# Patient Record
Sex: Male | Born: 1980 | Race: Black or African American | Hispanic: No | Marital: Single | Smoking: Current every day smoker
Health system: Southern US, Community
[De-identification: ages and names within clinical notes are randomized; demographics above are authoritative.]

## PROBLEM LIST (undated history)

## (undated) DIAGNOSIS — I4891 Unspecified atrial fibrillation: Secondary | ICD-10-CM

## (undated) DIAGNOSIS — Z720 Tobacco use: Secondary | ICD-10-CM

## (undated) DIAGNOSIS — F1910 Other psychoactive substance abuse, uncomplicated: Secondary | ICD-10-CM

---

## 2003-01-25 ENCOUNTER — Emergency Department (HOSPITAL_COMMUNITY): Admission: EM | Admit: 2003-01-25 | Discharge: 2003-01-25 | Payer: Self-pay | Admitting: Emergency Medicine

## 2005-10-17 ENCOUNTER — Emergency Department (HOSPITAL_COMMUNITY): Admission: EM | Admit: 2005-10-17 | Discharge: 2005-10-18 | Payer: Self-pay | Admitting: Emergency Medicine

## 2005-10-18 IMAGING — CR DG CHEST 2V
2 series · 2 of 2 positions shown · non-contrast
Comparison: None.

CLINICAL DATA: Epigastric pain for 5 days.  
 CHEST - 2 VIEW:

[view not recorded (1 of 2)]
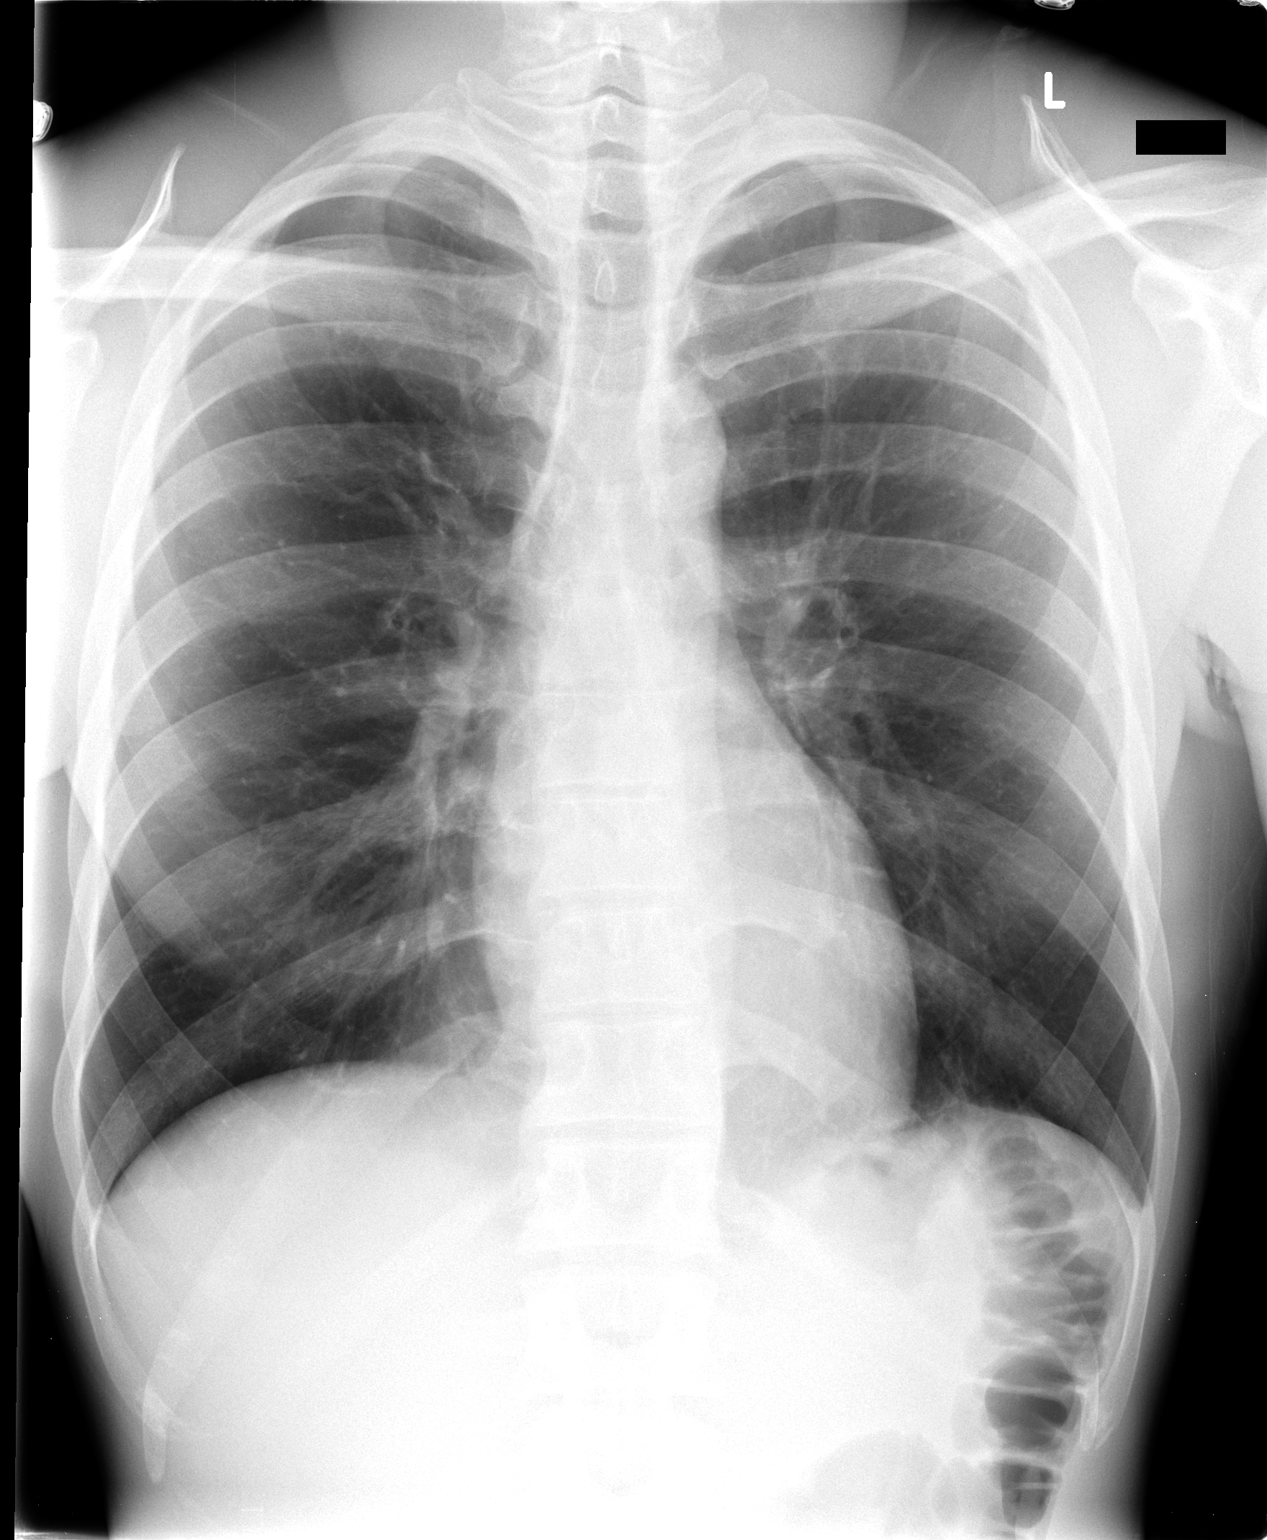

[view not recorded (2 of 2)]
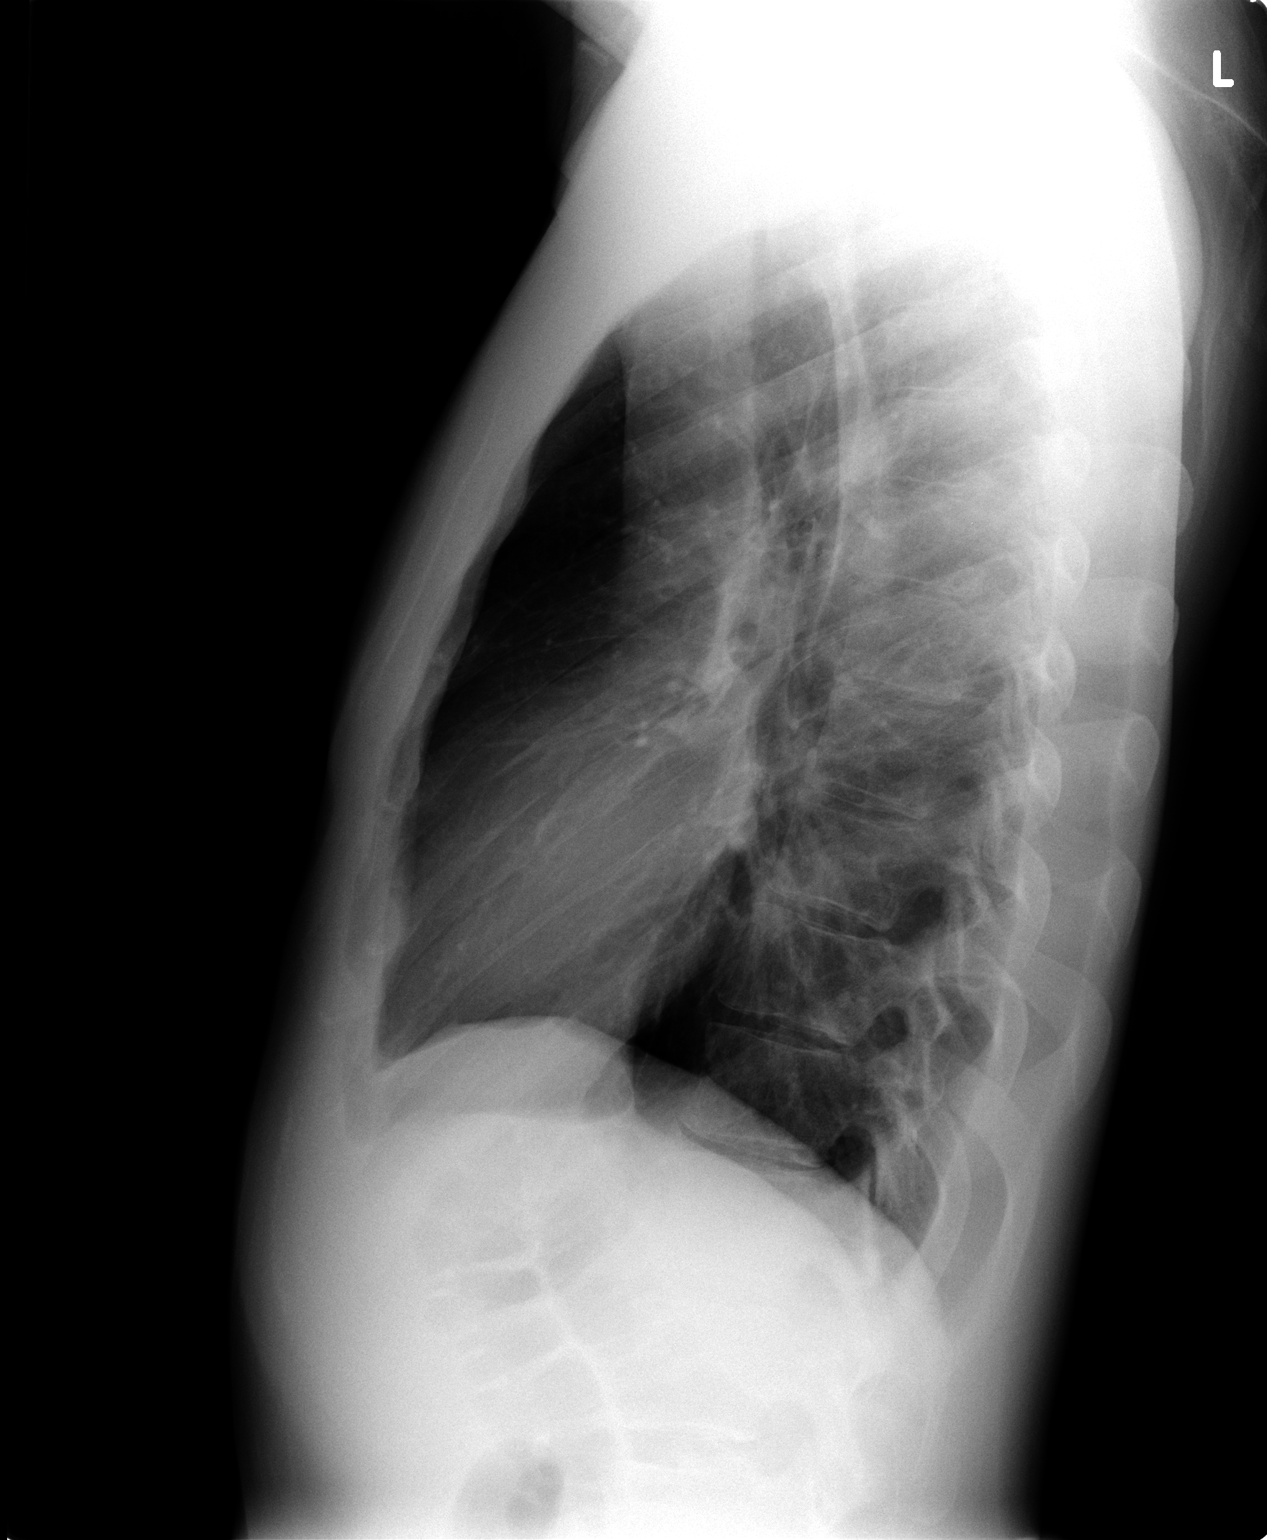

[2 of 2 positions shown; findings below may reference images not displayed]

FINDINGS: Midline trachea.  Heart size normal.  Mediastinal contours unremarkable.  Lungs are clear.  There is no pleural fluid.  No free intraperitoneal air.
IMPRESSION: No acute cardiopulmonary disease.

## 2005-10-18 IMAGING — US US ABDOMEN COMPLETE
1 series · 14 of 25 positions shown · non-contrast
Comparison: None.

CLINICAL DATA: Abdominal pain. 
 ABDOMEN ULTRASOUND:
TECHNIQUE: Complete abdominal ultrasound examination was performed including evaluation of the liver, gallbladder, bile ducts, pancreas, kidneys, spleen, IVC, and abdominal aorta.

[Series 1: abdomen · 0.27mm/px · 14 of 77 slices shown]
[im 1/77]
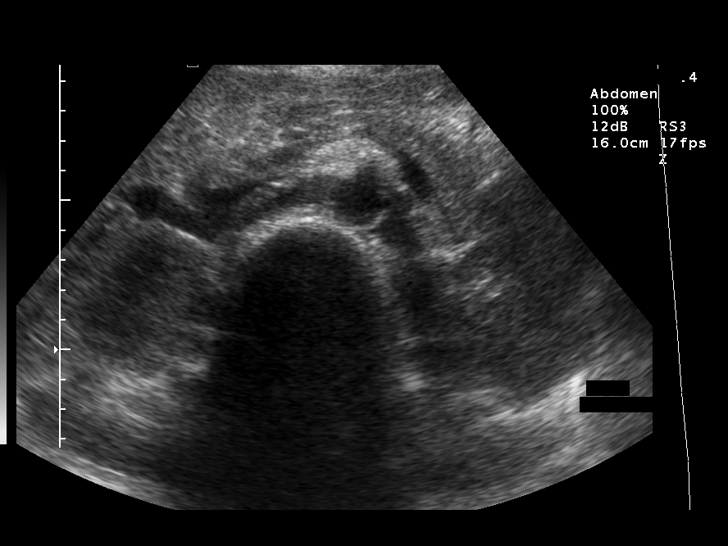
[im 7/77]
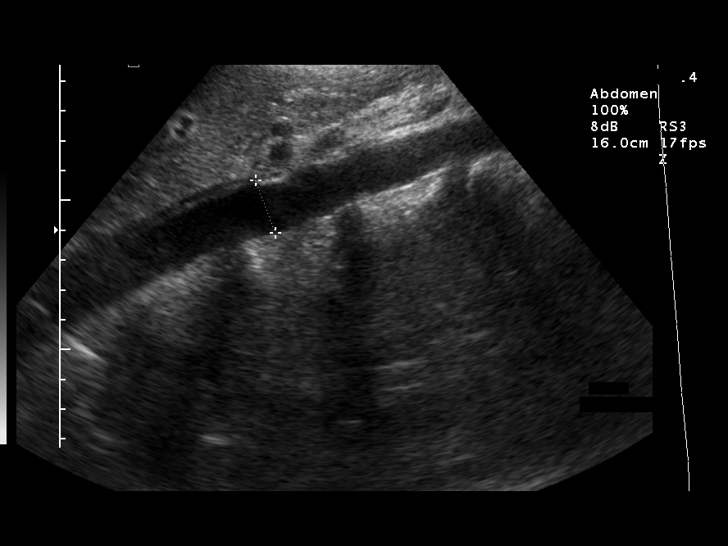
[im 13/77]
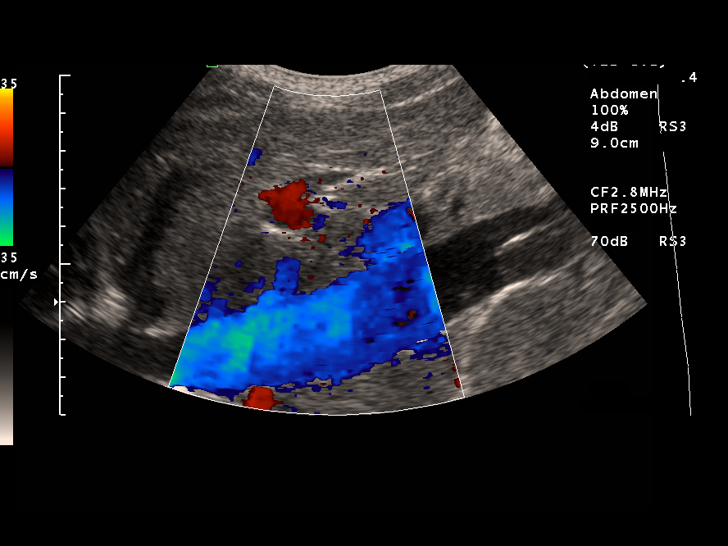
[im 20/77]
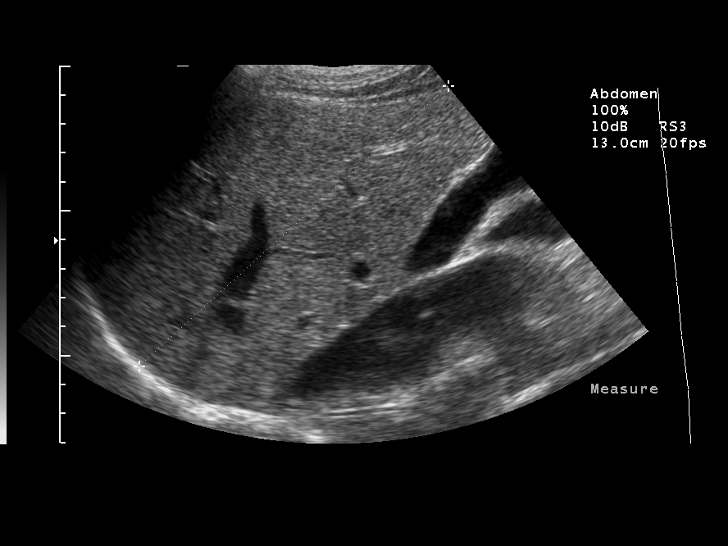
[im 26/77]
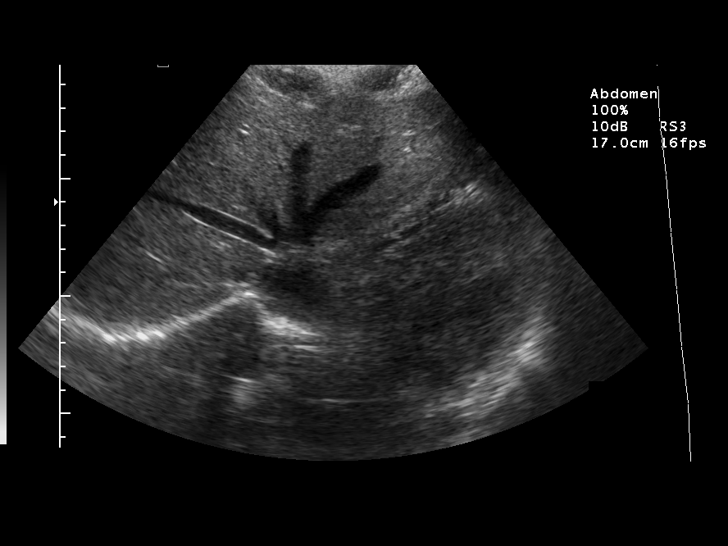
[im 29/77]
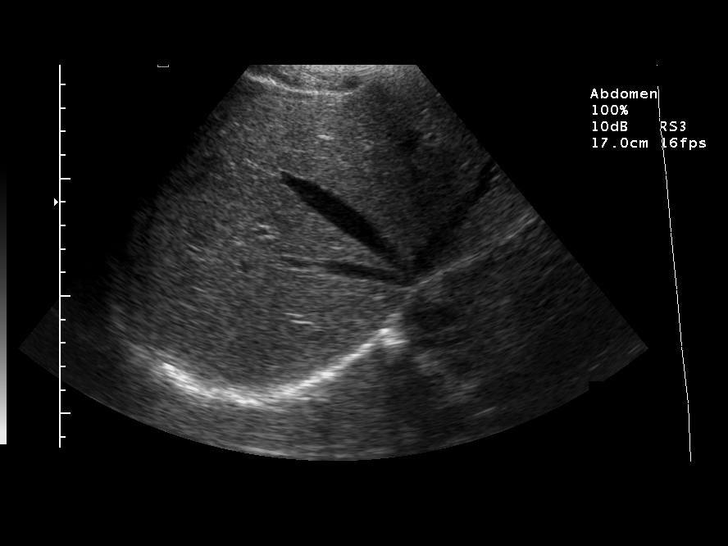
[im 35/77]
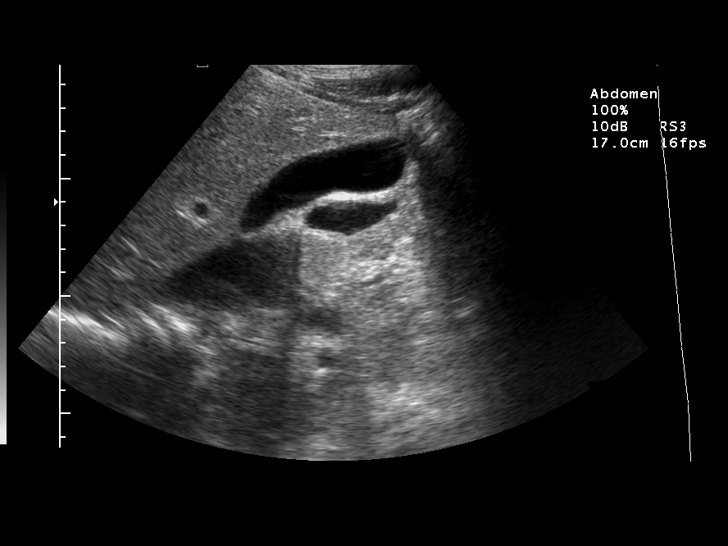
[im 42/77]
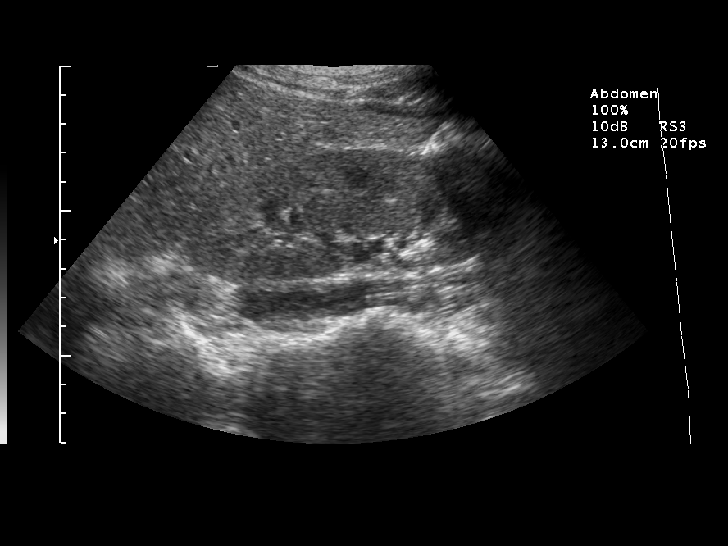
[im 48/77]
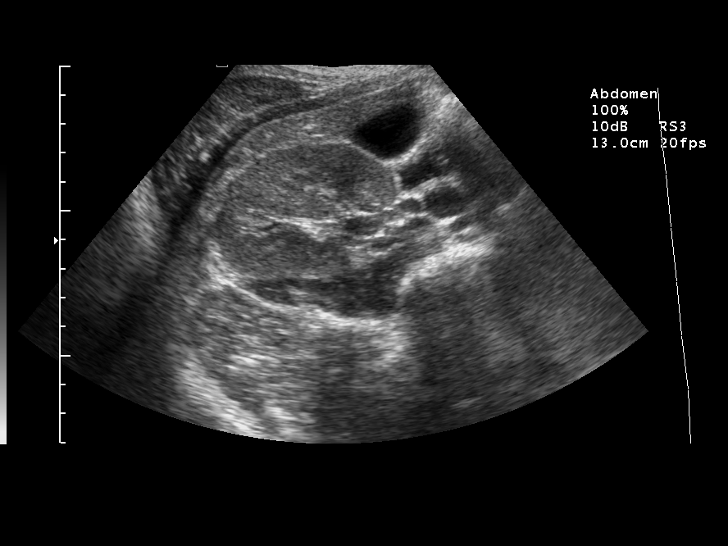
[im 51/77]
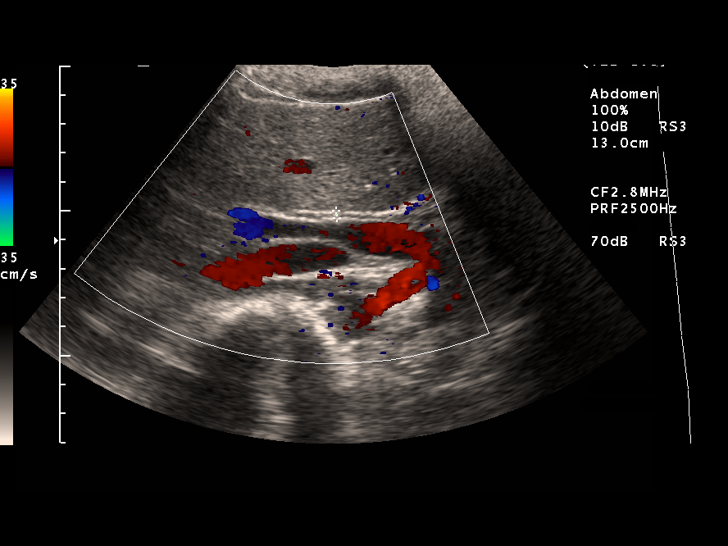
[im 58/77]
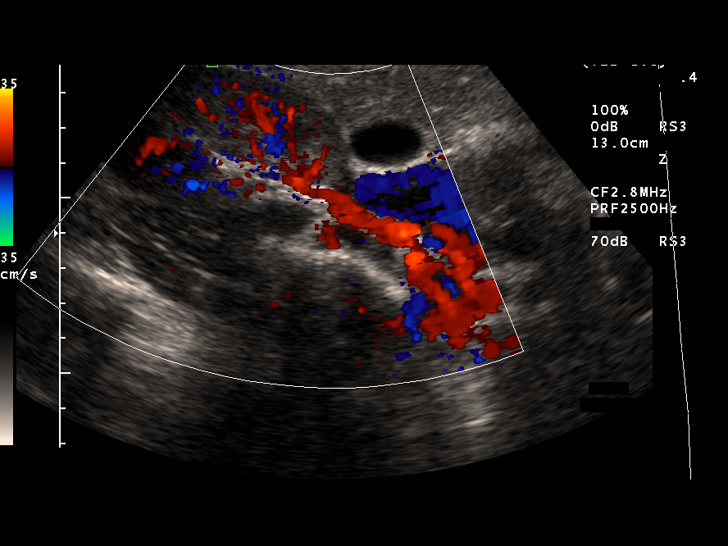
[im 64/77]
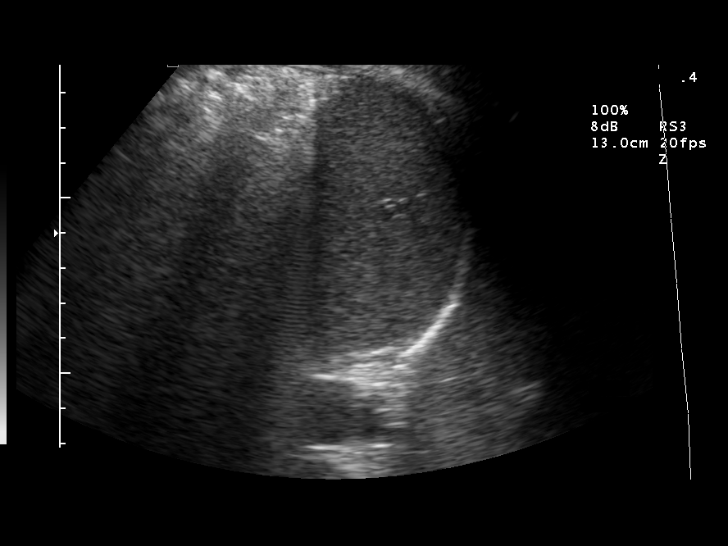
[im 70/77]
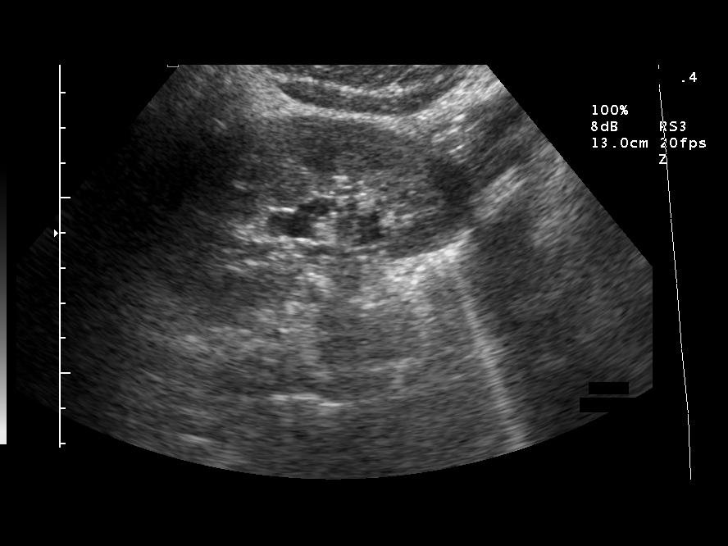
[im 77/77]
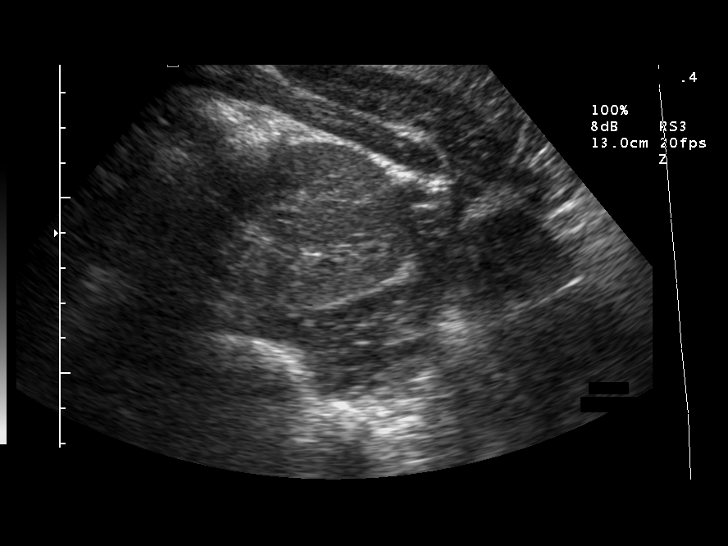

[14 of 25 positions shown; findings below may reference images not displayed]

FINDINGS: There is no evidence of gallstones or biliary ductal dilatation.  The liver is within normal limits in echogenicity, and no focal liver lesions are seen.  The visualized portions of the IVC and pancreas are unremarkable.
 There is no evidence of splenomegaly.  The kidneys are unremarkable, and there is no evidence of hydronephrosis.  The abdominal aorta is non-dilated.
IMPRESSION: Negative abdominal ultrasound.

## 2007-01-11 ENCOUNTER — Emergency Department (HOSPITAL_COMMUNITY): Admission: EM | Admit: 2007-01-11 | Discharge: 2007-01-11 | Payer: Self-pay | Admitting: Emergency Medicine

## 2007-01-11 IMAGING — CR DG CHEST 2V
2 series · 2 of 2 positions shown · non-contrast
Comparison: [DATE].

CLINICAL DATA: Left chest pain. 
 CHEST - 2 VIEW:

[view not recorded (1 of 2)]
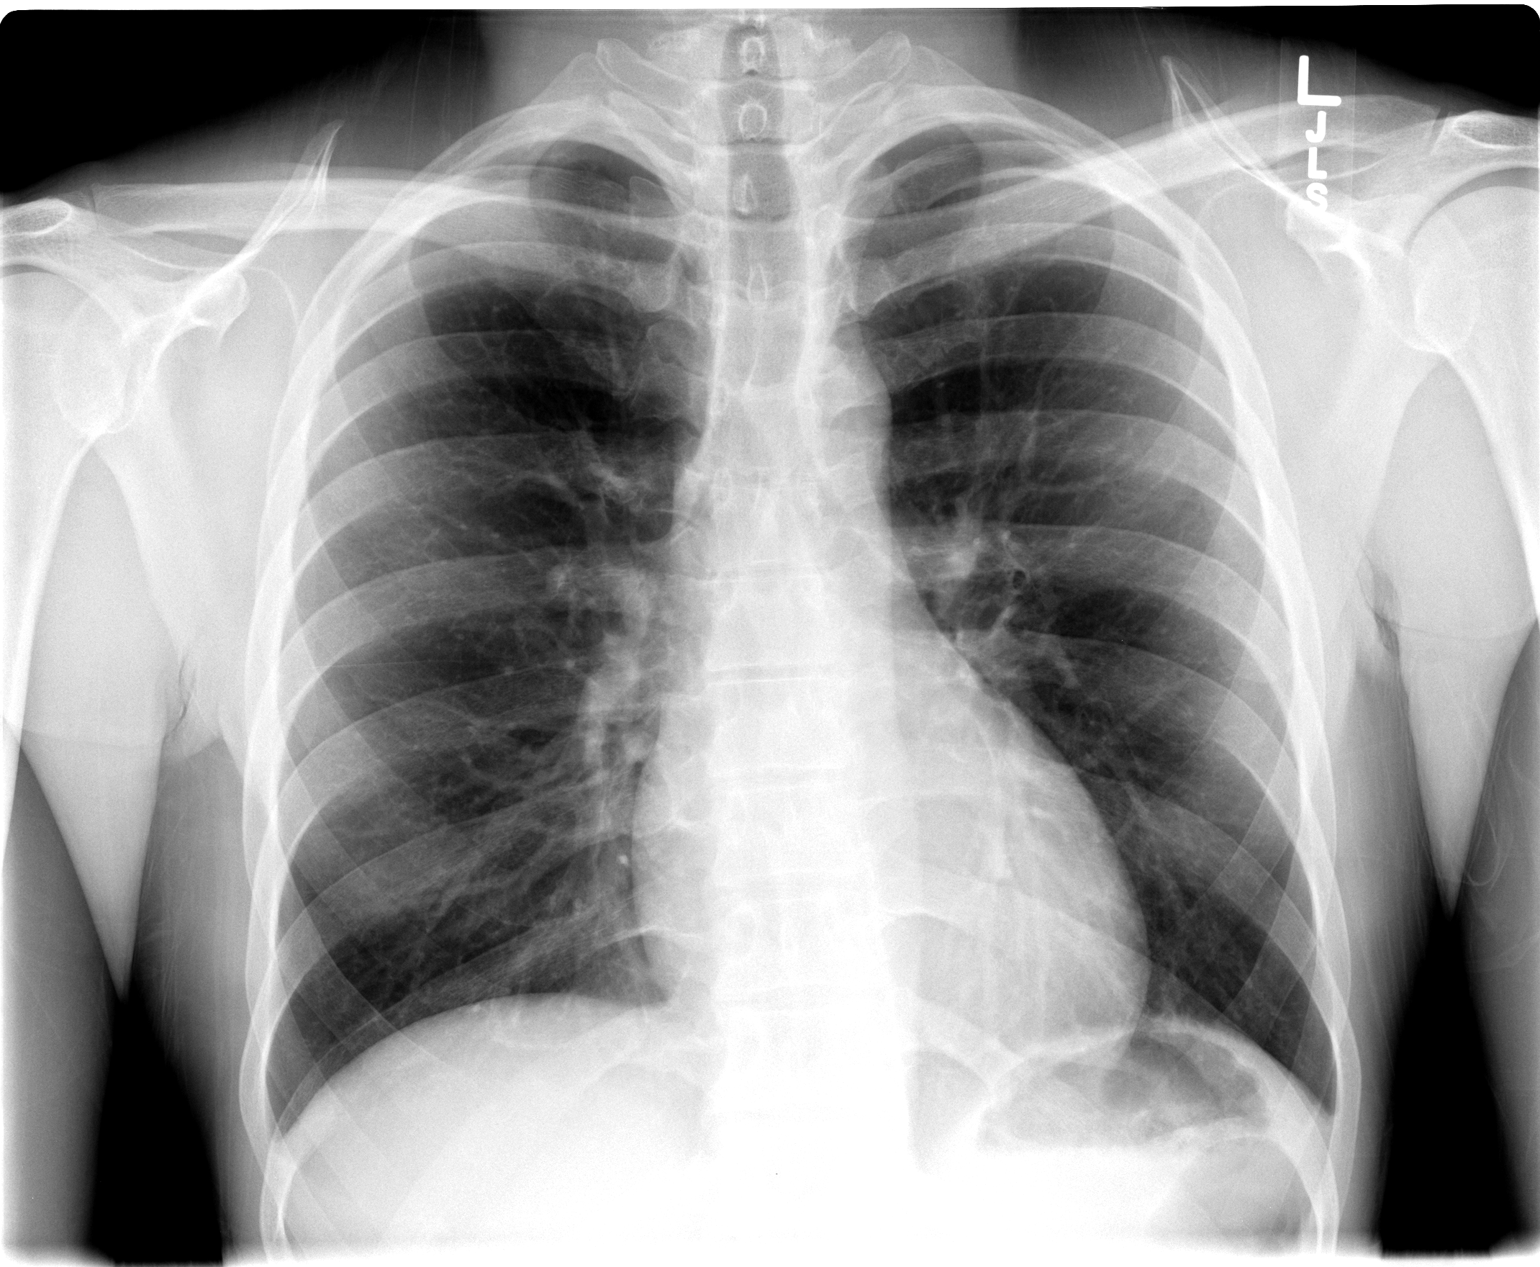

[view not recorded (2 of 2)]
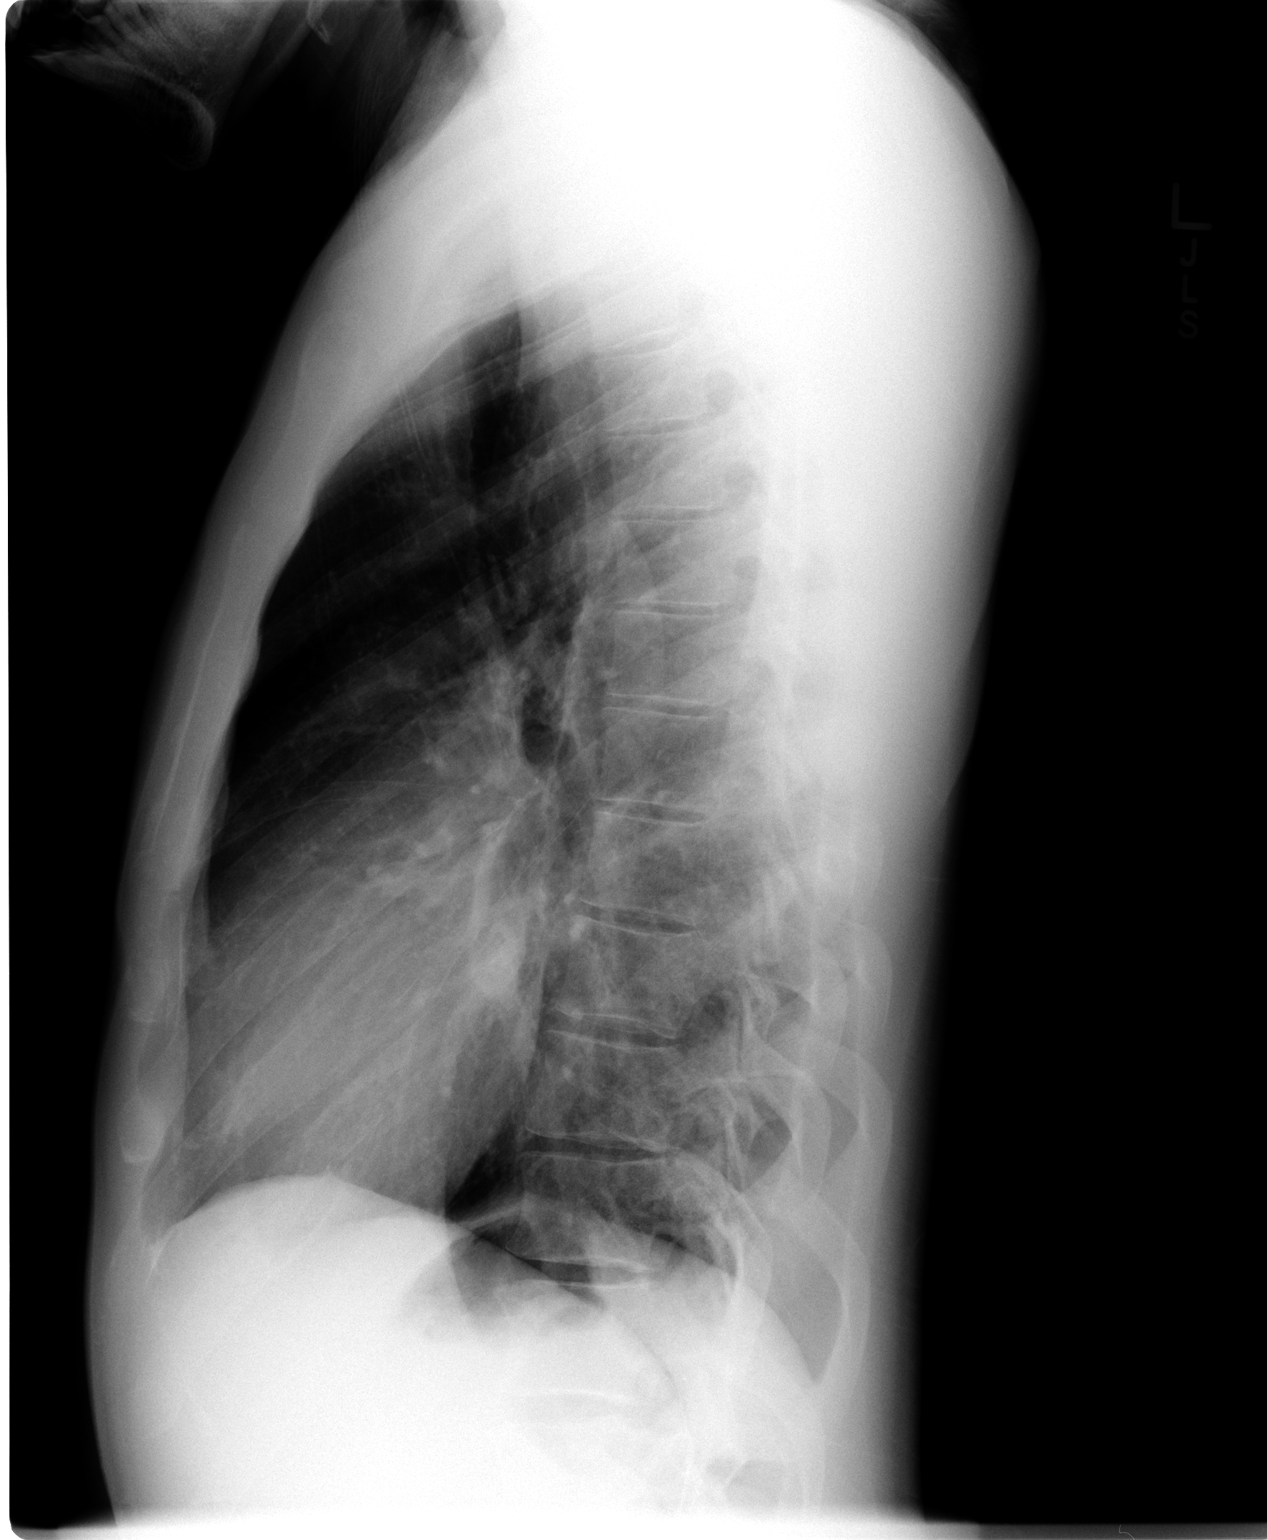

[2 of 2 positions shown; findings below may reference images not displayed]

FINDINGS: Heart and vascularity normal. There is mild peribronchial thickening without active airspace disease or pleural fluid.  No change since the prior study.
IMPRESSION: Peribronchial thickening ? no active disease.

## 2007-06-27 ENCOUNTER — Emergency Department (HOSPITAL_COMMUNITY): Admission: EM | Admit: 2007-06-27 | Discharge: 2007-06-27 | Payer: Self-pay | Admitting: Emergency Medicine

## 2007-06-27 ENCOUNTER — Ambulatory Visit (HOSPITAL_COMMUNITY): Admission: RE | Admit: 2007-06-27 | Discharge: 2007-06-27 | Payer: Self-pay | Admitting: Emergency Medicine

## 2007-06-27 IMAGING — CR DG SKULL 1-3V
2 series · 2 of 2 positions shown · non-contrast
Comparison: No priors for comparison.

CLINICAL DATA: Focal left frontal head pain.  No injury. 
 SKULL ? 2 VIEW:

[view not recorded (1 of 2)]
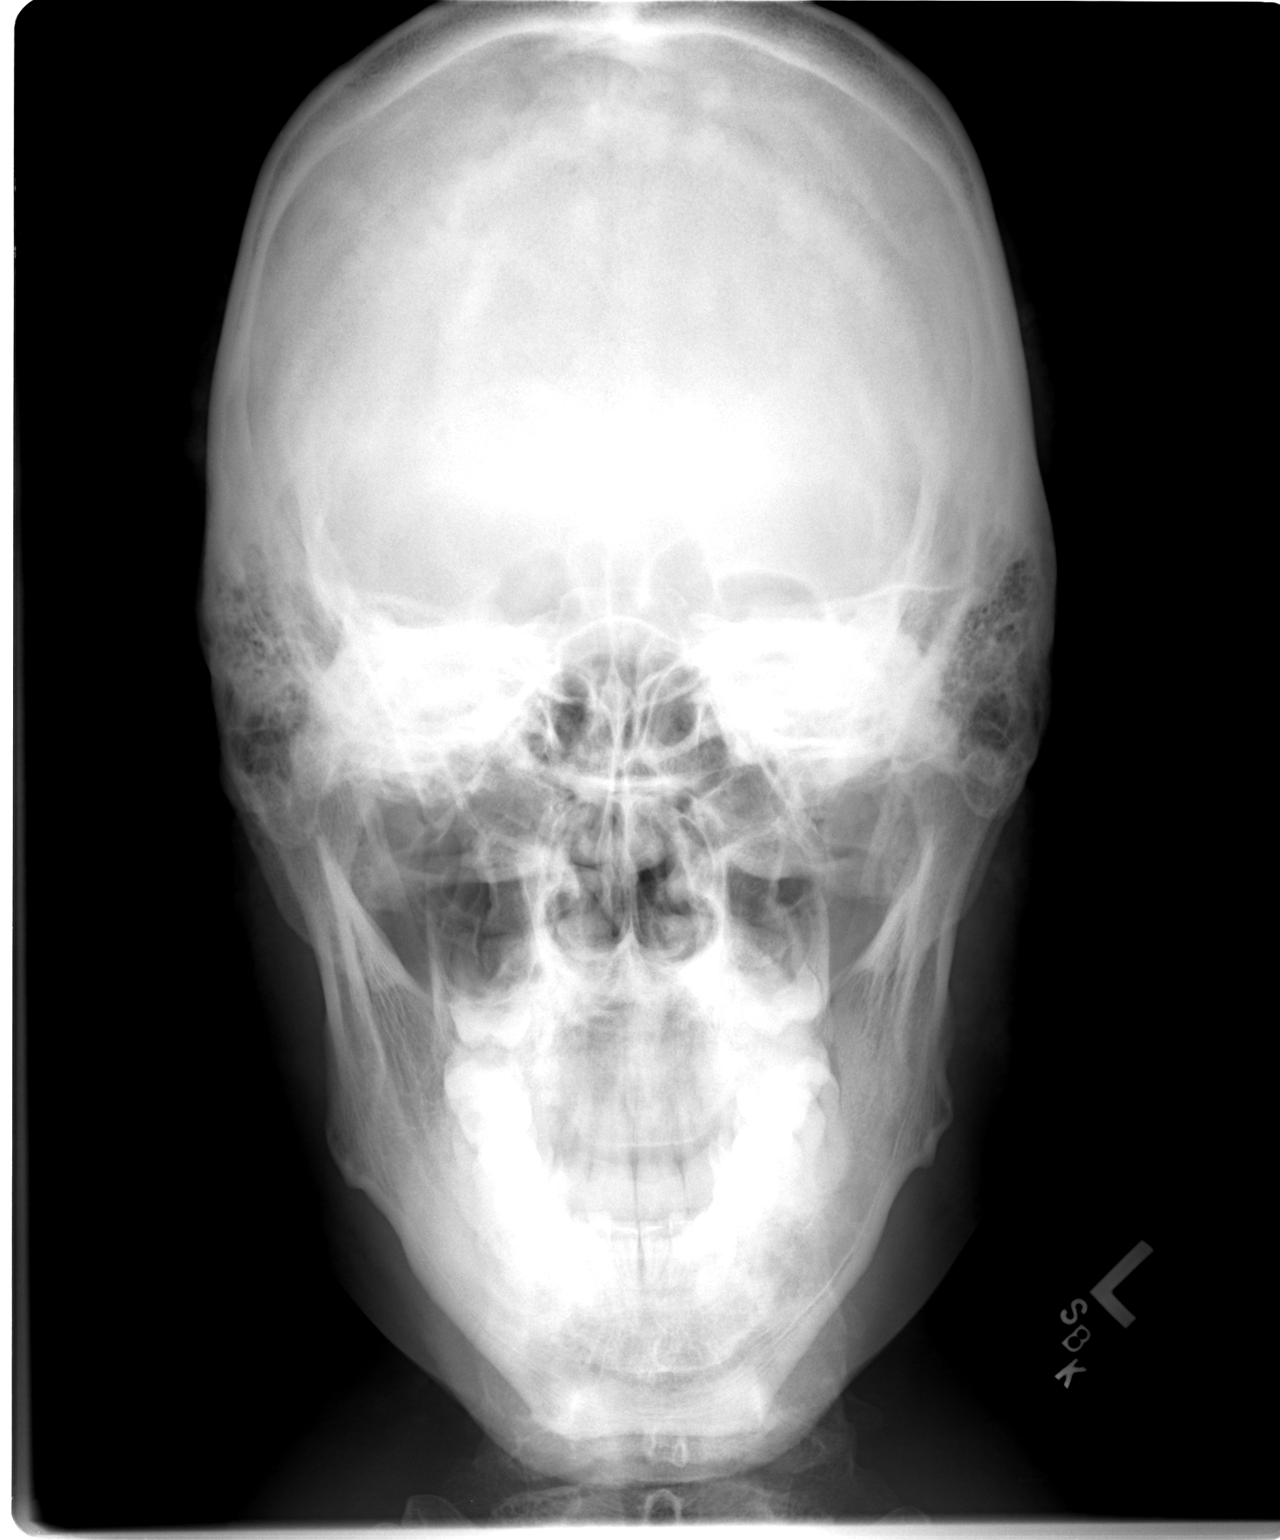

[view not recorded (2 of 2)]
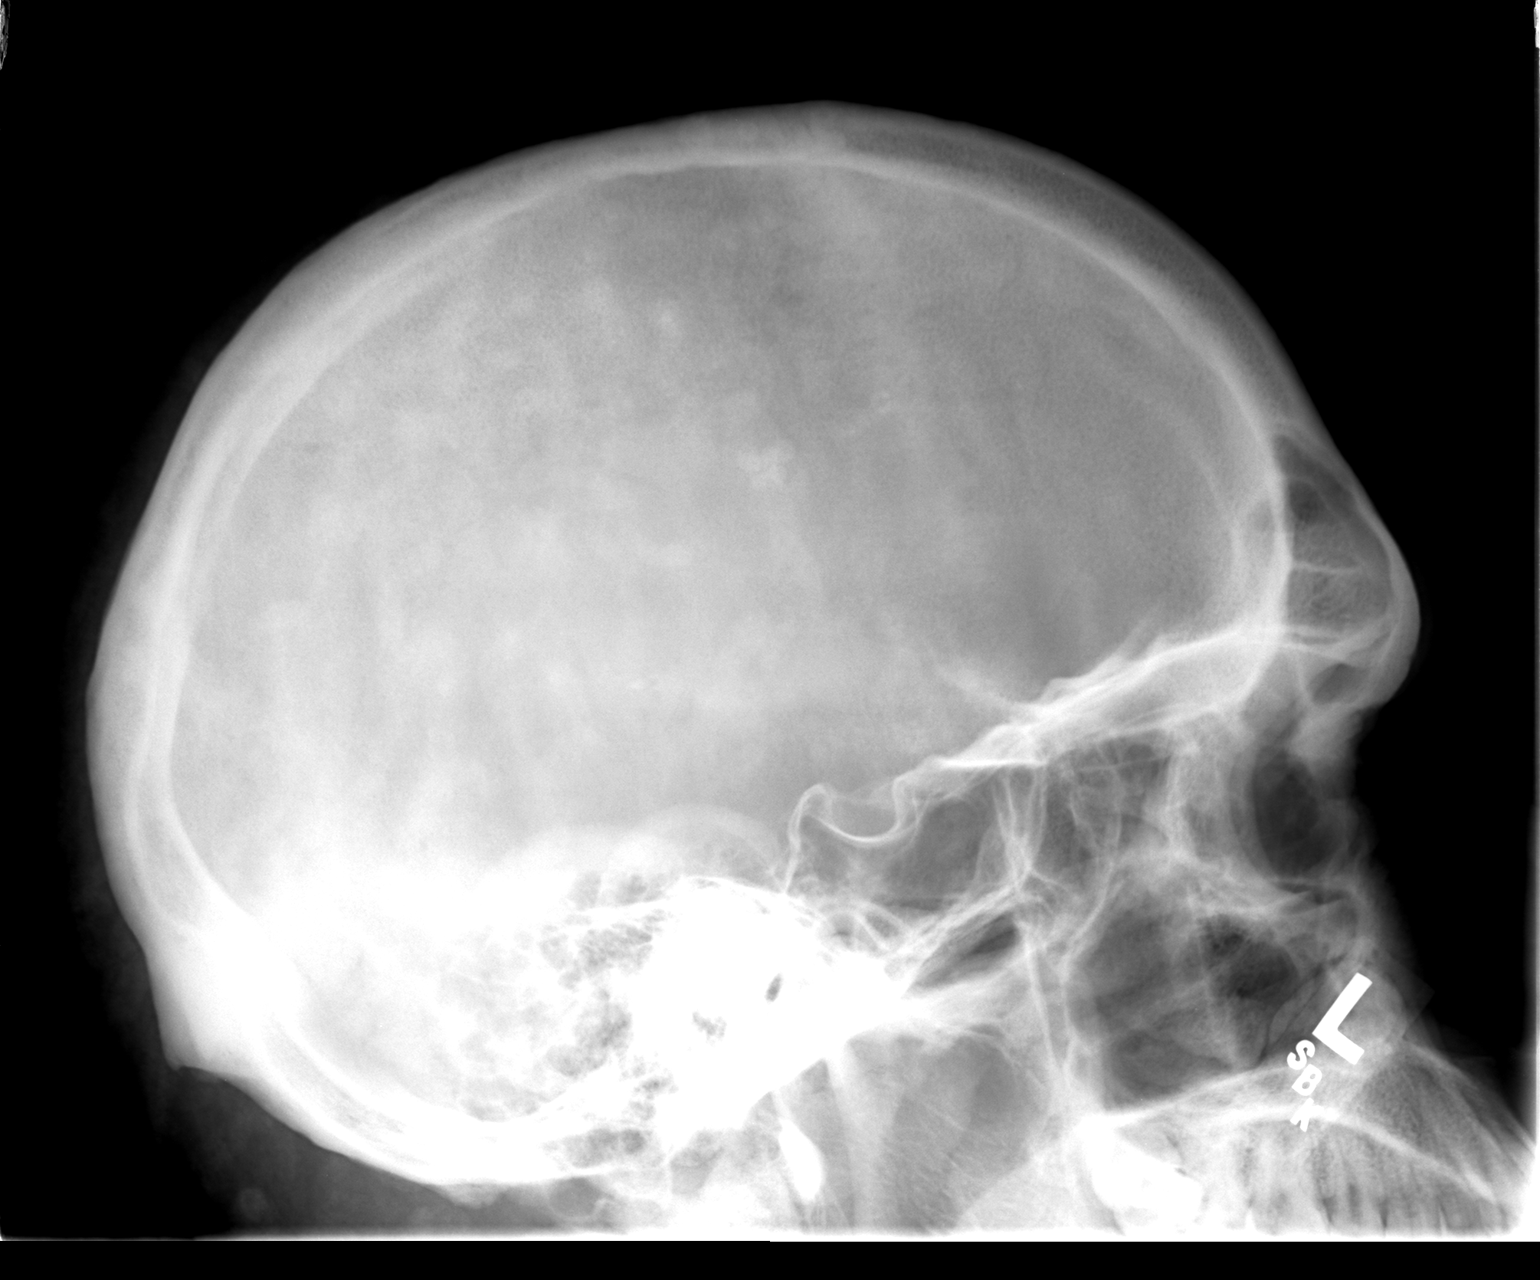

[2 of 2 positions shown; findings below may reference images not displayed]

FINDINGS: On the lateral view, there is increased density in the inferior aspect of the frontal sinus with suggestion of an air fluid level.  Maxillary and sphenoid sinuses appear aerated, without evidence of air fluid level.
IMPRESSION: Findings suspicious for air fluid level in the frontal sinuses, and acute sinusitis is suspected.  Noncontrast head CT is suggested for further evaluation.  Findings and recommendations were discussed with Dr. JIM on [DATE].

## 2007-06-27 IMAGING — CT CT HEAD W/O CM
1 series · 15 of 30 positions shown, 19 images · IV contrast (agent unspecified)
Comparison: Skull radiographs earlier the same day.

CLINICAL DATA: Head pain.  Left frontal headache for 10 days without injury.  
 HEAD CT WITHOUT CONTRAST:
TECHNIQUE: Contiguous axial images were obtained from the base of the skull through the vertex according to standard protocol without contrast.

[Series 2: brain · axial · 0.47mm/px · z∈[+140,+287]mm · 15 of 32 slices shown, 19 images]
[im 2/32  brain]
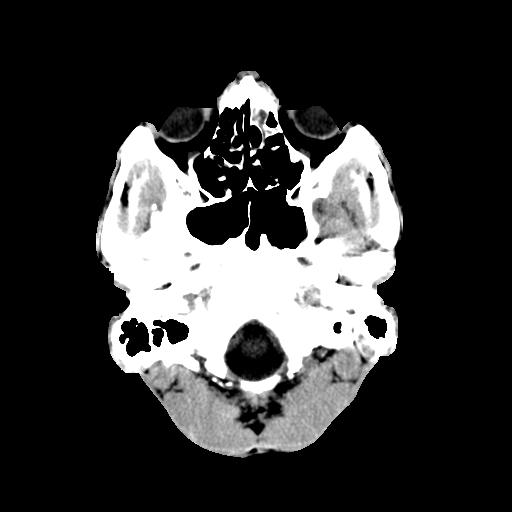
[im 2/32  bone]
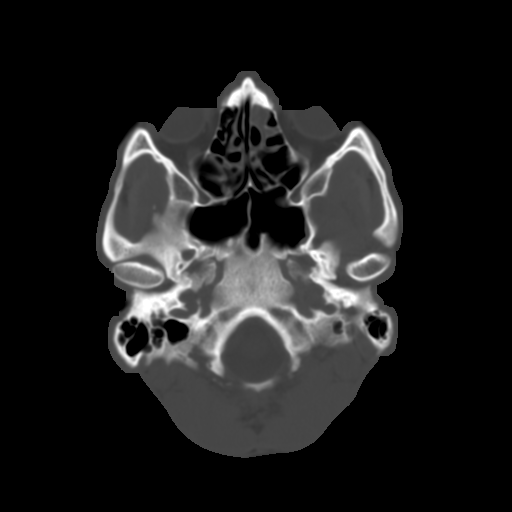
[im 4/32  brain]
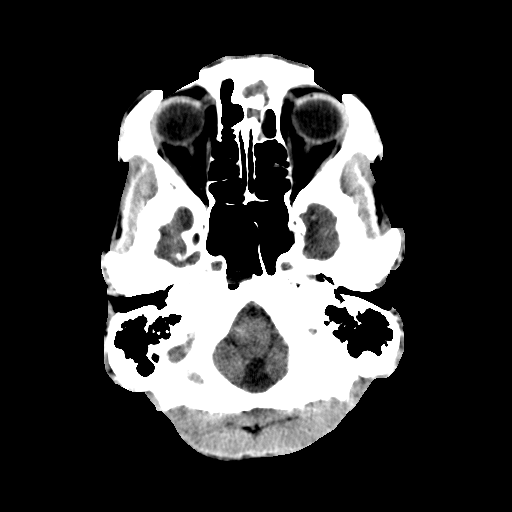
[im 6/32  brain]
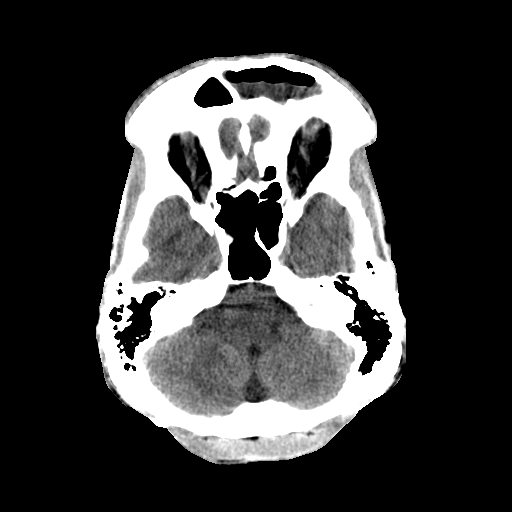
[im 8/32  brain]
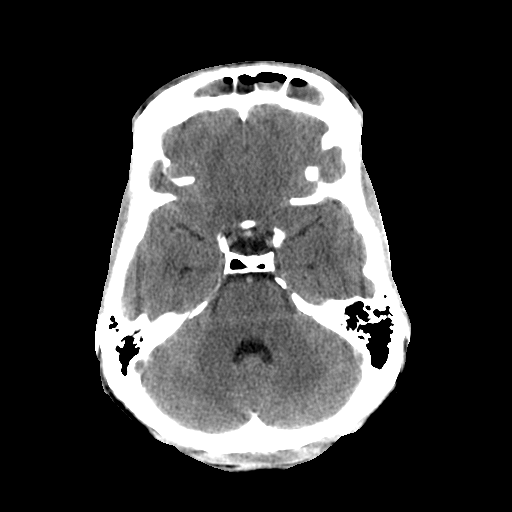
[im 10/32  brain]
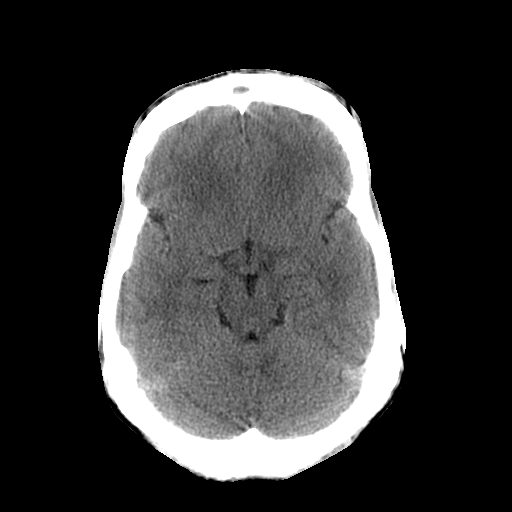
[im 10/32  bone]
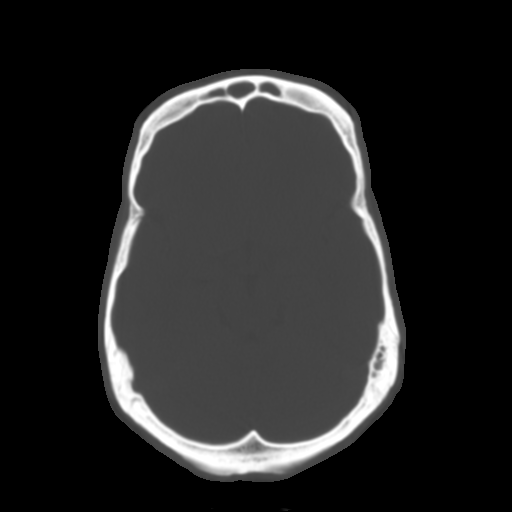
[im 12/32  brain]
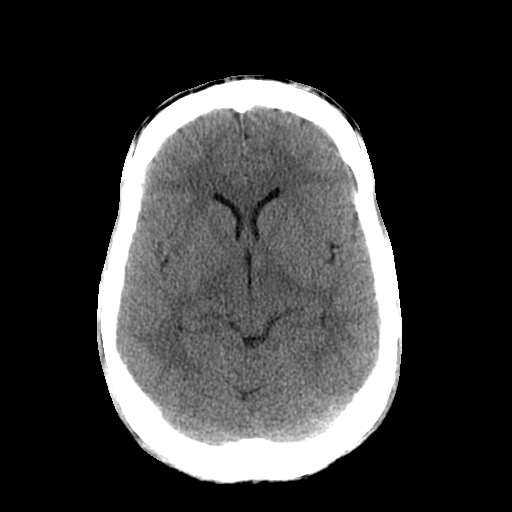
[im 14/32  brain]
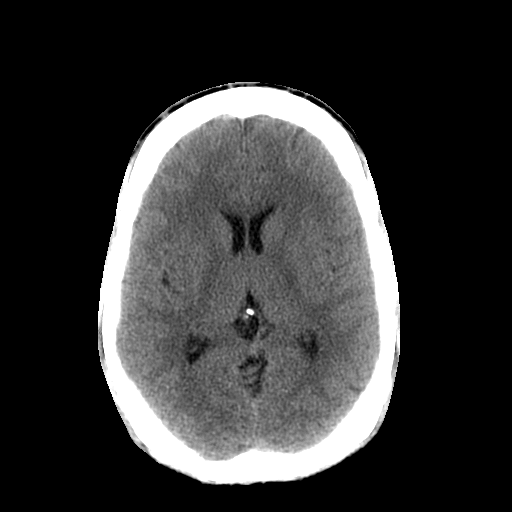
[im 17/32  brain]
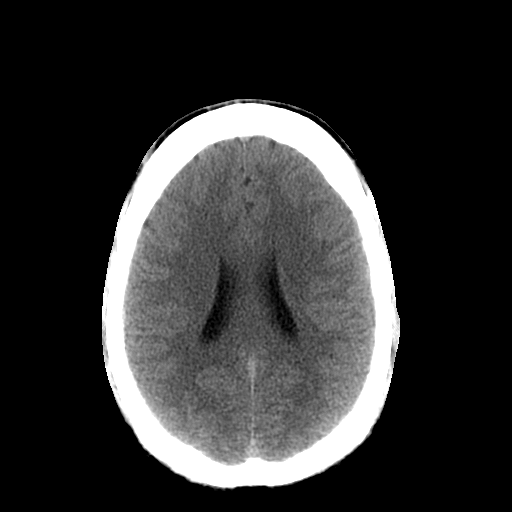
[im 18/32  brain]
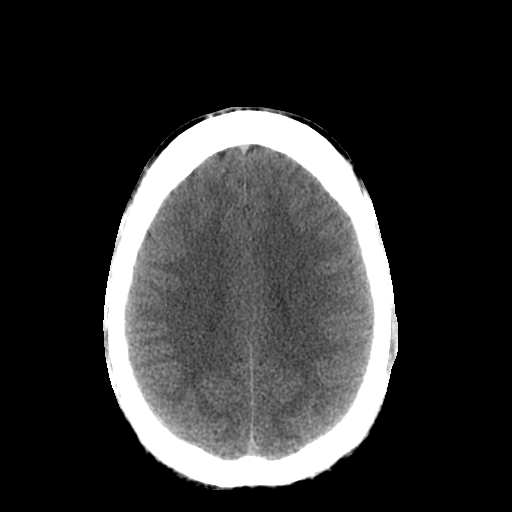
[im 18/32  bone]
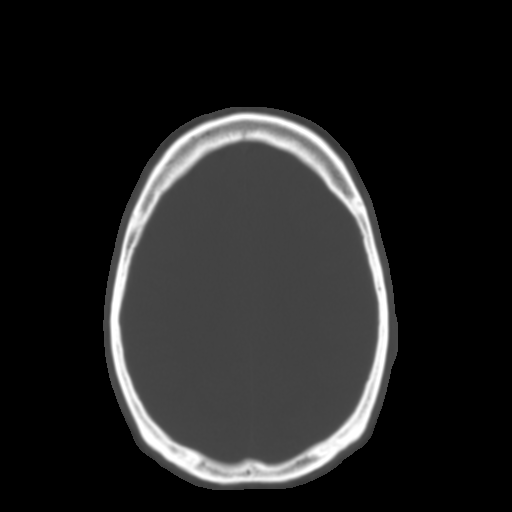
[im 20/32  brain]
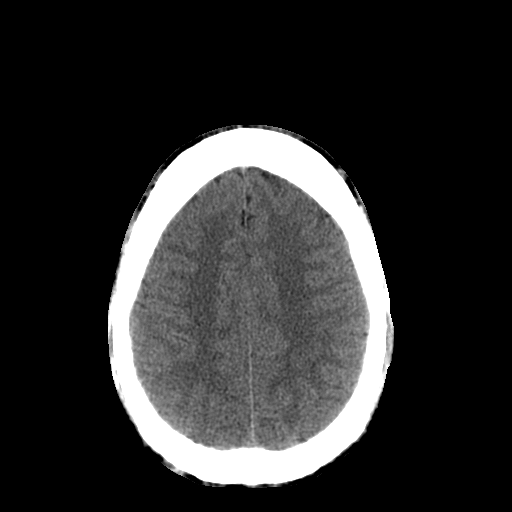
[im 22/32  brain]
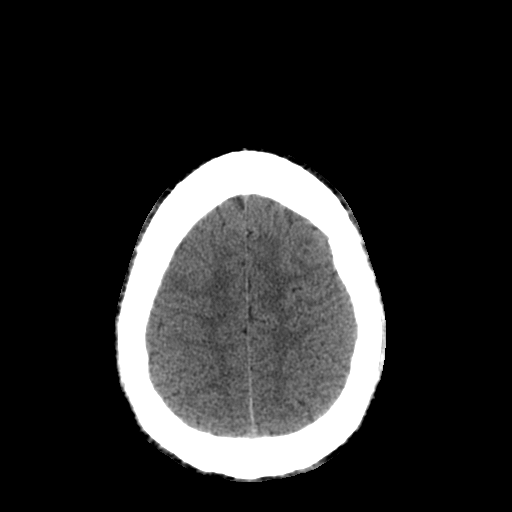
[im 24/32  brain]
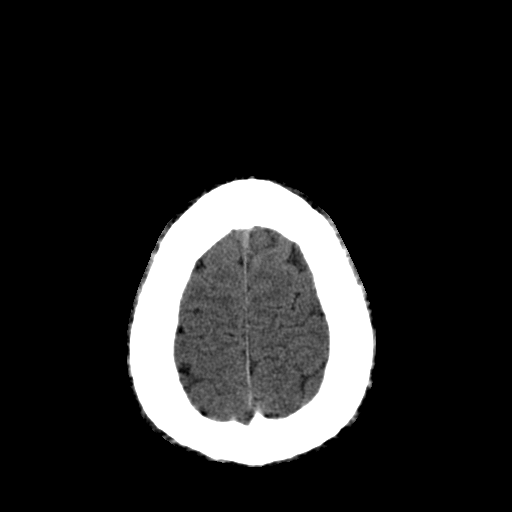
[im 26/32  brain]
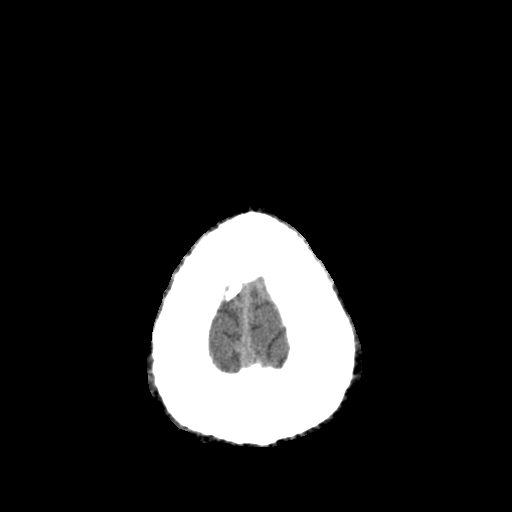
[im 26/32  bone]
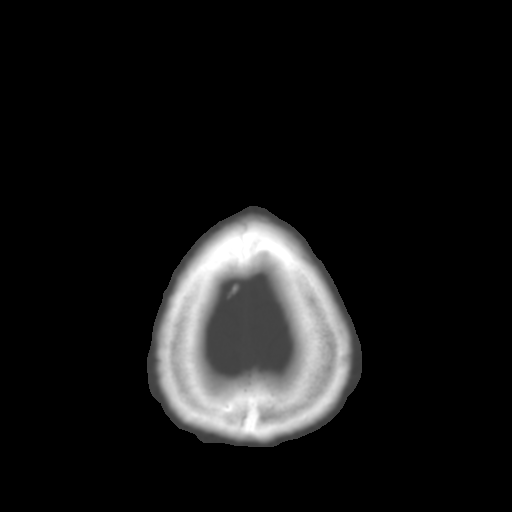
[im 28/32  brain]
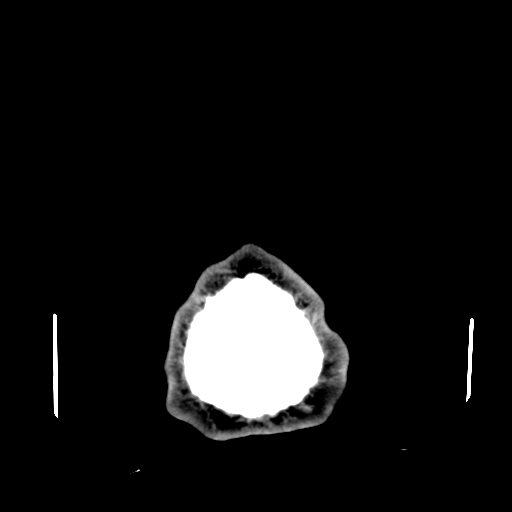
[im 30/32  brain]
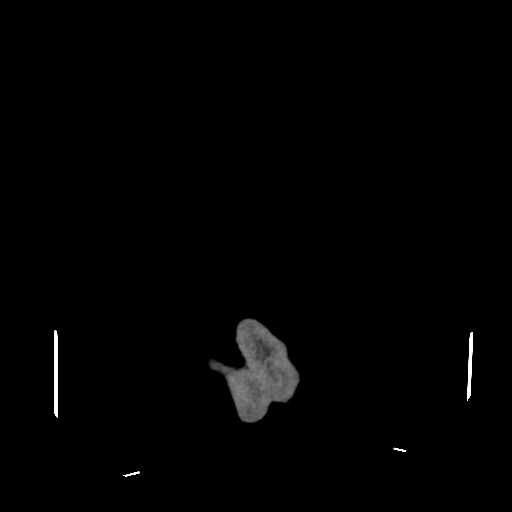

[15 of 30 positions shown; findings below may reference images not displayed]

FINDINGS: There is a large amount of fluid within the frontal sinus, with an air fluid level, corresponding to the abnormality seen on the recent skull radiographs.  There is some mucosal thickening and/or fluid in the left anterior ethmoid air cells.  1.5 cm soft tissue opacity in the posterior aspect of the right sphenoid sinus likely represents mucosal thickening/secretions. 
 No evidence of acute intracranial abnormality.  Specifically, no intra or extraaxial hemorrhage, mass effect, hydrocephalus, or acute ischemia.  The mastoid air cells are clear.
IMPRESSION: 1.  Acute sinusitis involving the frontal and left anterior ethmoid sinuses.  Findings were called to [facility] at [HOSPITAL], [DATE] p.m. on [DATE].  
 2.  No evidence of acute intracranial abnormality.

## 2007-08-06 IMAGING — CR DG CHEST 2V
2 series · 2 of 2 positions shown · non-contrast
Comparison: [DATE]

CLINICAL DATA: Fever and cough

CHEST - 2 VIEW

[w chest pa]
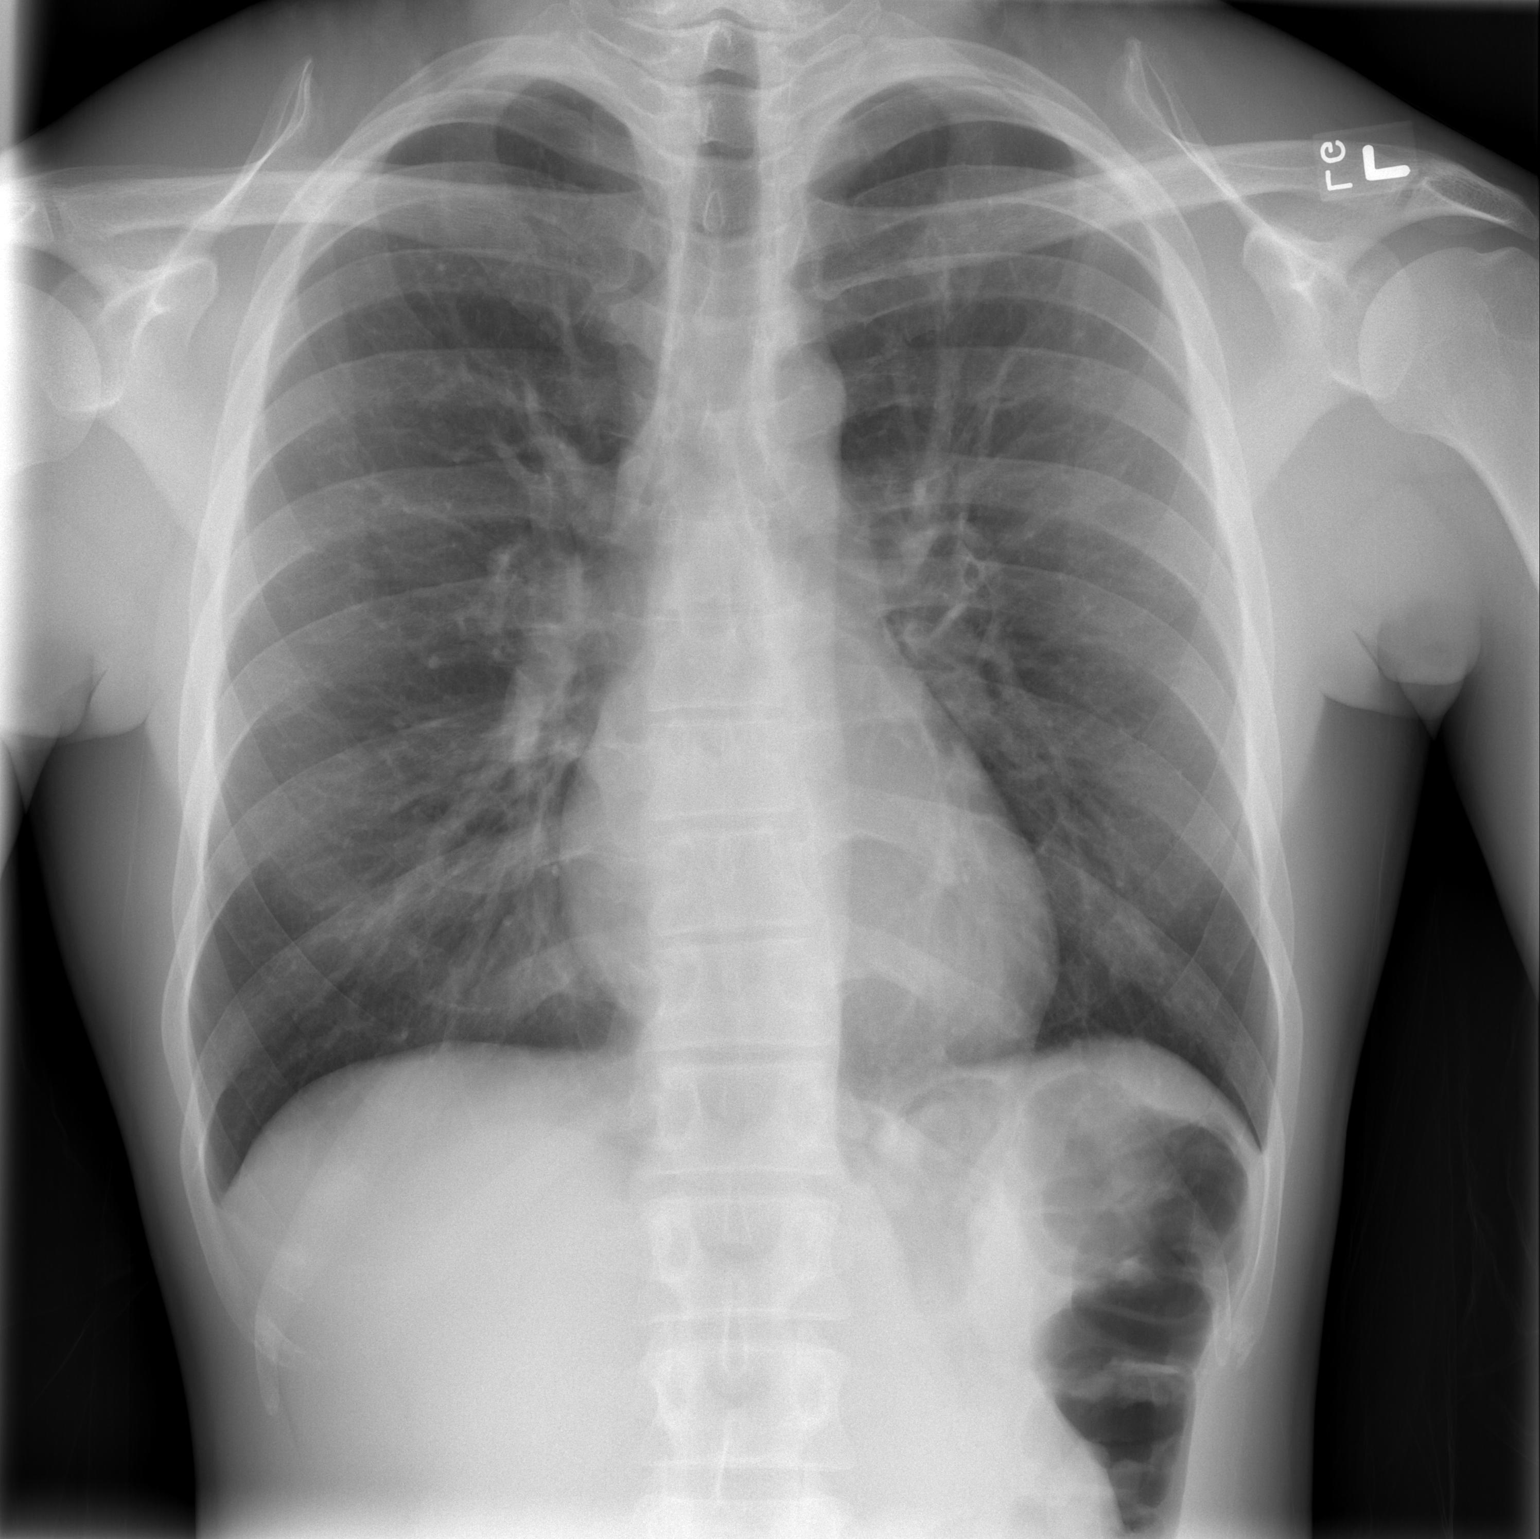

[w chest lat]
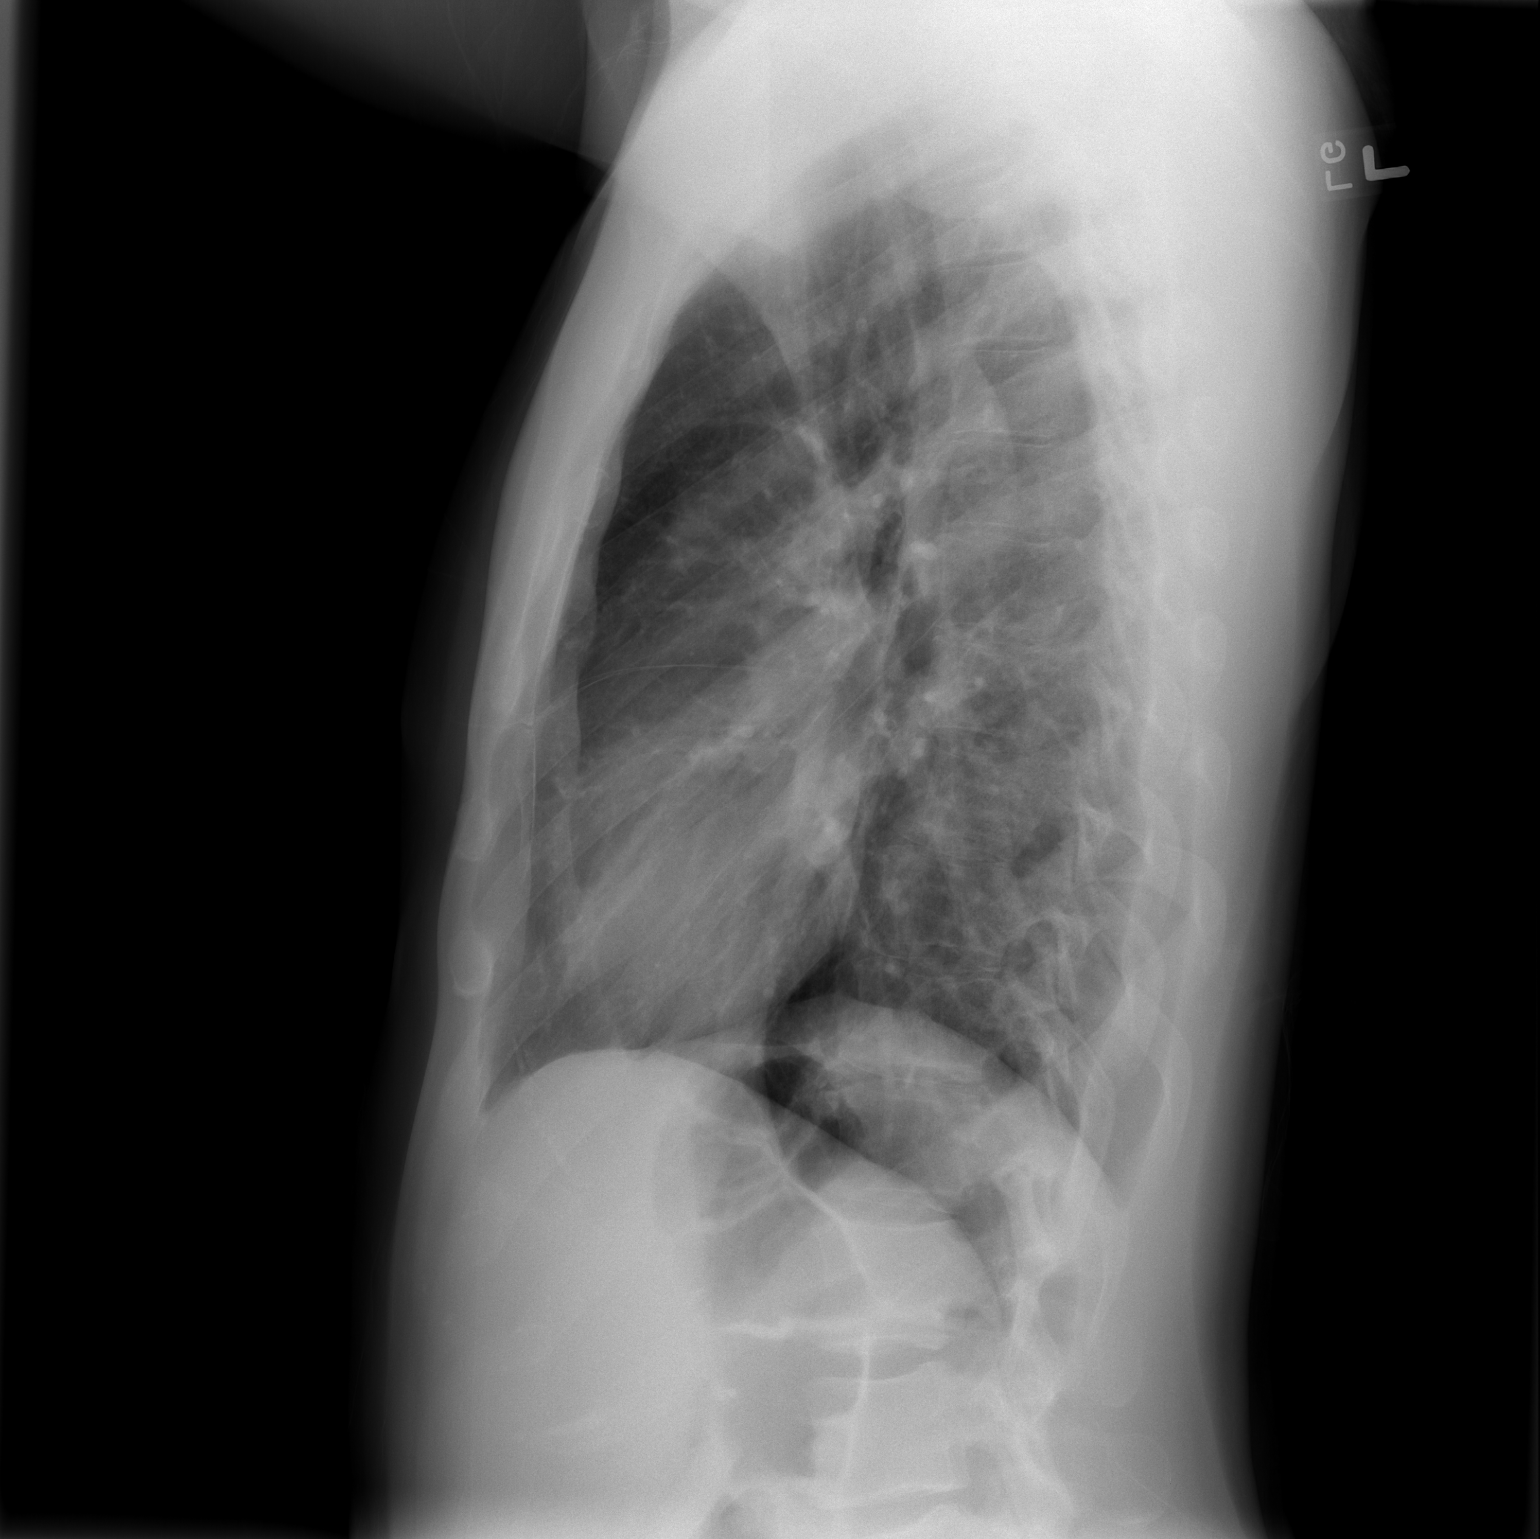

[2 of 2 positions shown; findings below may reference images not displayed]

FINDINGS: Cardiomediastinal silhouette is unremarkable.
The lungs are clear.
No evidence of focal airspace disease, pleural effusions, or
pneumothorax.
The bony thorax and upper abdomen are within normal limits.
IMPRESSION: No evidence of acute cardiopulmonary disease.

## 2008-01-05 ENCOUNTER — Emergency Department (HOSPITAL_COMMUNITY): Admission: EM | Admit: 2008-01-05 | Discharge: 2008-01-05 | Payer: Self-pay | Admitting: Emergency Medicine

## 2008-08-30 ENCOUNTER — Emergency Department (HOSPITAL_COMMUNITY): Admission: EM | Admit: 2008-08-30 | Discharge: 2008-08-30 | Payer: Self-pay | Admitting: Family Medicine

## 2010-10-09 LAB — POCT H PYLORI SCREEN: H. PYLORI SCREEN, POC: POSITIVE — AB

## 2016-10-06 ENCOUNTER — Encounter (HOSPITAL_COMMUNITY): Payer: Self-pay

## 2016-10-06 ENCOUNTER — Emergency Department (HOSPITAL_COMMUNITY): Payer: Self-pay

## 2016-10-06 ENCOUNTER — Emergency Department (HOSPITAL_COMMUNITY)
Admission: EM | Admit: 2016-10-06 | Discharge: 2016-10-06 | Disposition: A | Discharge status: Home / Self Care | Payer: Self-pay | Attending: Emergency Medicine | Admitting: Emergency Medicine

## 2016-10-06 DIAGNOSIS — F17200 Nicotine dependence, unspecified, uncomplicated: Secondary | ICD-10-CM | POA: Insufficient documentation

## 2016-10-06 DIAGNOSIS — J00 Acute nasopharyngitis [common cold]: Secondary | ICD-10-CM | POA: Insufficient documentation

## 2016-10-06 IMAGING — DX DG CHEST 2V
2 series · 2 of 2 positions shown · non-contrast
Comparison: PA and lateral chest x-ray [DATE]

CLINICAL DATA: Nonproductive cough for 3 weeks, current smoker.

EXAM:
CHEST  2 VIEW

[w chest pa]
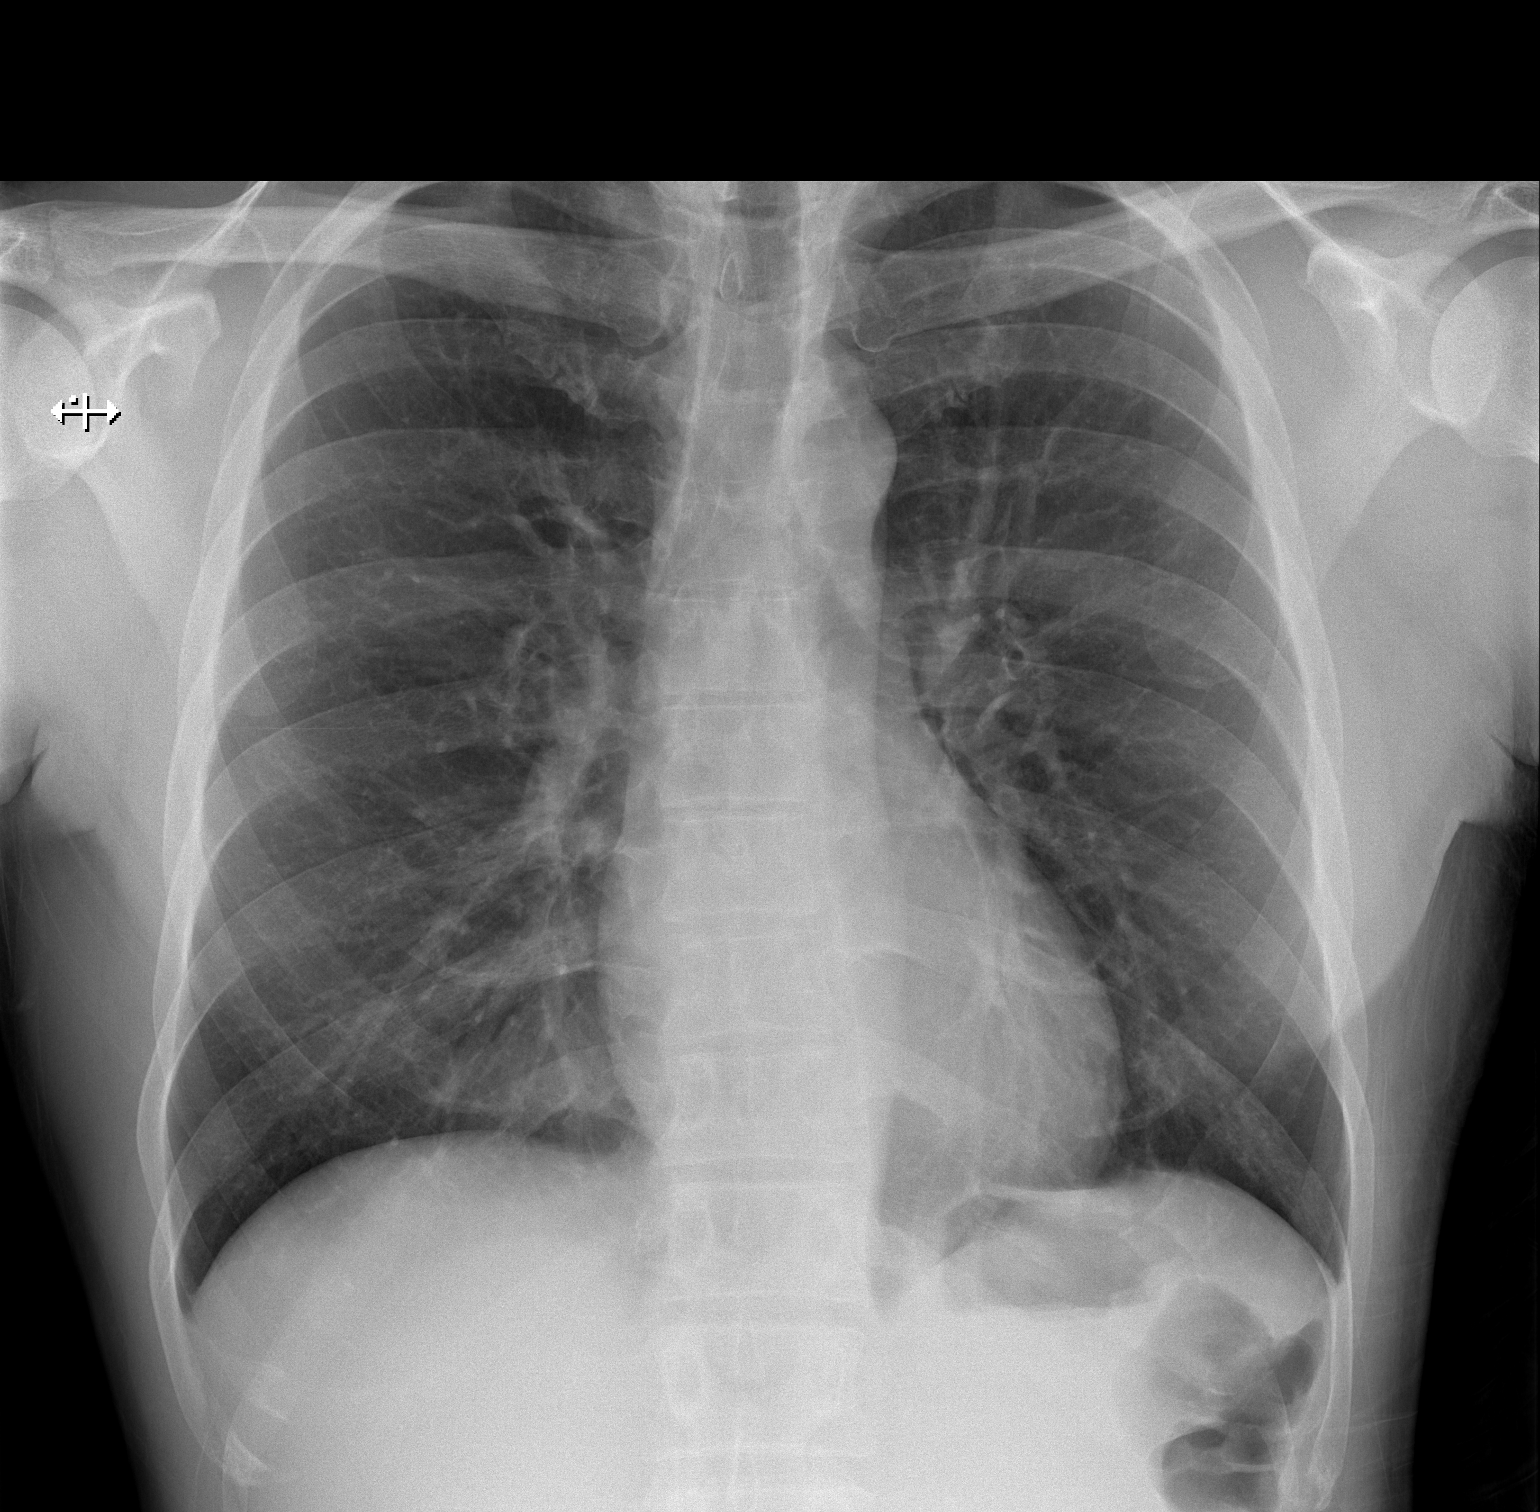

[w chest lat]
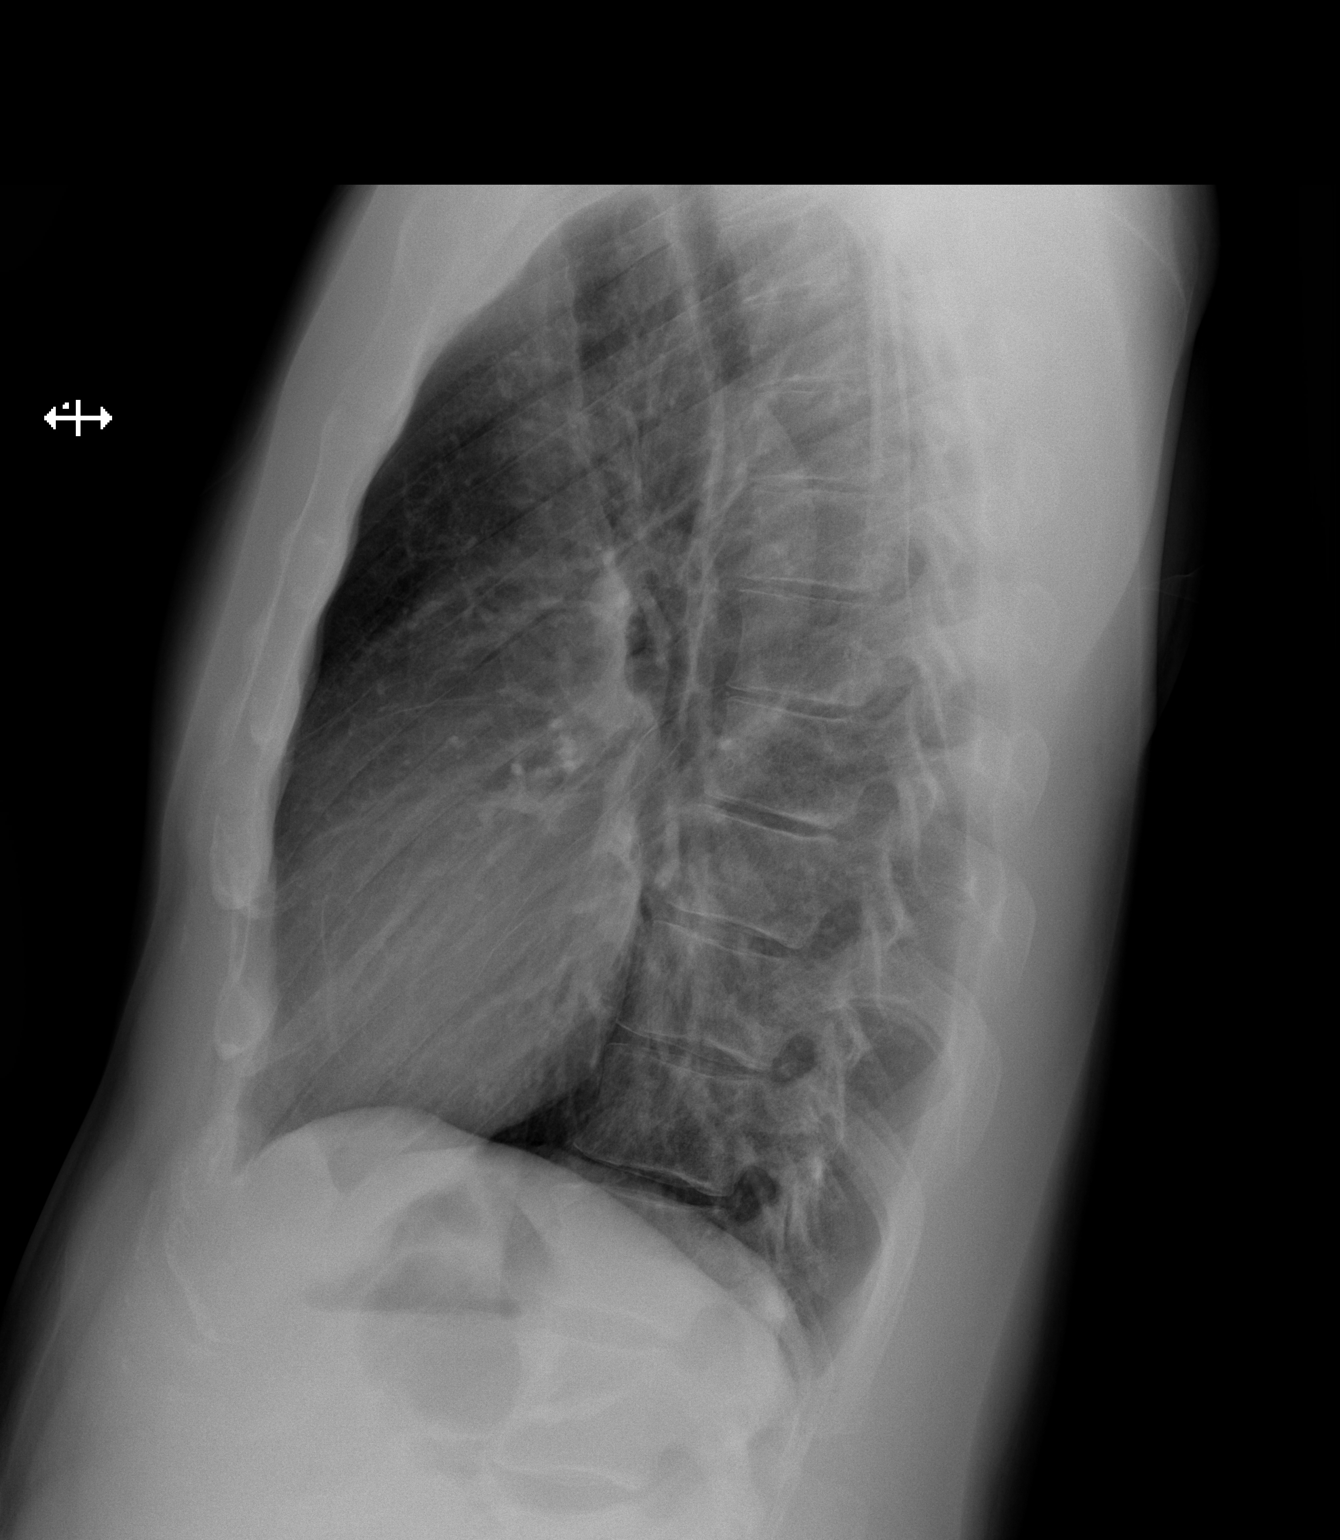

[2 of 2 positions shown; findings below may reference images not displayed]

FINDINGS: The lungs are adequately inflated. There is no focal infiltrate.
There is no pleural effusion. The heart and pulmonary vascularity
are normal. The mediastinum is normal in width. The bony thorax
exhibits no acute abnormality.
IMPRESSION: There is no pneumonia nor other acute cardiopulmonary abnormality.

## 2016-10-06 NOTE — Discharge Instructions (Signed)
You may take over the counter medicine for symptomatic relief, such as Tylenol, Motrin, TheraFlu, Alka seltzer ,a black elderberry, etc. Please limit acetaminophen (Tylenol) to 4000 mg and Ibuprofen (Motrin, Advil, etc.) to 2400 mg for a 24hr period. Please note that other counter medicine may contain acetaminophen or ibuprofen as a component of the ingredients.  ° ° ° °

## 2016-10-06 NOTE — ED Triage Notes (Signed)
Patient complains of 3 weeks of dry cough that is worse at night. No nasal congestion, NAD

## 2016-10-06 NOTE — ED Notes (Signed)
Pt voices understanding of discharge instructions. Ambulatory at discharge.  

## 2016-10-06 NOTE — ED Provider Notes (Signed)
[facility]-EMERGENCY DEPT Provider Note   CSN: [id number] Arrival date & time: 10/06/16  1210   By signing my name below, I, Pedro Gomez, attest that this documentation has been prepared under the direction and in the presence of Nira Conn, MD  Electronically Signed: Clovis Gomez, ED Scribe. 10/06/16. 1:43 PM.   History   Chief Complaint Chief Complaint  Patient presents with  . Cough    HPI Comments:  Pedro Gomez is a 36 y.o. male who presents to the Emergency Department complaining of persistent, intermittent, productive cough with yellow sputum x 2-3 weeks. His cough is worse when laying flat. He also reports nasal congestion, postnasal drip and rhinorrhea. He notes dust exposure at his workplace. Pt states he has experienced a similar cough in the past but notes it resolved in 2-3 days. He has taken Delsym with no relief. Pt denies fevers, hemoptysis, nausea, vomiting, diarrhea, chest pain, SOB, abdominal pain or any other associated symptoms. He also denies a hx of seasonal allergies. Pt notes a hx of cocaine use and states he last used 10 years ago. No other complaints noted.   The history is provided by the patient. No language interpreter was used.  Cough  This is a new problem. The current episode started more than 1 week ago. The problem occurs every few minutes. The problem has not changed since onset.The cough is productive of sputum. There has been no fever. Associated symptoms include rhinorrhea. Pertinent negatives include no chest pain. He has tried cough syrup for the symptoms. The treatment provided no relief.    History reviewed. No pertinent past medical history.  There are no active problems to display for this patient.   History reviewed. No pertinent surgical history.   Home Medications    Prior to Admission medications   Not on File    Family History No family history on file.  Social History Social History  Substance Use Topics  .  Smoking status: Current Every Day Smoker  . Smokeless tobacco: Never Used  . Alcohol use Not on file     Allergies   Patient has no known allergies.   Review of Systems Review of Systems  HENT: Positive for rhinorrhea.   Respiratory: Positive for cough.   Cardiovascular: Negative for chest pain.   All other systems reviewed and are negative for acute change except as noted in the HPI.   Physical Exam Updated Vital Signs BP (!) 148/79   Pulse 100   Temp 98.2 F (36.8 C) (Oral)   Resp 18   SpO2 98%   Physical Exam  Constitutional: He is oriented to person, place, and time. He appears well-developed and well-nourished. No distress.  HENT:  Head: Normocephalic and atraumatic.  Nose: Mucosal edema and septal deviation present.  No nasal ulcers. Postnasal drip noted.   Eyes: Conjunctivae and EOM are normal. Pupils are equal, round, and reactive to light. Right eye exhibits no discharge. Left eye exhibits no discharge. No scleral icterus.  Neck: Normal range of motion. Neck supple.  Cardiovascular: Normal rate and regular rhythm.  Exam reveals no gallop and no friction rub.   No murmur heard. Pulmonary/Chest: Effort normal and breath sounds normal. No stridor. No respiratory distress. He has no rales.  Abdominal: Soft. He exhibits no distension. There is no tenderness.  Musculoskeletal: He exhibits no edema or tenderness.  Neurological: He is alert and oriented to person, place, and time.  Skin: Skin is warm and dry. No rash  noted. He is not diaphoretic. No erythema.  Psychiatric: He has a normal mood and affect.  Vitals reviewed.    ED Treatments / Results  DIAGNOSTIC STUDIES:  Oxygen Saturation is 98% on RA, normal by my interpretation.    COORDINATION OF CARE:  1:32 PM Discussed treatment plan with pt at bedside and pt agreed to plan.  Labs (all labs ordered are listed, but only abnormal results are displayed) Labs Reviewed - No data to display  EKG  EKG  Interpretation None       Radiology Dg Chest 2 View  Result Date: 10/06/2016 CLINICAL DATA:  Nonproductive cough for 3 weeks, current smoker. EXAM: CHEST  2 VIEW COMPARISON:  PA and lateral chest x-ray of January 05, 2008 FINDINGS: The lungs are adequately inflated. There is no focal infiltrate. There is no pleural effusion. The heart and pulmonary vascularity are normal. The mediastinum is normal in width. The bony thorax exhibits no acute abnormality. IMPRESSION: There is no pneumonia nor other acute cardiopulmonary abnormality. Electronically Signed   By: David  [place] M.D.   On: 10/06/2016 13:22    Procedures Procedures (including critical care time)  Medications Ordered in ED Medications - No data to display   Initial Impression / Assessment and Plan / ED Course  I have reviewed the triage vital signs and the nursing notes.  Pertinent labs & imaging results that were available during my care of the patient were reviewed by me and considered in my medical decision making (see chart for details).     36 y.o. male presents with cough, rhinorrhea, nasal congestion for 2 weeks. appropriate oral hydration. Rest of history as above.  Patient appears well. No signs of toxicity, patient is interactive and playful. No hypoxia, tachypnea or other signs of respiratory distress. No sign of clinical dehydration. Lung exam clear. Rest of exam as above.  CXR negative.  Most consistent with viral upper respiratory infection vs allergic rhinitis vs environmental related rhinitis.   No evidence suggestive of pharyngitis, AOM, PNA, or TB.  Discussed symptomatic treatment with the patient and they will follow closely with their PCP.      Final Clinical Impressions(s) / ED Diagnoses   Final diagnoses:  Acute nasopharyngitis   Disposition: Discharge  Condition: Good  I have discussed the results, Dx and Tx plan with the patient who expressed understanding and agree(s) with the plan.  Discharge instructions discussed at great length. The patient was given strict return precautions who verbalized understanding of the instructions. No further questions at time of discharge.    New Prescriptions   No medications on file    Follow Up: Primary care provider      I personally performed the services described in this documentation, which was scribed in my presence. The recorded information has been reviewed and is accurate.        Nira Conn, MD 10/06/16 1351

## 2017-09-28 ENCOUNTER — Encounter (HOSPITAL_COMMUNITY): Payer: Self-pay

## 2017-09-28 ENCOUNTER — Emergency Department (HOSPITAL_COMMUNITY): Payer: Self-pay

## 2017-09-28 ENCOUNTER — Emergency Department (HOSPITAL_COMMUNITY)
Admission: EM | Admit: 2017-09-28 | Discharge: 2017-09-28 | Disposition: A | Discharge status: Home / Self Care | Payer: Self-pay | Attending: Emergency Medicine | Admitting: Emergency Medicine

## 2017-09-28 DIAGNOSIS — Z79899 Other long term (current) drug therapy: Secondary | ICD-10-CM | POA: Insufficient documentation

## 2017-09-28 DIAGNOSIS — F17200 Nicotine dependence, unspecified, uncomplicated: Secondary | ICD-10-CM | POA: Insufficient documentation

## 2017-09-28 DIAGNOSIS — I4891 Unspecified atrial fibrillation: Secondary | ICD-10-CM | POA: Insufficient documentation

## 2017-09-28 LAB — CBC
HCT: 40.2 % (ref 39.0–52.0)
Hemoglobin: 13.3 g/dL (ref 13.0–17.0)
MCH: 30 pg (ref 26.0–34.0)
MCHC: 33.1 g/dL (ref 30.0–36.0)
MCV: 90.5 fL (ref 78.0–100.0)
PLATELETS: 264 10*3/uL (ref 150–400)
RBC: 4.44 MIL/uL (ref 4.22–5.81)
RDW: 12.5 % (ref 11.5–15.5)
WBC: 3.6 10*3/uL — AB (ref 4.0–10.5)

## 2017-09-28 LAB — BASIC METABOLIC PANEL
Anion gap: 8 (ref 5–15)
BUN: 19 mg/dL (ref 6–20)
CO2: 25 mmol/L (ref 22–32)
CREATININE: 1.12 mg/dL (ref 0.61–1.24)
Calcium: 8.8 mg/dL — ABNORMAL LOW (ref 8.9–10.3)
Chloride: 105 mmol/L (ref 101–111)
GFR calc Af Amer: >60 mL/min (ref >60)
Glucose, Bld: 80 mg/dL (ref 65–99)
Potassium: 4 mmol/L (ref 3.5–5.1)
SODIUM: 138 mmol/L (ref 135–145)

## 2017-09-28 LAB — I-STAT TROPONIN, ED
TROPONIN I, POC: 0.01 ng/mL (ref 0.00–0.08)
Troponin i, poc: 0 ng/mL (ref 0.00–0.08)

## 2017-09-28 IMAGING — CR DG CHEST 2V
2 series · 2 of 2 positions shown · non-contrast
Comparison: [DATE]

CLINICAL DATA: Central chest pain since last night, dizziness,
shortness of breath, was smoking marijuana at time of symptom onset

EXAM:
CHEST - 2 VIEW

[chest pa]
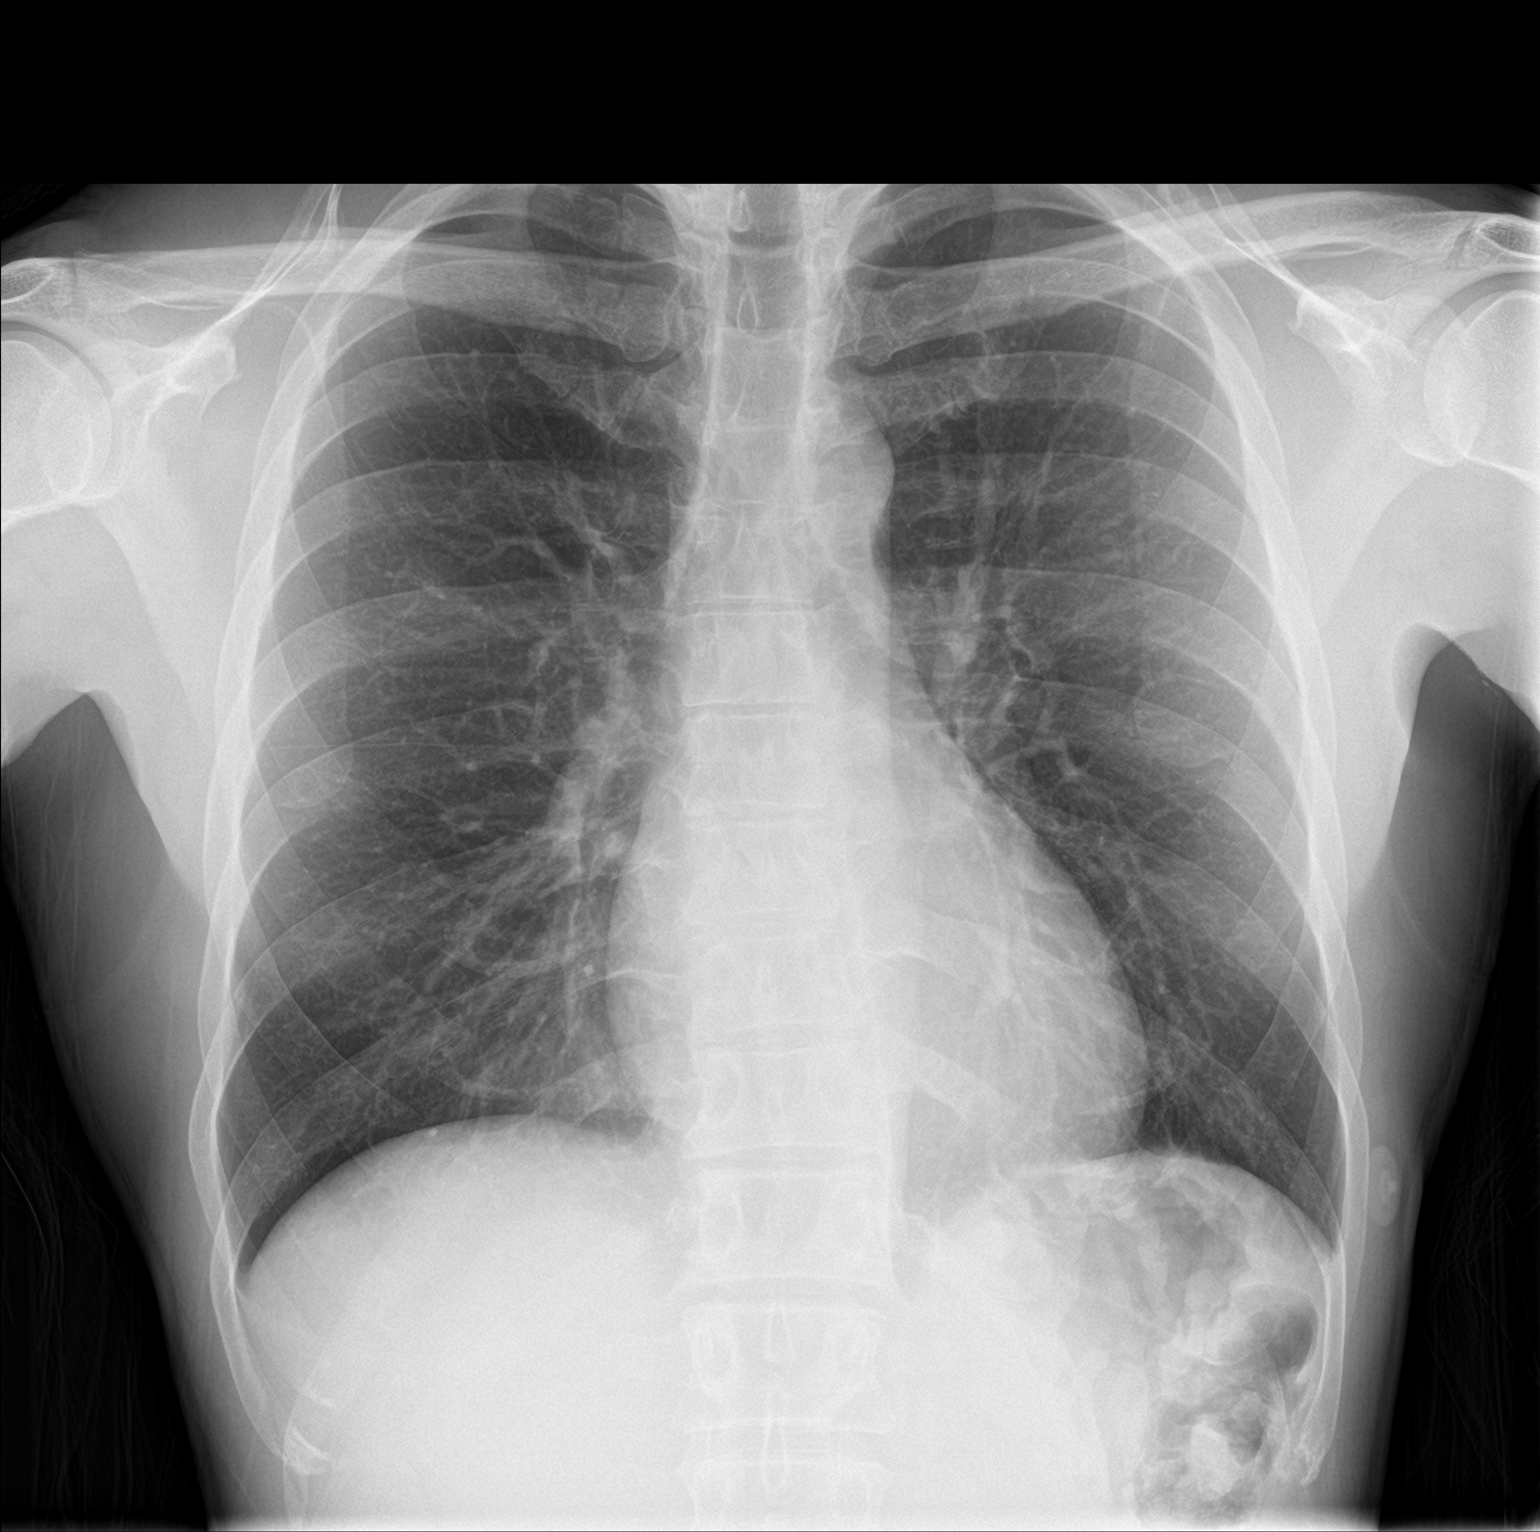

[chest lat]
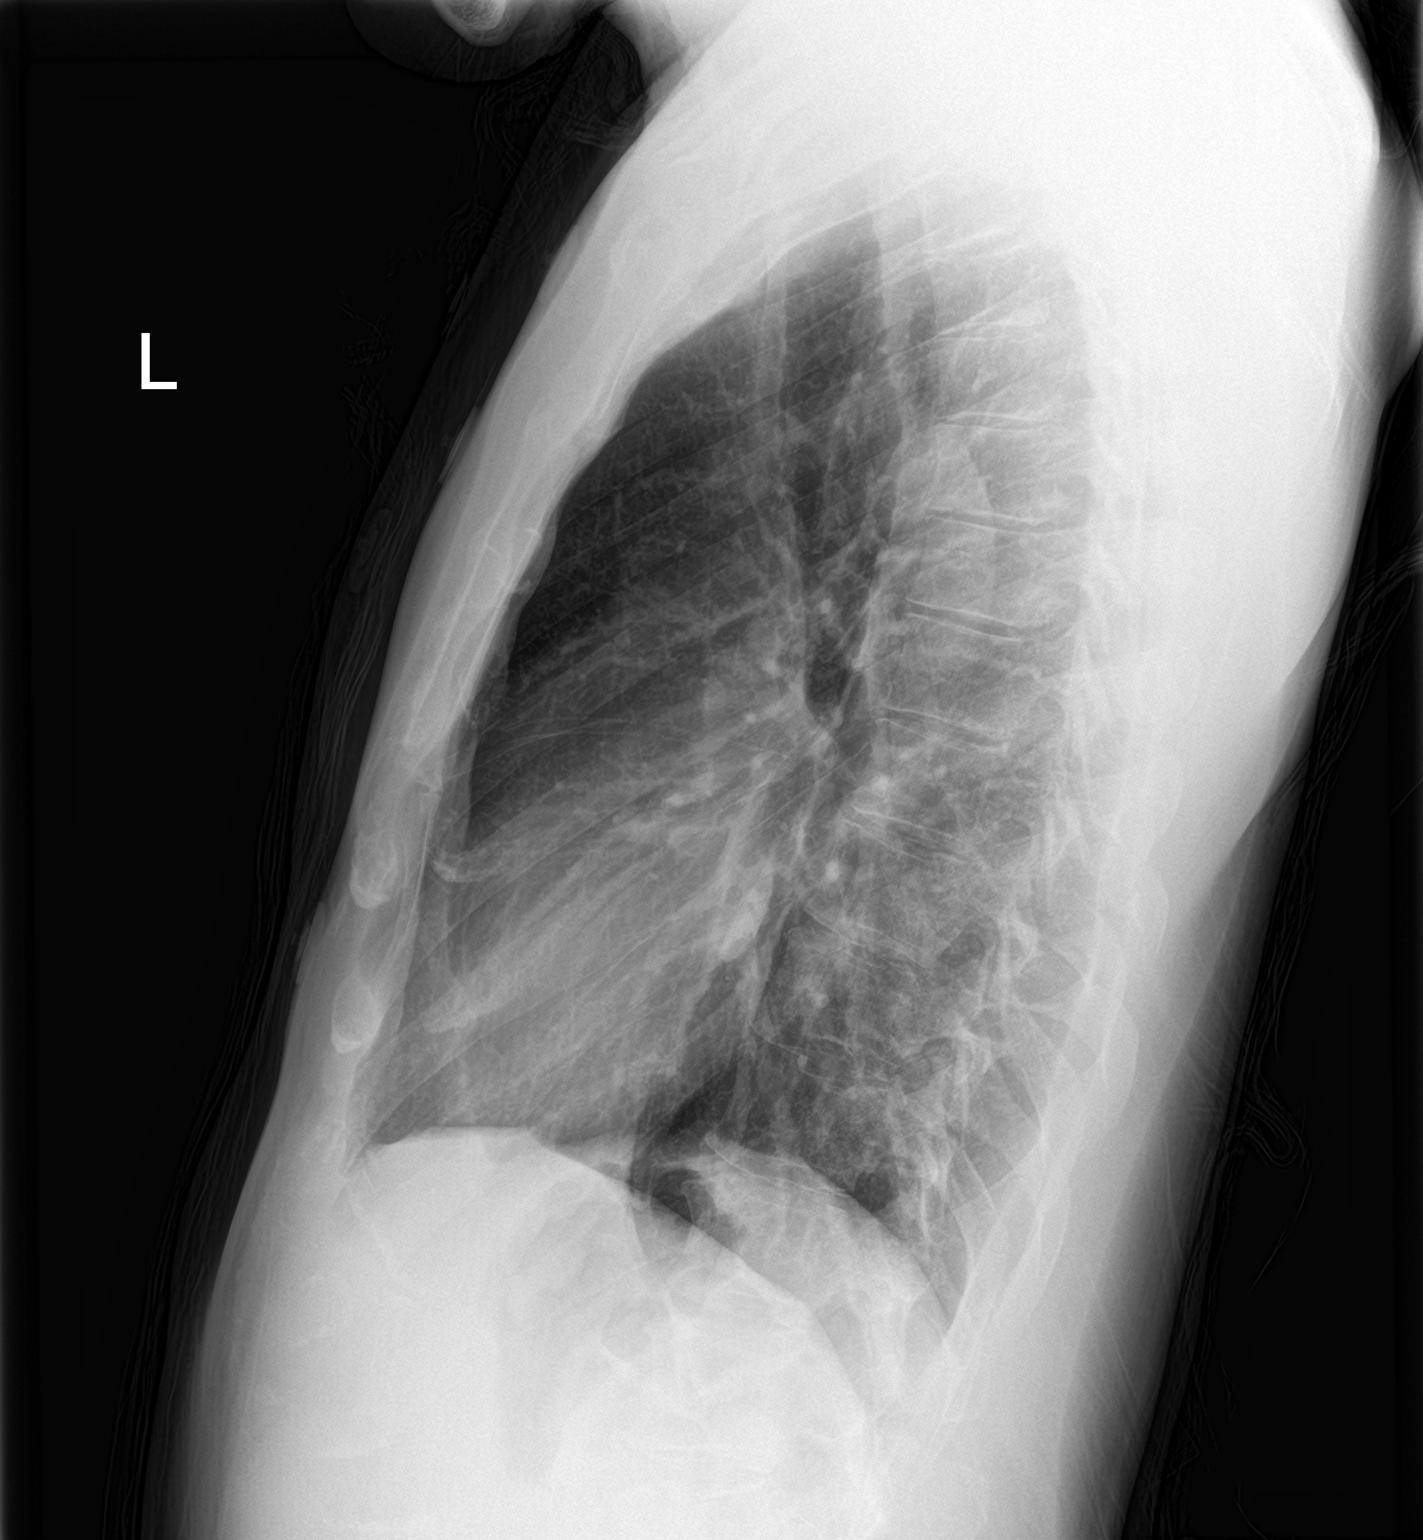

[2 of 2 positions shown; findings below may reference images not displayed]

FINDINGS: Normal heart size, mediastinal contours, and pulmonary vascularity.

Lungs clear.

No pleural effusion or pneumothorax.

Bones unremarkable.
IMPRESSION: No acute abnormalities.

## 2017-09-28 MED ORDER — ETOMIDATE 2 MG/ML IV SOLN
10.0000 mg | Freq: Once | INTRAVENOUS | Status: AC
  Administered 2017-09-28: 10 mg via INTRAVENOUS
  Filled 2017-09-28: qty 10

## 2017-09-28 MED ORDER — ONDANSETRON HCL 4 MG/2ML IJ SOLN
4.0000 mg | Freq: Once | INTRAMUSCULAR | Status: AC
  Administered 2017-09-28: 4 mg via INTRAVENOUS
  Filled 2017-09-28: qty 2

## 2017-09-28 MED ORDER — RIVAROXABAN 20 MG PO TABS: 20.0000 mg | ORAL_TABLET | Freq: Every day | ORAL | 0 refills | Status: DC

## 2017-09-28 NOTE — ED Provider Notes (Signed)
[facility] EMERGENCY DEPARTMENT Provider Note   CSN: [id number] Arrival date & time: 09/28/17  1221     History   Chief Complaint Chief Complaint  Patient presents with  . Chest Pain    HPI Pedro Gomez is a 37 y.o. male.  37 yo M with a cc of chest pressure and sob starting yesterday.  Started last night about midnight after smoking marijuanna.  Felt sob, chest pressure, heart racing.  Was still having some sob on exertion while at work today.  Also felt that his pulse was irregular.      The history is provided by the patient.  Chest Pain   This is a new problem. Associated symptoms include palpitations and shortness of breath. Pertinent negatives include no abdominal pain, no fever, no headaches and no vomiting.    History reviewed. No pertinent past medical history.  There are no active problems to display for this patient.   History reviewed. No pertinent surgical history.      Home Medications    Prior to Admission medications   Medication Sig Start Date End Date Taking? Authorizing Provider  ibuprofen (ADVIL,MOTRIN) 200 MG tablet Take 200 mg by mouth every 6 (six) hours as needed.   Yes [provider]  omeprazole (PRILOSEC) 20 MG capsule Take 20 mg by mouth daily.   Yes [provider]  rivaroxaban (XARELTO) 20 MG TABS tablet Take 1 tablet (20 mg total) by mouth daily with supper. 09/28/17   Melene Plan, DO    Family History No family history on file.  Social History Social History   Tobacco Use  . Smoking status: Current Every Day Smoker  . Smokeless tobacco: Never Used  Substance Use Topics  . Alcohol use: Not on file  . Drug use: Not on file     Allergies   Patient has no known allergies.   Review of Systems Review of Systems  Constitutional: Negative for chills and fever.  HENT: Negative for congestion and facial swelling.   Eyes: Negative for discharge and visual disturbance.  Respiratory:  Positive for shortness of breath.   Cardiovascular: Positive for chest pain and palpitations.  Gastrointestinal: Negative for abdominal pain, diarrhea and vomiting.  Musculoskeletal: Negative for arthralgias and myalgias.  Skin: Negative for color change and rash.  Neurological: Negative for tremors, syncope and headaches.  Psychiatric/Behavioral: Negative for confusion and dysphoric mood.     Physical Exam Updated Vital Signs BP 122/85   Pulse 61   Temp 97.9 F (36.6 C) (Oral)   Resp 16   SpO2 98%   Physical Exam  Constitutional: He is oriented to person, place, and time. He appears well-developed and well-nourished.  HENT:  Head: Normocephalic and atraumatic.  Eyes: Pupils are equal, round, and reactive to light. EOM are normal.  Neck: Normal range of motion. Neck supple. No JVD present.  Cardiovascular: Normal rate and regular rhythm. Exam reveals no gallop and no friction rub.  No murmur heard. Pulmonary/Chest: No respiratory distress. He has no wheezes.  Abdominal: He exhibits no distension. There is no rebound and no guarding.  Musculoskeletal: Normal range of motion.  Neurological: He is alert and oriented to person, place, and time.  Skin: No rash noted. No pallor.  Psychiatric: He has a normal mood and affect. His behavior is normal.  Nursing note and vitals reviewed.    ED Treatments / Results  Labs (all labs ordered are listed, but only abnormal results are displayed) Labs  Reviewed  BASIC METABOLIC PANEL - Abnormal; Notable for the following components:      Result Value   Calcium 8.8 (*)    All other components within normal limits  CBC - Abnormal; Notable for the following components:   WBC 3.6 (*)    All other components within normal limits  I-STAT TROPONIN, ED  I-STAT TROPONIN, ED    EKG EKG Interpretation  Date/Time:  Tuesday September 28 2017 12:27:01 EDT Ventricular Rate:  100 PR Interval:    QRS Duration: 90 QT Interval:  322 QTC  Calculation: 415 R Axis:   71 Text Interpretation:  Atrial fibrillation Moderate voltage criteria for LVH, may be normal variant Early repolarization Abnormal ECG No old tracing to compare Confirmed by Melene Plan [phone]) on 09/28/2017 5:07:10 PM   Radiology Dg Chest 2 View  Result Date: 09/28/2017 CLINICAL DATA:  Central chest pain since last night, dizziness, shortness of breath, was smoking marijuana at time of symptom onset EXAM: CHEST - 2 VIEW COMPARISON:  10/06/2016 FINDINGS: Normal heart size, mediastinal contours, and pulmonary vascularity. Lungs clear. No pleural effusion or pneumothorax. Bones unremarkable. IMPRESSION: No acute abnormalities. Electronically Signed   By: Ulyses Southward M.D.   On: 09/28/2017 12:40    Procedures .Sedation Date/Time: 09/28/2017 9:36 PM Performed by: Melene Plan, DO Authorized by: Melene Plan, DO   Consent:    Consent obtained:  Written   Consent given by:  Patient   Risks discussed:  Allergic reaction, dysrhythmia, inadequate sedation, nausea, vomiting, respiratory compromise necessitating ventilatory assistance and intubation, prolonged sedation necessitating reversal and prolonged hypoxia resulting in organ damage   Alternatives discussed:  Analgesia without sedation Universal protocol:    Immediately prior to procedure a time out was called: yes   Indications:    Procedure performed:  Cardioversion   Procedure necessitating sedation performed by:  Physician performing sedation   Intended level of sedation:  Moderate (conscious sedation) Pre-sedation assessment:    Time since last food or drink:  6   NPO status caution: urgency dictates proceeding with non-ideal NPO status     ASA classification: class 1 - normal, healthy patient     Neck mobility: normal     Mouth opening:  2 finger widths   Thyromental distance:  3 finger widths   Mallampati score:  I - soft palate, uvula, fauces, pillars visible   Pre-sedation assessments completed and reviewed:  airway patency, cardiovascular function, hydration status, mental status, nausea/vomiting, pain level and temperature   Immediate pre-procedure details:    Reassessment: Patient reassessed immediately prior to procedure     Reviewed: vital signs, relevant labs/tests and NPO status     Verified: bag valve mask available, emergency equipment available, intubation equipment available, IV patency confirmed, oxygen available, reversal medications available and suction available   Procedure details (see MAR for exact dosages):    Preoxygenation:  Nasal cannula   Sedation:  Etomidate   Intra-procedure monitoring:  Blood pressure monitoring, cardiac monitor, continuous capnometry, continuous pulse oximetry, frequent LOC assessments and frequent vital sign checks   Intra-procedure events: none     Total Provider sedation time (minutes):  25 Post-procedure details:    Attendance: Constant attendance by certified staff until patient recovered     Recovery: Patient returned to pre-procedure baseline     Post-sedation assessments completed and reviewed: airway patency, cardiovascular function, hydration status, mental status, nausea/vomiting, pain level, respiratory function and temperature     Patient is stable for discharge or admission:  yes     Patient tolerance:  Tolerated well, no immediate complications .Cardioversion Date/Time: 09/28/2017 9:38 PM Performed by: Melene PlanFloyd, Kymir Coles, DO Authorized by: Melene PlanFloyd, Keatyn Jawad, DO   Consent:    Consent obtained:  Verbal   Consent given by:  Patient   Risks discussed:  Cutaneous burn, death, induced arrhythmia and pain   Alternatives discussed:  No treatment, rate-control medication and anti-coagulation medication Pre-procedure details:    Cardioversion basis:  Emergent   Rhythm:  Atrial fibrillation   Electrode placement:  Anterior-posterior Patient sedated: Yes. Refer to sedation procedure documentation for details of sedation.  Attempt one:    Cardioversion mode:   Synchronous   Waveform:  Biphasic   Shock (Joules):  200   Shock outcome:  Conversion to normal sinus rhythm Post-procedure details:    Patient status:  Alert   Patient tolerance of procedure:  Tolerated well, no immediate complications   (including critical care time)  Medications Ordered in ED Medications  etomidate (AMIDATE) injection 10 mg (10 mg Intravenous Given 09/28/17 2016)  ondansetron (ZOFRAN) injection 4 mg (4 mg Intravenous Given 09/28/17 2006)     Initial Impression / Assessment and Plan / ED Course  I have reviewed the triage vital signs and the nursing notes.  Pertinent labs & imaging results that were available during my care of the patient were reviewed by me and considered in my medical decision making (see chart for details).     37 yo M with a cc of palpitations, chest pain, sob after using illegal drugs.  I discussed the case with Dr. Royann Shiversroitoru, he recommended the patient be cardioverted.  He is cardioverted at bedside.  Back to normal sinus rhythm.  Started on Xarelto discharge home to follow-up with A. fib clinic.  CHA2DS2/VAS Stroke Risk Points  Current as of 5 minutes ago     0 >= 2 Points: High Risk  1 - 1.99 Points: Medium Risk  0 Points: Low Risk    The patient's score has not changed in the past year.:  No Change     Details    This score determines the patient's risk of having a stroke if the  patient has atrial fibrillation.       Points Metrics  0 Has Congestive Heart Failure:  No    Current as of 5 minutes ago  0 Has Vascular Disease:  No    Current as of 5 minutes ago  0 Has Hypertension:  No    Current as of 5 minutes ago  0 Age:  [age]    Current as of 5 minutes ago  0 Has Diabetes:  No    Current as of 5 minutes ago  0 Had Stroke:  No  Had TIA:  No  Had thromboembolism:  No    Current as of 5 minutes ago  0 Male:  No    Current as of 5 minutes ago          9:39 PM:  I have discussed the diagnosis/risks/treatment options with the  patient and family and believe the pt to be eligible for discharge home to follow-up with Cards. We also discussed returning to the ED immediately if new or worsening sx occur. We discussed the sx which are most concerning (e.g., sudden worsening pain, fever, inability to tolerate by mouth) that necessitate immediate return. Medications administered to the patient during their visit and any new prescriptions provided to the patient are listed below.  Medications given during this  visit Medications  etomidate (AMIDATE) injection 10 mg (10 mg Intravenous Given 09/28/17 2016)  ondansetron (ZOFRAN) injection 4 mg (4 mg Intravenous Given 09/28/17 2006)     The patient appears reasonably screen and/or stabilized for discharge and I doubt any other medical condition or other [facility] requiring further screening, evaluation, or treatment in the ED at this time prior to discharge.    Final Clinical Impressions(s) / ED Diagnoses   Final diagnoses:  Atrial fibrillation, new onset [facility])    ED Discharge Orders        Ordered    rivaroxaban (XARELTO) 20 MG TABS tablet  Daily with supper     09/28/17 2128       Melene Plan, DO 09/28/17 2139

## 2017-09-28 NOTE — ED Notes (Signed)
Pt asking about leaving. Needs to pick up a family member. Explained to pt if he left and we call him, he will need to re-register and start over process. Pt has not left at this time.

## 2017-09-28 NOTE — ED Triage Notes (Signed)
Pt presents for evaluation of central cp starting last night. Pt reports he was smoking marijuana at the time, no hx of previous reaction. Pt reports dizziness and sob. Pain 5/10 on arrival to ED but has improved since last night.

## 2017-09-28 NOTE — ED Notes (Signed)
Pt stable and ambulatory for discharge, states understanding follow up.  

## 2017-09-28 NOTE — Sedation Documentation (Signed)
Shock delievered

## 2017-09-29 ENCOUNTER — Telehealth (HOSPITAL_COMMUNITY): Payer: Self-pay | Admitting: *Deleted

## 2017-09-29 NOTE — Telephone Encounter (Signed)
LMOM for pt to clbk re referral from ED for afib follow up

## 2017-10-04 ENCOUNTER — Encounter (HOSPITAL_COMMUNITY): Payer: Self-pay | Admitting: Nurse Practitioner

## 2017-10-04 ENCOUNTER — Ambulatory Visit (HOSPITAL_COMMUNITY)
Admission: RE | Admit: 2017-10-04 | Discharge: 2017-10-04 | Disposition: A | Discharge status: Home / Self Care | Payer: Self-pay | Source: Ambulatory Visit | Attending: Nurse Practitioner | Admitting: Nurse Practitioner

## 2017-10-04 VITALS — BP 126/74 | HR 87 | Ht 73.0 in | Wt 176.0 lb

## 2017-10-04 DIAGNOSIS — Z7901 Long term (current) use of anticoagulants: Secondary | ICD-10-CM | POA: Insufficient documentation

## 2017-10-04 DIAGNOSIS — F17210 Nicotine dependence, cigarettes, uncomplicated: Secondary | ICD-10-CM | POA: Insufficient documentation

## 2017-10-04 DIAGNOSIS — I480 Paroxysmal atrial fibrillation: Secondary | ICD-10-CM | POA: Insufficient documentation

## 2017-10-04 NOTE — Progress Notes (Addendum)
Primary Care Physician: Patient, No Pcp Per Referring Physician: [facility] ER f/u   Pedro Gomez is a 37 y.o. male with no significant past medical history that was in the [facility] for new onset afib with RVR, associated with shortness of breath and had successful cardioversion. It was noted that he had symptoms starting after midnight the night before after smoking marijuana.  In the afib clinic, he is in SR. He does smoke cigarette's, less than 1/2 pack a day. No alcohol, no stimulants, other than he did take some OTC cold meds the same week for cold symptoms. No snoring history.   Today, he denies symptoms of palpitations, chest pain, shortness of breath, orthopnea, PND, lower extremity edema, dizziness, presyncope, syncope, or neurologic sequela. The patient is tolerating medications without difficulties and is otherwise without complaint today.   No past medical history on file. No past surgical history on file.  Current Outpatient Medications  Medication Sig Dispense Refill  . rivaroxaban (XARELTO) 20 MG TABS tablet Take 1 tablet (20 mg total) by mouth daily with supper. 30 tablet 0   No current facility-administered medications for this encounter.     No Known Allergies  Social History   Socioeconomic History  . Marital status: Single    Spouse name: Not on file  . Number of children: Not on file  . Years of education: Not on file  . Highest education level: Not on file  Occupational History  . Not on file  Social Needs  . Financial resource strain: Not on file  . Food insecurity:    Worry: Not on file    Inability: Not on file  . Transportation needs:    Medical: Not on file    Non-medical: Not on file  Tobacco Use  . Smoking status: Current Every Day Smoker  . Smokeless tobacco: Never Used  Substance and Sexual Activity  . Alcohol use: Not on file  . Drug use: Not on file  . Sexual activity: Not on file  Lifestyle  . Physical activity:    Days per week: Not on  file    Minutes per session: Not on file  . Stress: Not on file  Relationships  . Social connections:    Talks on phone: Not on file    Gets together: Not on file    Attends religious service: Not on file    Active member of club or organization: Not on file    Attends meetings of clubs or organizations: Not on file    Relationship status: Not on file  . Intimate partner violence:    Fear of current or ex partner: Not on file    Emotionally abused: Not on file    Physically abused: Not on file    Forced sexual activity: Not on file  Other Topics Concern  . Not on file  Social History Narrative  . Not on file    No family history on file.  ROS- All systems are reviewed and negative except as per the HPI above  Physical Exam: Vitals:   10/04/17 1407  BP: 126/74  Pulse: 87  Weight: 176 lb (79.8 kg)  Height: 6\' 1"  (1.854 m)   Wt Readings from Last 3 Encounters:  10/04/17 176 lb (79.8 kg)    Labs: Lab Results  Component Value Date   NA 138 09/28/2017   K 4.0 09/28/2017   CL 105 09/28/2017   CO2 25 09/28/2017   GLUCOSE 80 09/28/2017  BUN 19 09/28/2017   CREATININE 1.12 09/28/2017   CALCIUM 8.8 (L) 09/28/2017   No results found for: INR No results found for: CHOL, HDL, LDLCALC, TRIG   GEN- The patient is well appearing, alert and oriented x 3 today.   Head- normocephalic, atraumatic Eyes-  Sclera clear, conjunctiva pink Ears- hearing intact Oropharynx- clear Neck- supple, no JVP Lymph- no cervical lymphadenopathy Lungs- Clear to ausculation bilaterally, normal work of breathing Heart- Regular rate and rhythm, no murmurs, rubs or gallops, PMI not laterally displaced GI- soft, NT, ND, + BS Extremities- no clubbing, cyanosis, or edema MS- no significant deformity or atrophy Skin- no rash or lesion Psych- euthymic mood, full affect Neuro- strength and sensation are intact  EKG- NSR at 87 bpm, Pr int 132 ms, qrs int 90 ms, qtc 401 ms Epic records  reviewed    Assessment and Plan:  1. New onset paroxysmal afib General education re afb Risk factors/lifestyle issues discussed Encouraged to minimize/stop tobacco/marijuana use Echo ordered  He will take xarelto 20 mg daily x 30 days and stop for chadsvasc score of 0 Bleeding precautions discussed Have asked the SW to contact pt as he has no insurance, for possibility of orange card  Will call results of echo when available  and needed f/u.  Pedro Gomez C. Matthew Folksarroll, ANP-C [facility] [place] [address] [place], [place] [address] [phone]

## 2017-10-05 ENCOUNTER — Ambulatory Visit (HOSPITAL_COMMUNITY)
Admission: RE | Admit: 2017-10-05 | Discharge: 2017-10-05 | Disposition: A | Discharge status: Home / Self Care | Payer: Self-pay | Source: Ambulatory Visit | Attending: Nurse Practitioner | Admitting: Nurse Practitioner

## 2017-10-05 DIAGNOSIS — I517 Cardiomegaly: Secondary | ICD-10-CM | POA: Insufficient documentation

## 2017-10-05 DIAGNOSIS — Z720 Tobacco use: Secondary | ICD-10-CM | POA: Insufficient documentation

## 2017-10-05 DIAGNOSIS — I480 Paroxysmal atrial fibrillation: Secondary | ICD-10-CM | POA: Insufficient documentation

## 2017-10-05 NOTE — Progress Notes (Signed)
  Echocardiogram 2D Echocardiogram has been performed.  Celene SkeenVijay  Letzy Gullickson 10/05/2017, 3:42 PM

## 2017-10-06 ENCOUNTER — Telehealth: Payer: Self-pay | Admitting: Licensed Clinical Social Worker

## 2017-10-06 NOTE — Telephone Encounter (Signed)
CSW received referral from Afib clinic to assist patient with insurance. CSW left message for return call. Lasandra BeechJackie Rosaria Kubin, LCSW, CCSW-MCS [phone]

## 2017-10-08 ENCOUNTER — Encounter (HOSPITAL_COMMUNITY): Payer: Self-pay | Admitting: *Deleted

## 2017-10-20 ENCOUNTER — Telehealth (HOSPITAL_COMMUNITY): Payer: Self-pay | Admitting: *Deleted

## 2017-10-20 NOTE — Telephone Encounter (Signed)
Pt informed of the echo results via donna carroll np - pt asymptomatic no further afib/shortness of breath etc. Following up with general cardiology for further workup if needed in June with Dr. Eldridge DaceVaranasi. Pt verbalized understanding.

## 2017-12-07 ENCOUNTER — Encounter

## 2017-12-07 ENCOUNTER — Encounter: Payer: Self-pay | Admitting: Interventional Cardiology

## 2017-12-07 ENCOUNTER — Ambulatory Visit (INDEPENDENT_AMBULATORY_CARE_PROVIDER_SITE_OTHER): Payer: Self-pay | Admitting: Interventional Cardiology

## 2017-12-07 VITALS — BP 122/60 | HR 61 | Ht 73.0 in | Wt 182.0 lb

## 2017-12-07 DIAGNOSIS — I480 Paroxysmal atrial fibrillation: Secondary | ICD-10-CM

## 2017-12-07 DIAGNOSIS — R931 Abnormal findings on diagnostic imaging of heart and coronary circulation: Secondary | ICD-10-CM

## 2017-12-07 DIAGNOSIS — Z720 Tobacco use: Secondary | ICD-10-CM

## 2017-12-07 NOTE — Patient Instructions (Signed)
Medication Instructions:  Your physician recommends that you continue on your current medications as directed. Please refer to the Current Medication list given to you today.   Labwork: TODAY: BMET, CBC  Testing/Procedures: Your physician has requested that you have a TEE. During a TEE, sound waves are used to create images of your heart. It provides your doctor with information about the size and shape of your heart and how well your heart's chambers and valves are working. In this test, a transducer is attached to the end of a flexible tube that's guided down your throat and into your esophagus (the tube leading from you mouth to your stomach) to get a more detailed image of your heart. You are not awake for the procedure. Please see the instruction sheet given to you today. For further information please visit [email or web address].   Follow-Up: Based on test results  Any Other Special Instructions Will Be Listed Below (If Applicable).  TEE Instructions:  You are scheduled for a TEE on 12/13/17 with Dr. Rennis GoldenHilty.  [facility]: [address] [place], [place] [address] at 7:00 AM. (1 hour prior to procedure)  DIET: Nothing to eat or drink after midnight except a sip of water with medications (see medication instructions below)  Medication Instructions: None  Labs: TODAY  You must have a responsible person to drive you home and stay in the waiting area during your procedure. Failure to do so could result in cancellation.  Bring your insurance cards.  *Special Note: Every effort is made to have your procedure done on time. Occasionally there are emergencies that occur at the hospital that may cause delays. Please be patient if a delay does occur.     If you need a refill on your cardiac medications before your next appointment, please call your pharmacy.

## 2017-12-07 NOTE — Progress Notes (Signed)
Cardiology Office Note   Date:  12/07/2017   ID:  Pedro Gomez, DOB May 11, 1981, MRN [id number]  PCP:  Patient, No Pcp Per    No chief complaint on file.  F/u abnormal echo   Wt Readings from Last 3 Encounters:  12/07/17 182 lb (82.6 kg)  10/04/17 176 lb (79.8 kg)       History of Present Illness: Pedro Gomez is a 37 y.o. male  With recent diagnosis of AFib.  He was seen in the AFib clinic.    Prior records show he "was in the [facility] for new onset afib with RVR, associated with shortness of breath and had successful cardioversion. It was noted that he had symptoms starting after midnight the night before after smoking marijuana."  Denies : Chest pain. Dizziness. Leg edema. Nitroglycerin use. Orthopnea. Palpitations. Paroxysmal nocturnal dyspnea. Shortness of breath. Syncope.   Echo showed dilated right side concerning for ASD.    History reviewed. No pertinent past medical history.  History reviewed. No pertinent surgical history.   No current outpatient medications on file.   No current facility-administered medications for this visit.     Allergies:   Patient has no known allergies.    Social History:  The patient  reports that he has been smoking.  He has never used smokeless tobacco.   Family History:  The patient's family history is not on file. He thinks his mother may have had some type of hole in her heart but not sure.   ROS:  Please see the history of present illness.   Otherwise, review of systems are positive for recent AFib.   All other systems are reviewed and negative.    PHYSICAL EXAM: VS:  BP 122/60   Pulse 61   Ht 6\' 1"  (1.854 m)   Wt 182 lb (82.6 kg)   SpO2 96%   BMI 24.01 kg/m  , BMI Body mass index is 24.01 kg/m. GEN: Well nourished, well developed, in no acute distress  HEENT: normal  Neck: no JVD, carotid bruits, or masses Cardiac: RRR; no murmurs, rubs, or gallops,no edema ; no fixed split S2 Respiratory:  clear to  auscultation bilaterally, normal work of breathing GI: soft, nontender, nondistended, + BS MS: no deformity or atrophy ; firm area on a vein in the right arm Skin: warm and dry, no rash Neuro:  Strength and sensation are intact Psych: euthymic mood, full affect     Recent Labs: 09/28/2017: BUN 19; Creatinine, Ser 1.12; Hemoglobin 13.3; Platelets 264; Potassium 4.0; Sodium 138   Lipid Panel No results found for: CHOL, TRIG, HDL, CHOLHDL, VLDL, LDLCALC, LDLDIRECT   Other studies Reviewed: Additional studies/ records that were reviewed today with results demonstrating: 2019 echo reviewed showing possible ASD.   ASSESSMENT AND PLAN:  1. Abnormal echocardiogram: Possible ASD based on RV enlargement, RA enlargemetn.  Plan for TEE.  Risks and benefits explained.  All questions answered.    2. Tobacco abuse: He needs to stop smoking.  He has not tried any cessation aids.   3. AFib: May need to consider longer term anticoagulation if he has a recurrence of AFib given structural heart disease. 4. Warm compresses to the area on his right arm.   Current medicines are reviewed at length with the patient today.  The patient concerns regarding his medicines were addressed.  The following changes have been made:  No change  Labs/ tests ordered today include: TEE No orders of the defined  types were placed in this encounter.   Recommend 150 minutes/week of aerobic exercise Low fat, low carb, high fiber diet recommended  Disposition:   FU based on TEE result; may need structural appt as AFib would be an indication for ASD closure. Doubt CAD.     Signed, Lance MussJayadeep Zamere Pasternak, MD  12/07/2017 2:41 PM    [facility] [address], [place], [place]  [address] Phone: [phone]; Fax: [phone]

## 2017-12-08 LAB — BASIC METABOLIC PANEL
BUN / CREAT RATIO: 18 (ref 9–20)
BUN: 19 mg/dL (ref 6–20)
CALCIUM: 9.3 mg/dL (ref 8.7–10.2)
CO2: 23 mmol/L (ref 20–29)
CREATININE: 1.08 mg/dL (ref 0.76–1.27)
Chloride: 105 mmol/L (ref 96–106)
GFR calc Af Amer: 101 mL/min/{1.73_m2} (ref >59)
GFR calc non Af Amer: 87 mL/min/{1.73_m2} (ref >59)
GLUCOSE: 91 mg/dL (ref 65–99)
Potassium: 4.2 mmol/L (ref 3.5–5.2)
Sodium: 142 mmol/L (ref 134–144)

## 2017-12-08 LAB — CBC
HEMOGLOBIN: 12.9 g/dL — AB (ref 13.0–17.7)
Hematocrit: 38.3 % (ref 37.5–51.0)
MCH: 30.4 pg (ref 26.6–33.0)
MCHC: 33.7 g/dL (ref 31.5–35.7)
MCV: 90 fL (ref 79–97)
Platelets: 287 10*3/uL (ref 150–450)
RBC: 4.24 x10E6/uL (ref 4.14–5.80)
RDW: 13.1 % (ref 12.3–15.4)
WBC: 4.3 10*3/uL (ref 3.4–10.8)

## 2017-12-10 ENCOUNTER — Other Ambulatory Visit: Payer: Self-pay | Admitting: Interventional Cardiology

## 2017-12-10 DIAGNOSIS — R931 Abnormal findings on diagnostic imaging of heart and coronary circulation: Secondary | ICD-10-CM

## 2017-12-13 ENCOUNTER — Other Ambulatory Visit: Payer: Self-pay

## 2017-12-13 ENCOUNTER — Ambulatory Visit (HOSPITAL_COMMUNITY): Payer: Self-pay | Admitting: Anesthesiology

## 2017-12-13 ENCOUNTER — Ambulatory Visit (HOSPITAL_COMMUNITY)
Admission: RE | Admit: 2017-12-13 | Discharge: 2017-12-13 | Disposition: A | Discharge status: Home / Self Care | Payer: Self-pay | Source: Ambulatory Visit | Attending: Internal Medicine | Admitting: Internal Medicine

## 2017-12-13 ENCOUNTER — Ambulatory Visit (HOSPITAL_BASED_OUTPATIENT_CLINIC_OR_DEPARTMENT_OTHER): Payer: Self-pay

## 2017-12-13 ENCOUNTER — Encounter (HOSPITAL_COMMUNITY): Payer: Self-pay | Admitting: *Deleted

## 2017-12-13 ENCOUNTER — Encounter (HOSPITAL_COMMUNITY)
Admission: RE | Disposition: A | Discharge status: Home / Self Care | Payer: Self-pay | Source: Ambulatory Visit | Attending: Internal Medicine

## 2017-12-13 DIAGNOSIS — F17210 Nicotine dependence, cigarettes, uncomplicated: Secondary | ICD-10-CM | POA: Insufficient documentation

## 2017-12-13 DIAGNOSIS — Q211 Atrial septal defect: Secondary | ICD-10-CM | POA: Insufficient documentation

## 2017-12-13 DIAGNOSIS — R931 Abnormal findings on diagnostic imaging of heart and coronary circulation: Secondary | ICD-10-CM

## 2017-12-13 HISTORY — PX: TEE WITHOUT CARDIOVERSION: SHX5443

## 2017-12-13 SURGERY — ECHOCARDIOGRAM, TRANSESOPHAGEAL
Anesthesia: Monitor Anesthesia Care

## 2017-12-13 MED ORDER — PROPOFOL 10 MG/ML IV BOLUS
INTRAVENOUS | Status: DC | PRN
  Administered 2017-12-13 (×3): 20 mg via INTRAVENOUS

## 2017-12-13 MED ORDER — SODIUM CHLORIDE 0.9 % IV SOLN
INTRAVENOUS | Status: DC
  Administered 2017-12-13: 07:00:00 via INTRAVENOUS

## 2017-12-13 MED ORDER — LIDOCAINE HCL (CARDIAC) PF 100 MG/5ML IV SOSY
PREFILLED_SYRINGE | INTRAVENOUS | Status: DC | PRN
  Administered 2017-12-13: 80 mg via INTRAVENOUS

## 2017-12-13 MED ORDER — PROPOFOL 500 MG/50ML IV EMUL
INTRAVENOUS | Status: DC | PRN
  Administered 2017-12-13: 125 ug/kg/min via INTRAVENOUS

## 2017-12-13 NOTE — H&P (Signed)
   INTERVAL PROCEDURE H&P  History and Physical Interval Note:  12/13/2017 7:46 AM  Cherylynn Ridgesalvin E Sharples has presented today for their planned procedure. The various methods of treatment have been discussed with the patient and family. After consideration of risks, benefits and other options for treatment, the patient has consented to the procedure.  The patients' outpatient history has been reviewed, patient examined, and no change in status from most recent office note within the past 30 days. I have reviewed the patients' chart and labs and will proceed as planned. Questions were answered to the patient's satisfaction.   Chrystie NoseKenneth C. Berk Pilot, MD, [facility] FacilityFACC, FACP  [place]  [facility]  Medical Director of the Advanced Lipid Disorders &  Cardiovascular Risk Reduction Clinic Diplomate of the American Board of Clinical Lipidology Attending Cardiologist  Direct Dial: [phone]  Fax: [phone]  Website:  [email or web address]  Sabryn Preslar C Lenox Ladouceur 12/13/2017, 7:46 AM

## 2017-12-13 NOTE — Anesthesia Preprocedure Evaluation (Addendum)
Anesthesia Evaluation  Patient identified by MRN, date of birth, ID band Patient awake    Reviewed: Allergy & Precautions, NPO status , Patient's Chart, lab work & pertinent test results  Airway Mallampati: II  TM Distance: >3 FB Neck ROM: Full    Dental  (+) Teeth Intact, Dental Advisory Given   Pulmonary Current Smoker,    breath sounds clear to auscultation       Cardiovascular  Rhythm:Regular Rate:Normal     Neuro/Psych    GI/Hepatic   Endo/Other    Renal/GU      Musculoskeletal   Abdominal   Peds  Hematology   Anesthesia Other Findings   Reproductive/Obstetrics                             Anesthesia Physical Anesthesia Plan  ASA: II  Anesthesia Plan: MAC   Post-op Pain Management:    Induction: Intravenous  PONV Risk Score and Plan: Propofol infusion  Airway Management Planned: Natural Airway and Nasal Cannula  Additional Equipment:   Intra-op Plan:   Post-operative Plan:   Informed Consent: I have reviewed the patients History and Physical, chart, labs and discussed the procedure including the risks, benefits and alternatives for the proposed anesthesia with the patient or authorized representative who has indicated his/her understanding and acceptance.   Dental advisory given  Plan Discussed with: CRNA and Anesthesiologist  Anesthesia Plan Comments:         Anesthesia Quick Evaluation  

## 2017-12-13 NOTE — Discharge Instructions (Signed)

## 2017-12-13 NOTE — Anesthesia Postprocedure Evaluation (Signed)
Anesthesia Post Note  Patient: Pedro Gomez  Procedure(s) Performed: TRANSESOPHAGEAL ECHOCARDIOGRAM (TEE) (N/A )     Patient location during evaluation: PACU Anesthesia Type: MAC Level of consciousness: awake and alert Pain management: pain level controlled Vital Signs Assessment: post-procedure vital signs reviewed and stable Respiratory status: spontaneous breathing, nonlabored ventilation, respiratory function stable and patient connected to nasal cannula oxygen Cardiovascular status: stable and blood pressure returned to baseline Postop Assessment: no apparent nausea or vomiting Anesthetic complications: no    Last Vitals:  Vitals:   12/13/17 0843 12/13/17 0855  BP: 114/69 115/72  Pulse: 73 (!) 59  Resp: 15 17  Temp: 36.6 C   SpO2: 98% 99%    Last Pain:  Vitals:   12/13/17 0855  TempSrc:   PainSc: 0-No pain                 [name]

## 2017-12-13 NOTE — CV Procedure (Signed)
TRANSESOPHAGEAL ECHOCARDIOGRAM (TEE) NOTE  INDICATIONS: possible ASD  PROCEDURE:   Informed consent was obtained prior to the procedure. The risks, benefits and alternatives for the procedure were discussed and the patient comprehended these risks.  Risks include, but are not limited to, cough, sore throat, vomiting, nausea, somnolence, esophageal and stomach trauma or perforation, bleeding, low blood pressure, aspiration, pneumonia, infection, trauma to the teeth and death.    After a procedural time-out, the patient was given propofol per anesthesia for sedation.  The patient's heart rate, blood pressure, and oxygen saturation are monitored continuously during the procedure.The oropharynx was anesthetized 2 cetacaine sprays.  The transesophageal probe was inserted in the esophagus and stomach without difficulty and multiple views were obtained.  The patient was kept under observation until the patient left the procedure room. The patient left the procedure room in stable condition.   Agitated microbubble saline contrast was administered.  COMPLICATIONS:    There were no immediate complications.  Findings:  1. LEFT VENTRICLE: The left ventricular wall thickness is normal.  The left ventricular cavity is normal in size. Wall motion is normal.  LVEF is 55-60%.  2. RIGHT VENTRICLE:  The right ventricle is normal in structure and function without any thrombus or masses.    3. LEFT ATRIUM:  The left atrium is normal in size without any thrombus or masses.  There is not spontaneous echo contrast ("smoke") in the left atrium consistent with a low flow state.  4. LEFT ATRIAL APPENDAGE:  The left atrial appendage is free of any thrombus or masses. The appendage has single lobes. Pulse doppler indicates high flow in the appendage.  5. ATRIAL SEPTUM:  The atrial septum was extensively visualized. There is a very small PFO (barely visualized by color doppler) and a small amount of saline  microbubbles were noted to cross the septum after 4 beats. There is no evidence for significant atrial septal defect.  6. RIGHT ATRIUM:  The right atrium is mildly dilated without thrombus or masses.  7. MITRAL VALVE:  The mitral valve is normal in structure and function with no regurgitation.  There were no vegetations or stenosis.  8. AORTIC VALVE:  The aortic valve is trileaflet, normal in structure and function with no regurgitation.  There were no vegetations or stenosis  9. TRICUSPID VALVE:  The tricuspid valve is normal in structure and function with trivial regurgitation.  There were no vegetations or stenosis  10.  PULMONIC VALVE:  The pulmonic valve is normal in structure and function with trivial regurgitation.  There were no vegetations or stenosis.   11. AORTIC ARCH, ASCENDING AND DESCENDING AORTA:  There was no Myrtis Ser et. Al, 1992) atherosclerosis of the ascending aorta, aortic arch, or proximal descending aorta.  12. PULMONARY VEINS: Anomalous pulmonary venous return was not noted.  13. PERICARDIUM: The pericardium appeared normal and non-thickened.  There is no pericardial effusion.  IMPRESSION:   1. No significant ASD 2. Small PFO with minimal bidirectional shunting 3. Mild RAE 4. No significant valvular disease 5. LVEF 55-60%  RECOMMENDATIONS:    1. No significant ASD noted. Very small PFO, but saline microbubbles did cross from right to left. Mild right atrial enlargement is noted. The patient's mother noted he had been having symptoms which may have been a-fib for the past 7 years, which could explain his atrial enlargement.  Time Spent Directly with the Patient:  60 minutes   Chrystie Nose, MD, [facility], FACP  [place]  [facility]  [facility]  Medical Director of the [facility] Lip[facility] &  Cardiovascular Risk Reduction Clinic Diplomate of the American Board of Clinical Lipidology Attending Cardiologist  Direct Dial: [phone]  Fax: [phone]    Website:  [email or web address]  Dheeraj Hail C Dakoda Bassette 12/13/2017, 8:47 AM

## 2017-12-13 NOTE — Progress Notes (Signed)
  Echocardiogram Echocardiogram Transesophageal has been performed.  Pedro SavoyCasey N Thaddus Mcdowell 12/13/2017, 9:01 AM

## 2017-12-13 NOTE — Transfer of Care (Signed)
Immediate Anesthesia Transfer of Care Note  Patient: Pedro Gomez  Procedure(s) Performed: TRANSESOPHAGEAL ECHOCARDIOGRAM (TEE) (N/A )  Patient Location: Endoscopy Unit  Anesthesia Type:MAC  Level of Consciousness: awake, patient cooperative and responds to stimulation  Airway & Oxygen Therapy: Patient Spontanous Breathing and Patient connected to nasal cannula oxygen  Post-op Assessment: Report given to RN, Post -op Vital signs reviewed and stable and Patient moving all extremities X 4  Post vital signs: Reviewed and stable  Last Vitals:  Vitals Value Taken Time  BP    Temp    Pulse 73 12/13/2017  8:43 AM  Resp 15 12/13/2017  8:43 AM  SpO2 98 % 12/13/2017  8:43 AM  Vitals shown include unvalidated device data.  Last Pain:  Vitals:   12/13/17 0650  TempSrc: Oral  PainSc: 0-No pain         Complications: No apparent anesthesia complications

## 2017-12-15 ENCOUNTER — Encounter (HOSPITAL_COMMUNITY): Payer: Self-pay | Admitting: Internal Medicine

## 2018-04-18 ENCOUNTER — Ambulatory Visit: Payer: Self-pay | Admitting: Interventional Cardiology

## 2018-04-18 NOTE — Progress Notes (Deleted)
    Cardiology Office Note   Date:  04/18/2018   ID:  Pedro Gomez, DOB 1980-08-10, MRN [id number]  PCP:  Patient, No Pcp Per    No chief complaint on file.    Wt Readings from Last 3 Encounters:  12/13/17 182 lb (82.6 kg)  12/07/17 182 lb (82.6 kg)  10/04/17 176 lb (79.8 kg)       History of Present Illness: Pedro Gomez is a 37 y.o. male  ***    No past medical history on file.  Past Surgical History:  Procedure Laterality Date  . TEE WITHOUT CARDIOVERSION N/A 12/13/2017   Procedure: TRANSESOPHAGEAL ECHOCARDIOGRAM (TEE);  Surgeon: Chrystie Nose, MD;  Location: [facility] ENDOSCOPY;  Service: Cardiovascular;  Laterality: N/A;     No current outpatient medications on file.   No current facility-administered medications for this visit.     Allergies:   Patient has no known allergies.    Social History:  The patient  reports that he has been smoking. He has never used smokeless tobacco.   Family History:  The patient's ***family history is not on file.    ROS:  Please see the history of present illness.   Otherwise, review of systems are positive for ***.   All other systems are reviewed and negative.    PHYSICAL EXAM: VS:  There were no vitals taken for this visit. , BMI There is no height or weight on file to calculate BMI. GEN: Well nourished, well developed, in no acute distress HEENT: normal Neck: no JVD, carotid bruits, or masses Cardiac: ***RRR; no murmurs, rubs, or gallops,no edema  Respiratory:  clear to auscultation bilaterally, normal work of breathing GI: soft, nontender, nondistended, + BS MS: no deformity or atrophy Skin: warm and dry, no rash Neuro:  Strength and sensation are intact Psych: euthymic mood, full affect   EKG:   The ekg ordered today demonstrates ***   Recent Labs: 12/07/2017: BUN 19; Creatinine, Ser 1.08; Hemoglobin 12.9; Platelets 287; Potassium 4.2; Sodium 142   Lipid Panel No results found for: CHOL, TRIG, HDL,  CHOLHDL, VLDL, LDLCALC, LDLDIRECT   Other studies Reviewed: Additional studies/ records that were reviewed today with results demonstrating: ***.   ASSESSMENT AND PLAN:  1. ***  2. *** 3. ***   Current medicines are reviewed at length with the patient today.  The patient concerns regarding his medicines were addressed.  The following changes have been made:  No change***  Labs/ tests ordered today include: *** No orders of the defined types were placed in this encounter.   Recommend 150 minutes/week of aerobic exercise Low fat, low carb, high fiber diet recommended  Disposition:   FU in ***   Signed, Lance Muss, MD  04/18/2018 12:58 PM    [facility] [address] [place], [place], [place]  [address] Phone: [phone]; Fax: [phone]

## 2018-04-19 ENCOUNTER — Encounter: Payer: Self-pay | Admitting: Interventional Cardiology

## 2018-06-27 ENCOUNTER — Other Ambulatory Visit: Payer: Self-pay

## 2018-06-27 ENCOUNTER — Emergency Department (HOSPITAL_COMMUNITY): Payer: No Typology Code available for payment source

## 2018-06-27 ENCOUNTER — Encounter (HOSPITAL_COMMUNITY): Payer: Self-pay

## 2018-06-27 ENCOUNTER — Emergency Department (HOSPITAL_COMMUNITY)
Admission: EM | Admit: 2018-06-27 | Discharge: 2018-06-27 | Disposition: A | Discharge status: Home / Self Care | Payer: No Typology Code available for payment source | Attending: Emergency Medicine | Admitting: Emergency Medicine

## 2018-06-27 DIAGNOSIS — Y999 Unspecified external cause status: Secondary | ICD-10-CM | POA: Insufficient documentation

## 2018-06-27 DIAGNOSIS — S161XXA Strain of muscle, fascia and tendon at neck level, initial encounter: Secondary | ICD-10-CM | POA: Diagnosis not present

## 2018-06-27 DIAGNOSIS — Y9389 Activity, other specified: Secondary | ICD-10-CM | POA: Diagnosis not present

## 2018-06-27 DIAGNOSIS — S199XXA Unspecified injury of neck, initial encounter: Secondary | ICD-10-CM | POA: Diagnosis present

## 2018-06-27 DIAGNOSIS — Y92410 Unspecified street and highway as the place of occurrence of the external cause: Secondary | ICD-10-CM | POA: Insufficient documentation
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
## 2018-06-27 MED ORDER — NAPROXEN 500 MG PO TABS
500.0000 mg | ORAL_TABLET | Freq: Once | ORAL | Status: AC
  Administered 2018-06-27: 500 mg via ORAL
  Filled 2018-06-27: qty 1

## 2018-06-27 MED ORDER — NAPROXEN 500 MG PO TABS: 500.0000 mg | ORAL_TABLET | Freq: Two times a day (BID) | ORAL | 0 refills | Status: DC

## 2018-06-27 MED ORDER — METHOCARBAMOL 500 MG PO TABS: 1000.0000 mg | ORAL_TABLET | Freq: Four times a day (QID) | ORAL | 0 refills | Status: DC
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
## 2018-12-21 ENCOUNTER — Other Ambulatory Visit: Payer: Self-pay

## 2018-12-21 ENCOUNTER — Emergency Department (HOSPITAL_COMMUNITY)
Admission: EM | Admit: 2018-12-21 | Discharge: 2018-12-22 | Disposition: A | Discharge status: Home / Self Care | Payer: Self-pay | Attending: Emergency Medicine | Admitting: Emergency Medicine

## 2018-12-21 DIAGNOSIS — N509 Disorder of male genital organs, unspecified: Secondary | ICD-10-CM | POA: Insufficient documentation

## 2018-12-21 DIAGNOSIS — N529 Male erectile dysfunction, unspecified: Secondary | ICD-10-CM | POA: Insufficient documentation

## 2018-12-21 DIAGNOSIS — F17210 Nicotine dependence, cigarettes, uncomplicated: Secondary | ICD-10-CM | POA: Insufficient documentation

## 2018-12-21 NOTE — ED Triage Notes (Signed)
Pt here with c/o of a cyst on his testicle it has been there since 2016 when I had it looked at was told it was ok , no pain , no burning on urination no discharge , pt is unable to obtain an erection
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
## 2018-12-21 NOTE — ED Notes (Addendum)
Pt reports mass on testicle "for years" but is now "bigger than it was."  He also would like to be checked to "find out why I can keep an erection."  Pt denies any pain, no pain w/ urination, no drainage, no risk for STDs.  RN bedside w/ provider during exam.

## 2018-12-22 ENCOUNTER — Emergency Department (HOSPITAL_COMMUNITY): Payer: Self-pay

## 2018-12-22 LAB — URINALYSIS, ROUTINE W REFLEX MICROSCOPIC
Bacteria, UA: NONE SEEN
Bilirubin Urine: NEGATIVE
Glucose, UA: NEGATIVE mg/dL
Hgb urine dipstick: NEGATIVE
Ketones, ur: NEGATIVE mg/dL
Leukocytes,Ua: NEGATIVE
Nitrite: NEGATIVE
Protein, ur: 30 mg/dL — AB
Specific Gravity, Urine: 1.03 (ref 1.005–1.030)
pH: 5 (ref 5.0–8.0)

## 2018-12-22 LAB — GC/CHLAMYDIA PROBE AMP (~~LOC~~) NOT AT ARMC
Chlamydia: NEGATIVE
Neisseria Gonorrhea: NEGATIVE

## 2018-12-22 IMAGING — US ULTRASOUND OF SCROTUM
1 series · 13 of 25 positions shown · non-contrast
Comparison: None.

CLINICAL DATA: 38-year-old male with left scrotal palpable lump.

EXAM:
SCROTAL ULTRASOUND
DOPPLER ULTRASOUND OF THE TESTICLES
TECHNIQUE: Complete ultrasound examination of the testicles, epididymis, and
other scrotal structures was performed. Color and spectral Doppler
ultrasound were also utilized to evaluate blood flow to the
testicles.

[Series 1: ultrasound of scrotum · 13 of 55 slices shown]
[im 1/55]
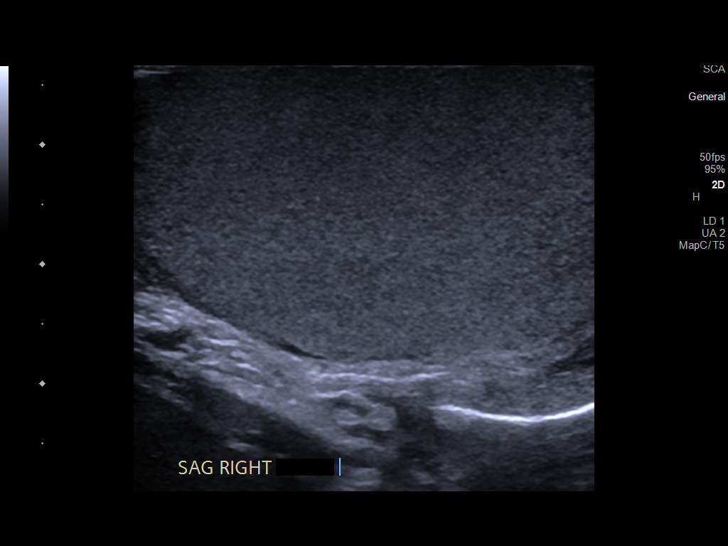
[im 5/55]
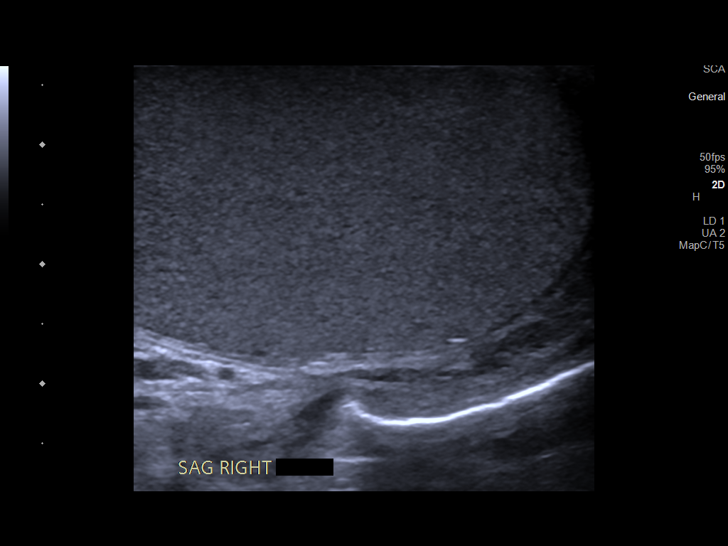
[im 10/55]
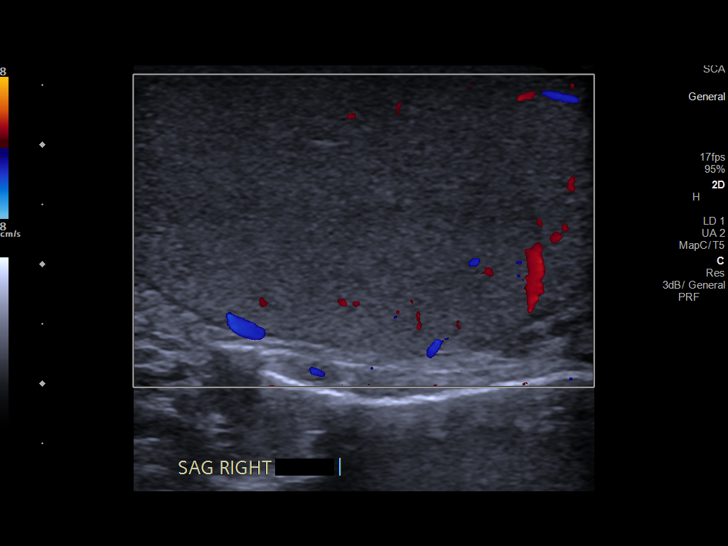
[im 14/55]
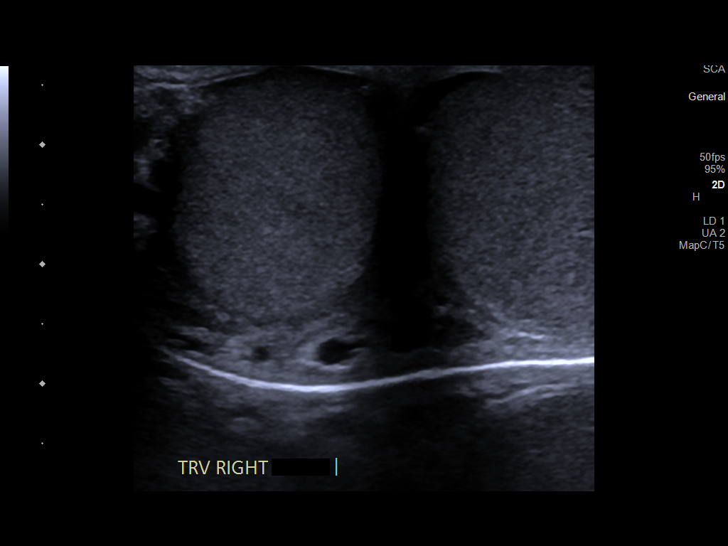
[im 19/55]
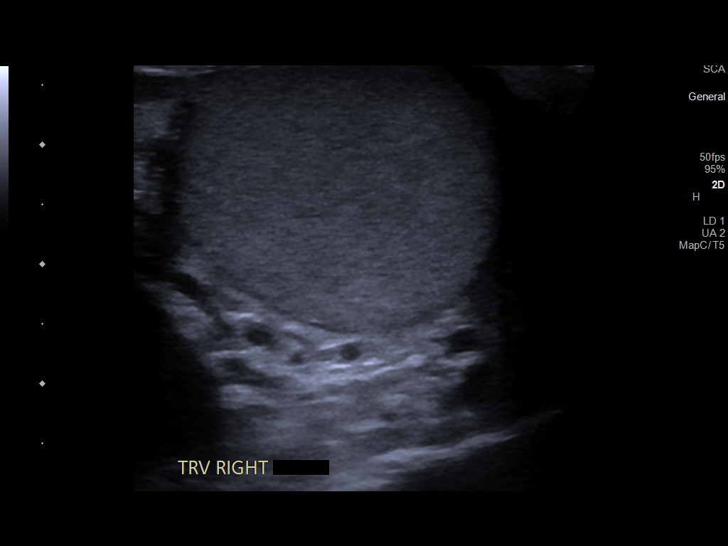
[im 23/55]
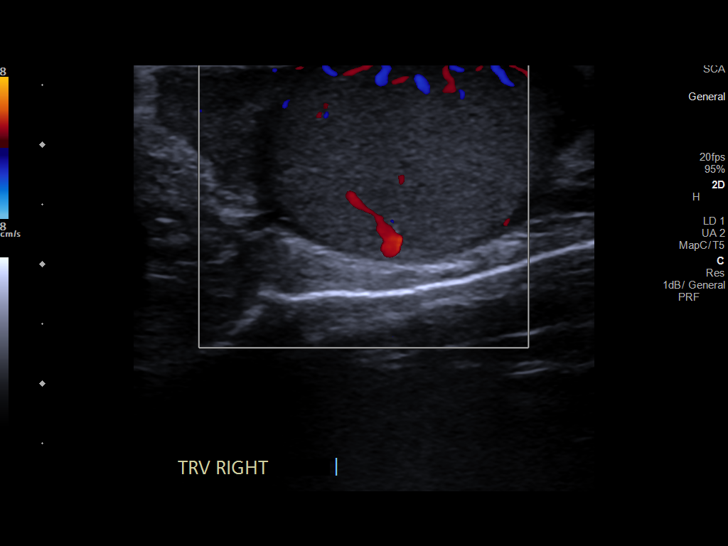
[im 28/55]
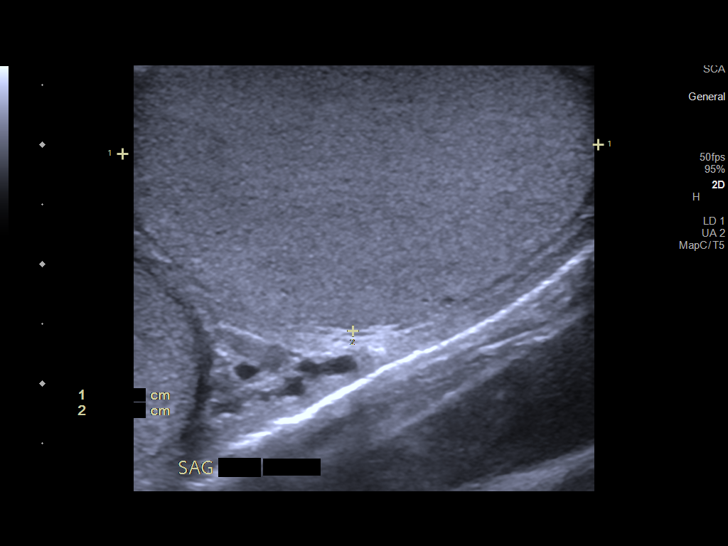
[im 32/55]
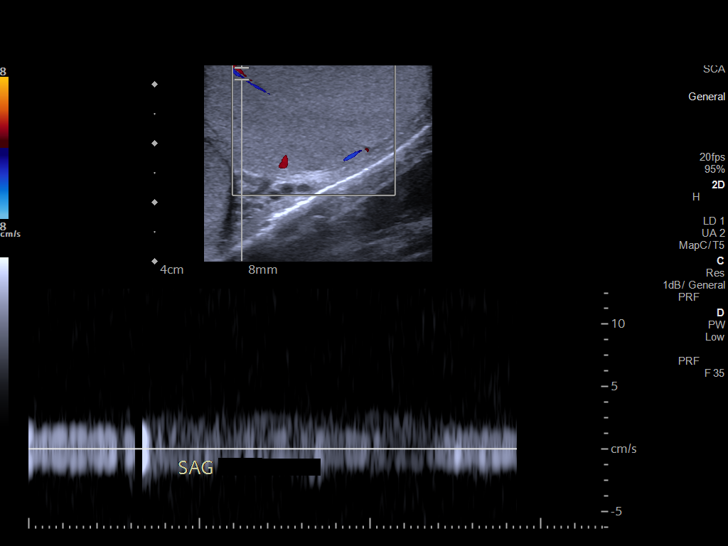
[im 37/55]
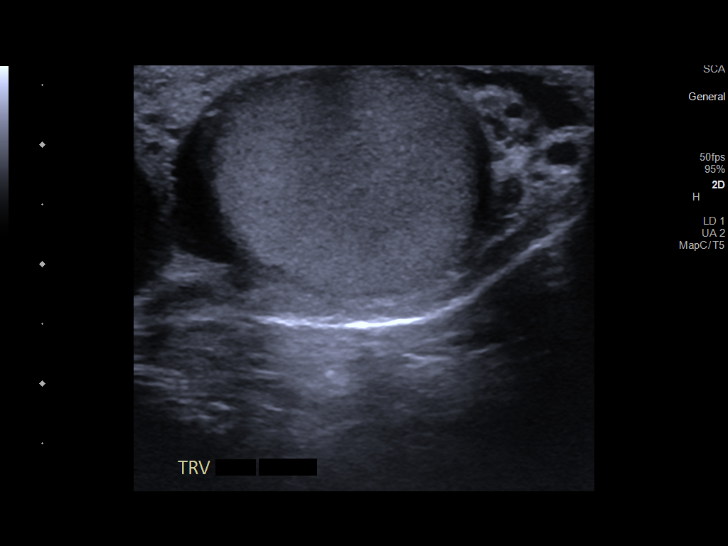
[im 41/55]
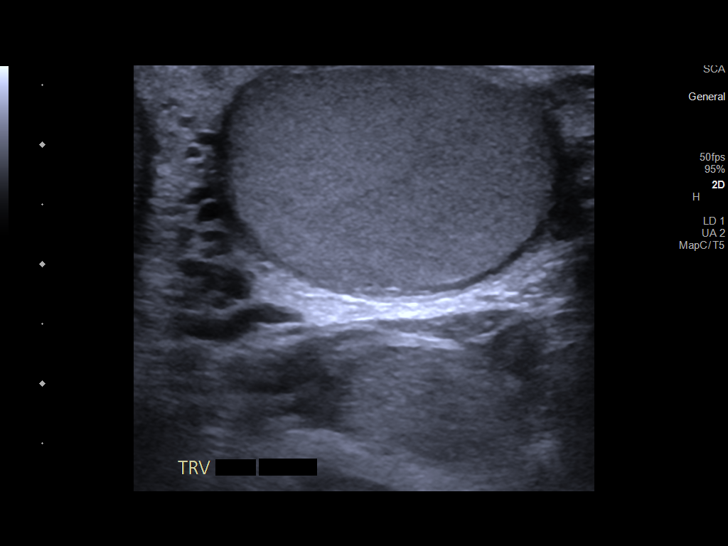
[im 46/55]
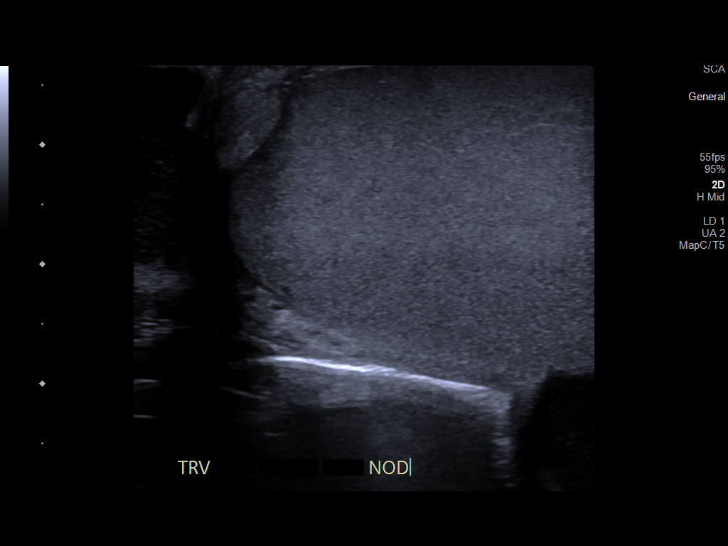
[im 50/55]
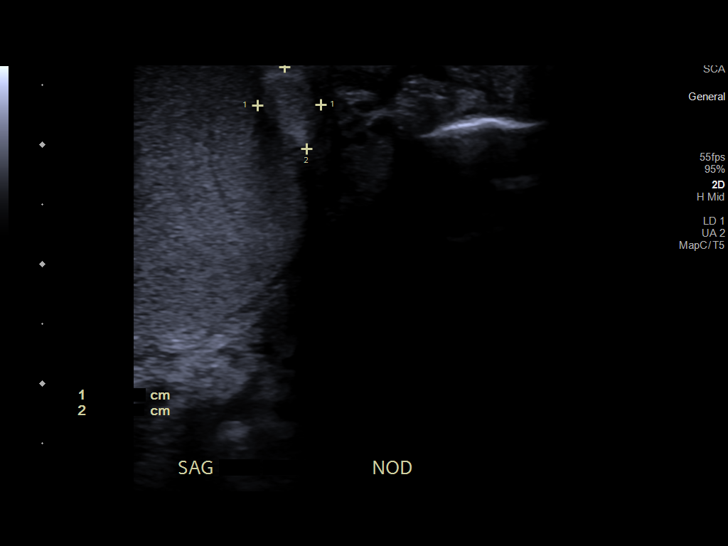
[im 55/55]
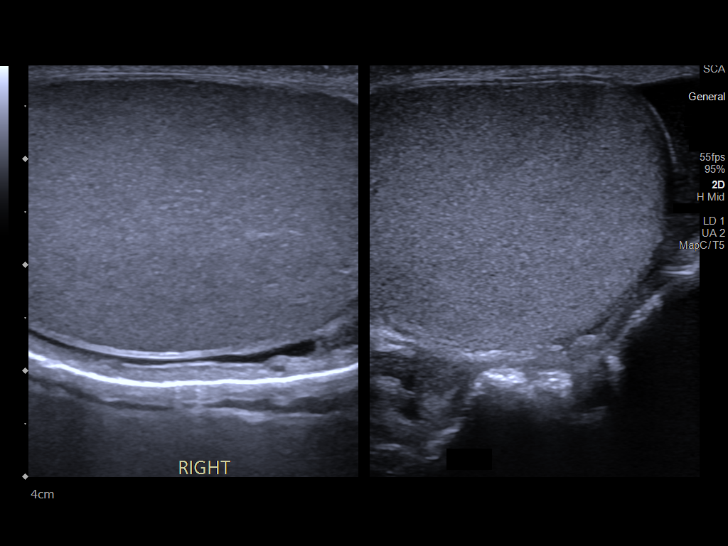

[13 of 25 positions shown; findings below may reference images not displayed]

FINDINGS: Right testicle

Measurements: 3.9 x 2.4 x 2.9 cm. No mass or microlithiasis
visualized.

Left testicle

Measurements: 4.0 x 2.3 x 2.9 cm. The left testicle demonstrates a
normal echogenicity. There is a 0.6 x 1.0 x 0.8 cm ovoid echogenic
lesion along the posterior left testicle with mild mass effect and
indentation of the tunica. Doppler images demonstrate flow within
this lesion. This most likely represents an extratesticular mass or
a tunica based lesion.

Right epididymis:  Normal in size and appearance.

Left epididymis:  Normal in size and appearance.

Hydrocele:  None visualized.

Varicocele:  None visualized.

Pulsed Doppler interrogation of both testes demonstrates normal low
resistance arterial and venous waveforms bilaterally.
IMPRESSION: Tunica based ovoid echogenic lesion with internal vascularity along
the posterior left testicle. This lesion appears extratesticular and
most likely benign in etiology and favored to represent adenomatoid
tumor of the testicular tunica. Further characterization with MRI on
a nonemergent basis recommended.

## 2018-12-22 NOTE — Discharge Instructions (Addendum)
You were seen today for testicular lesion and erectile dysfunction.  Your urinalysis was reassuring.  Your ultrasound shows likely a benign lesion on your testicle.  This means that this is likely not cancerous.  You should have follow-up MRI on an outpatient basis.  Follow-up with urology regarding your issues with erectile dysfunction and this lesion.

## 2018-12-22 NOTE — ED Notes (Signed)
Patient transported to Ultrasound 

## 2019-06-30 ENCOUNTER — Inpatient Hospital Stay (HOSPITAL_COMMUNITY)
Admission: EM | Admit: 2019-06-30 | Discharge: 2019-07-14 | DRG: 207 | Disposition: A | Discharge status: Home / Self Care | Payer: Self-pay | Attending: Internal Medicine | Admitting: Internal Medicine

## 2019-06-30 ENCOUNTER — Encounter (HOSPITAL_COMMUNITY): Payer: Self-pay | Admitting: Emergency Medicine

## 2019-06-30 ENCOUNTER — Other Ambulatory Visit: Payer: Self-pay

## 2019-06-30 ENCOUNTER — Emergency Department (HOSPITAL_COMMUNITY): Payer: Self-pay

## 2019-06-30 ENCOUNTER — Encounter: Payer: Self-pay | Admitting: Physician Assistant

## 2019-06-30 DIAGNOSIS — J9691 Respiratory failure, unspecified with hypoxia: Secondary | ICD-10-CM

## 2019-06-30 DIAGNOSIS — Q211 Atrial septal defect: Secondary | ICD-10-CM

## 2019-06-30 DIAGNOSIS — T17908A Unspecified foreign body in respiratory tract, part unspecified causing other injury, initial encounter: Secondary | ICD-10-CM

## 2019-06-30 DIAGNOSIS — I469 Cardiac arrest, cause unspecified: Secondary | ICD-10-CM | POA: Diagnosis present

## 2019-06-30 DIAGNOSIS — J9601 Acute respiratory failure with hypoxia: Principal | ICD-10-CM | POA: Diagnosis present

## 2019-06-30 DIAGNOSIS — K567 Ileus, unspecified: Secondary | ICD-10-CM | POA: Diagnosis not present

## 2019-06-30 DIAGNOSIS — Z8249 Family history of ischemic heart disease and other diseases of the circulatory system: Secondary | ICD-10-CM

## 2019-06-30 DIAGNOSIS — Z20822 Contact with and (suspected) exposure to covid-19: Secondary | ICD-10-CM | POA: Diagnosis present

## 2019-06-30 DIAGNOSIS — F1210 Cannabis abuse, uncomplicated: Secondary | ICD-10-CM | POA: Diagnosis present

## 2019-06-30 DIAGNOSIS — E872 Acidosis: Secondary | ICD-10-CM | POA: Diagnosis present

## 2019-06-30 DIAGNOSIS — Q2112 Patent foramen ovale: Secondary | ICD-10-CM

## 2019-06-30 DIAGNOSIS — I480 Paroxysmal atrial fibrillation: Secondary | ICD-10-CM

## 2019-06-30 DIAGNOSIS — G9341 Metabolic encephalopathy: Secondary | ICD-10-CM | POA: Diagnosis present

## 2019-06-30 DIAGNOSIS — Z0189 Encounter for other specified special examinations: Secondary | ICD-10-CM

## 2019-06-30 DIAGNOSIS — F17200 Nicotine dependence, unspecified, uncomplicated: Secondary | ICD-10-CM

## 2019-06-30 DIAGNOSIS — D72829 Elevated white blood cell count, unspecified: Secondary | ICD-10-CM | POA: Diagnosis not present

## 2019-06-30 DIAGNOSIS — F1010 Alcohol abuse, uncomplicated: Secondary | ICD-10-CM | POA: Diagnosis present

## 2019-06-30 DIAGNOSIS — J9600 Acute respiratory failure, unspecified whether with hypoxia or hypercapnia: Secondary | ICD-10-CM

## 2019-06-30 DIAGNOSIS — K7200 Acute and subacute hepatic failure without coma: Secondary | ICD-10-CM | POA: Diagnosis present

## 2019-06-30 DIAGNOSIS — G9340 Encephalopathy, unspecified: Secondary | ICD-10-CM

## 2019-06-30 DIAGNOSIS — F1410 Cocaine abuse, uncomplicated: Secondary | ICD-10-CM

## 2019-06-30 DIAGNOSIS — E876 Hypokalemia: Secondary | ICD-10-CM | POA: Diagnosis present

## 2019-06-30 DIAGNOSIS — R140 Abdominal distension (gaseous): Secondary | ICD-10-CM

## 2019-06-30 DIAGNOSIS — F17210 Nicotine dependence, cigarettes, uncomplicated: Secondary | ICD-10-CM | POA: Diagnosis present

## 2019-06-30 DIAGNOSIS — G8250 Quadriplegia, unspecified: Secondary | ICD-10-CM | POA: Diagnosis not present

## 2019-06-30 DIAGNOSIS — R748 Abnormal levels of other serum enzymes: Secondary | ICD-10-CM

## 2019-06-30 DIAGNOSIS — J690 Pneumonitis due to inhalation of food and vomit: Secondary | ICD-10-CM | POA: Diagnosis not present

## 2019-06-30 DIAGNOSIS — Z8674 Personal history of sudden cardiac arrest: Secondary | ICD-10-CM

## 2019-06-30 DIAGNOSIS — R0902 Hypoxemia: Secondary | ICD-10-CM

## 2019-06-30 DIAGNOSIS — Z23 Encounter for immunization: Secondary | ICD-10-CM

## 2019-06-30 DIAGNOSIS — N179 Acute kidney failure, unspecified: Secondary | ICD-10-CM | POA: Diagnosis present

## 2019-06-30 DIAGNOSIS — T17908D Unspecified foreign body in respiratory tract, part unspecified causing other injury, subsequent encounter: Secondary | ICD-10-CM

## 2019-06-30 DIAGNOSIS — Z79899 Other long term (current) drug therapy: Secondary | ICD-10-CM

## 2019-06-30 DIAGNOSIS — R001 Bradycardia, unspecified: Secondary | ICD-10-CM | POA: Diagnosis present

## 2019-06-30 DIAGNOSIS — G931 Anoxic brain damage, not elsewhere classified: Secondary | ICD-10-CM | POA: Diagnosis present

## 2019-06-30 DIAGNOSIS — F1910 Other psychoactive substance abuse, uncomplicated: Secondary | ICD-10-CM

## 2019-06-30 DIAGNOSIS — R1110 Vomiting, unspecified: Secondary | ICD-10-CM

## 2019-06-30 DIAGNOSIS — T17908S Unspecified foreign body in respiratory tract, part unspecified causing other injury, sequela: Secondary | ICD-10-CM

## 2019-06-30 DIAGNOSIS — D649 Anemia, unspecified: Secondary | ICD-10-CM | POA: Diagnosis not present

## 2019-06-30 HISTORY — DX: Unspecified atrial fibrillation: I48.91

## 2019-06-30 HISTORY — DX: Other psychoactive substance abuse, uncomplicated: F19.10

## 2019-06-30 HISTORY — DX: Tobacco use: Z72.0

## 2019-06-30 LAB — URINALYSIS, ROUTINE W REFLEX MICROSCOPIC
Bilirubin Urine: NEGATIVE
Glucose, UA: >=500 mg/dL — AB
Hgb urine dipstick: NEGATIVE
Ketones, ur: NEGATIVE mg/dL
Leukocytes,Ua: NEGATIVE
Nitrite: NEGATIVE
Protein, ur: 100 mg/dL — AB
Specific Gravity, Urine: 1.011 (ref 1.005–1.030)
pH: 7 (ref 5.0–8.0)

## 2019-06-30 LAB — CBC WITH DIFFERENTIAL/PLATELET
Abs Immature Granulocytes: 0.12 10*3/uL — ABNORMAL HIGH (ref 0.00–0.07)
Basophils Absolute: 0 10*3/uL (ref 0.0–0.1)
Basophils Relative: 0 %
Eosinophils Absolute: 0.3 10*3/uL (ref 0.0–0.5)
Eosinophils Relative: 3 %
HCT: 43.1 % (ref 39.0–52.0)
Hemoglobin: 13.8 g/dL (ref 13.0–17.0)
Immature Granulocytes: 1 %
Lymphocytes Relative: 52 %
Lymphs Abs: 4.7 10*3/uL — ABNORMAL HIGH (ref 0.7–4.0)
MCH: 30.9 pg (ref 26.0–34.0)
MCHC: 32 g/dL (ref 30.0–36.0)
MCV: 96.6 fL (ref 80.0–100.0)
Monocytes Absolute: 0.4 10*3/uL (ref 0.1–1.0)
Monocytes Relative: 4 %
Neutro Abs: 3.7 10*3/uL (ref 1.7–7.7)
Neutrophils Relative %: 40 %
Platelets: 264 10*3/uL (ref 150–400)
RBC: 4.46 MIL/uL (ref 4.22–5.81)
RDW: 12.3 % (ref 11.5–15.5)
WBC: 9.3 10*3/uL (ref 4.0–10.5)
nRBC: 0 % (ref 0.0–0.2)

## 2019-06-30 LAB — COMPREHENSIVE METABOLIC PANEL
ALT: 66 U/L — ABNORMAL HIGH (ref 0–44)
AST: 66 U/L — ABNORMAL HIGH (ref 15–41)
Albumin: 3.6 g/dL (ref 3.5–5.0)
Alkaline Phosphatase: 50 U/L (ref 38–126)
Anion gap: 16 — ABNORMAL HIGH (ref 5–15)
BUN: 20 mg/dL (ref 6–20)
CO2: 17 mmol/L — ABNORMAL LOW (ref 22–32)
Calcium: 8.3 mg/dL — ABNORMAL LOW (ref 8.9–10.3)
Chloride: 108 mmol/L (ref 98–111)
Creatinine, Ser: 1.67 mg/dL — ABNORMAL HIGH (ref 0.61–1.24)
GFR calc Af Amer: 59 mL/min — ABNORMAL LOW (ref >60)
GFR calc non Af Amer: 51 mL/min — ABNORMAL LOW (ref >60)
Glucose, Bld: 235 mg/dL — ABNORMAL HIGH (ref 70–99)
Potassium: 2.9 mmol/L — ABNORMAL LOW (ref 3.5–5.1)
Sodium: 141 mmol/L (ref 135–145)
Total Bilirubin: 0.7 mg/dL (ref 0.3–1.2)
Total Protein: 5.9 g/dL — ABNORMAL LOW (ref 6.5–8.1)

## 2019-06-30 LAB — POCT I-STAT 7, (LYTES, BLD GAS, ICA,H+H)
Acid-base deficit: 5 mmol/L — ABNORMAL HIGH (ref 0.0–2.0)
Bicarbonate: 20.2 mmol/L (ref 20.0–28.0)
Calcium, Ion: 1.14 mmol/L — ABNORMAL LOW (ref 1.15–1.40)
HCT: 39 % (ref 39.0–52.0)
Hemoglobin: 13.3 g/dL (ref 13.0–17.0)
O2 Saturation: 100 %
Patient temperature: 93.5
Potassium: 3.4 mmol/L — ABNORMAL LOW (ref 3.5–5.1)
Sodium: 143 mmol/L (ref 135–145)
TCO2: 21 mmol/L — ABNORMAL LOW (ref 22–32)
pCO2 arterial: 34.1 mmHg (ref 32.0–48.0)
pH, Arterial: 7.368 (ref 7.350–7.450)
pO2, Arterial: 430 mmHg — ABNORMAL HIGH (ref 83.0–108.0)

## 2019-06-30 LAB — PROTIME-INR
INR: 1.1 (ref 0.8–1.2)
Prothrombin Time: 14 seconds (ref 11.4–15.2)

## 2019-06-30 LAB — POC SARS CORONAVIRUS 2 AG -  ED: SARS Coronavirus 2 Ag: NEGATIVE

## 2019-06-30 LAB — LACTIC ACID, PLASMA: Lactic Acid, Venous: 6 mmol/L (ref 0.5–1.9)

## 2019-06-30 LAB — RESPIRATORY PANEL BY RT PCR (FLU A&B, COVID)
Influenza A by PCR: NEGATIVE
Influenza B by PCR: NEGATIVE
SARS Coronavirus 2 by RT PCR: NEGATIVE

## 2019-06-30 LAB — CBG MONITORING, ED: Glucose-Capillary: 219 mg/dL — ABNORMAL HIGH (ref 70–99)

## 2019-06-30 LAB — TROPONIN I (HIGH SENSITIVITY): Troponin I (High Sensitivity): 11 ng/L (ref <18)

## 2019-06-30 IMAGING — CT CT HEAD W/O CM
4 series · 17 of 47 positions shown, 19 images · non-contrast
Comparison: CT brain [DATE]

CLINICAL DATA: Altered mental status

EXAM:
CT HEAD WITHOUT CONTRAST
TECHNIQUE: Contiguous axial images were obtained from the base of the skull
through the vertex without intravenous contrast.

[Series 3: head wo · axial · 0.45mm/px · z∈[+1132,+1256]mm · 7 of 35 slices shown, 9 images]
[im 5/35  brain]
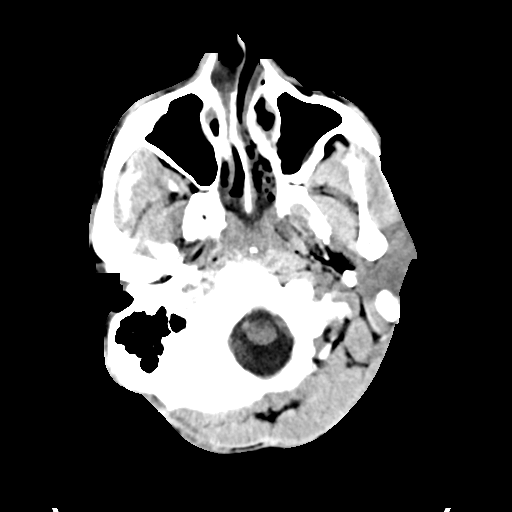
[im 5/35  bone]
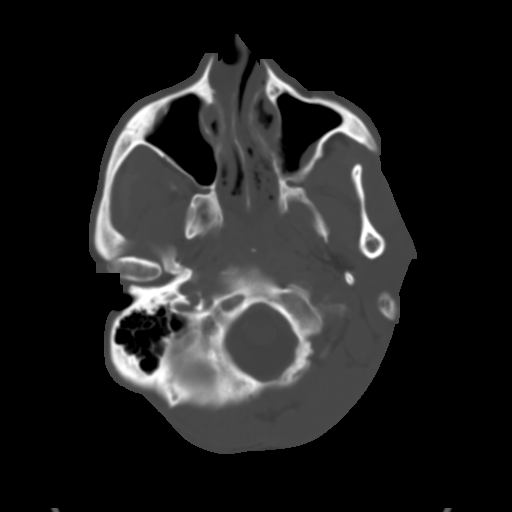
[im 9/35  brain]
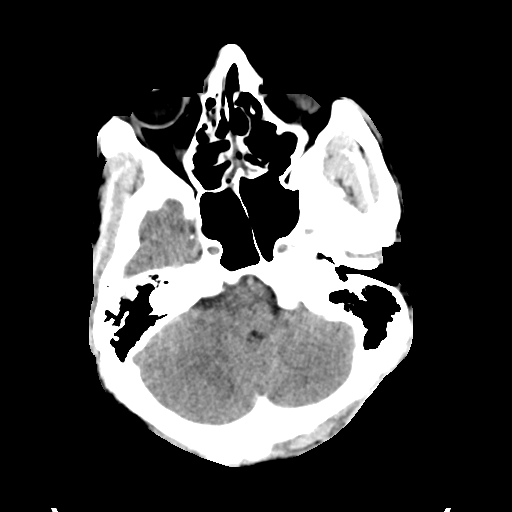
[im 13/35  brain]
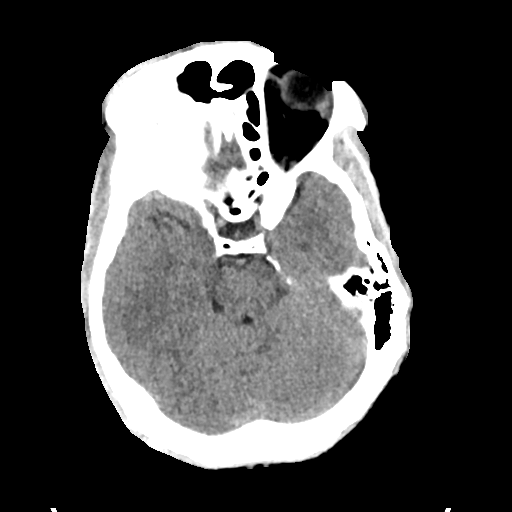
[im 18/35  brain]
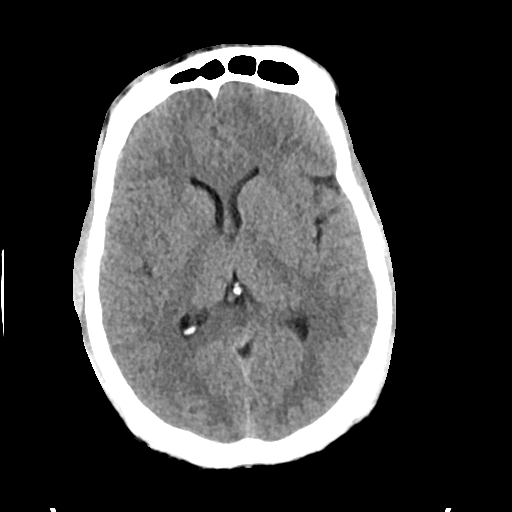
[im 22/35  brain]
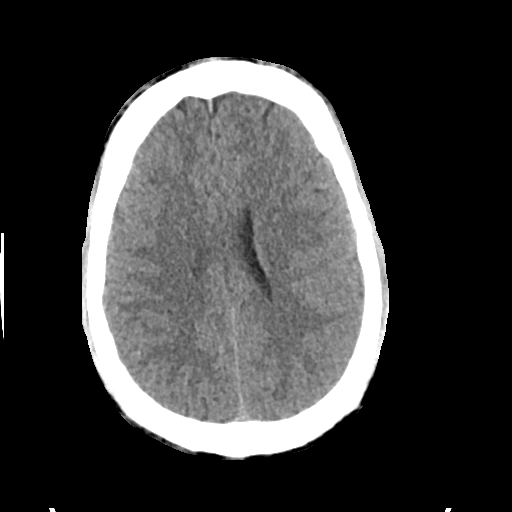
[im 22/35  bone]
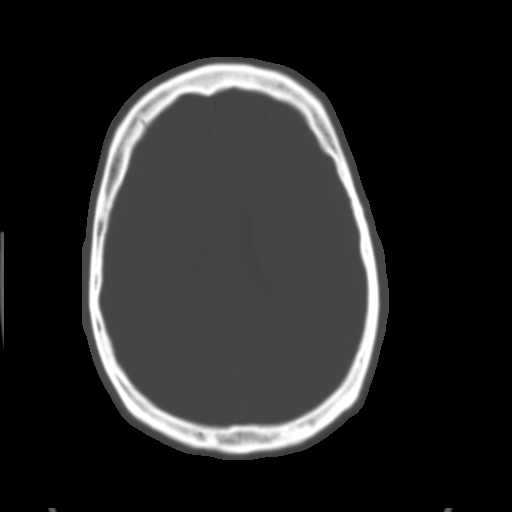
[im 26/35  brain]
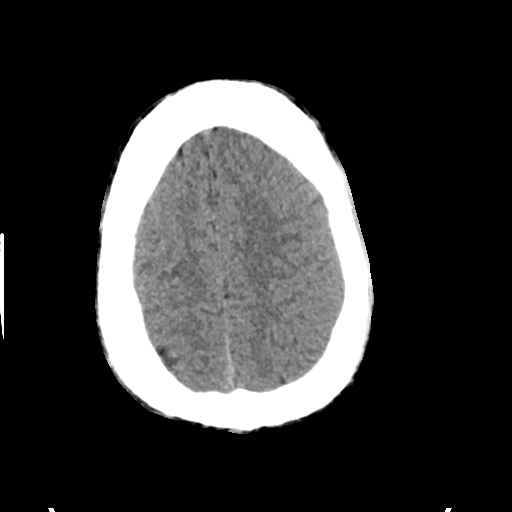
[im 30/35  brain]
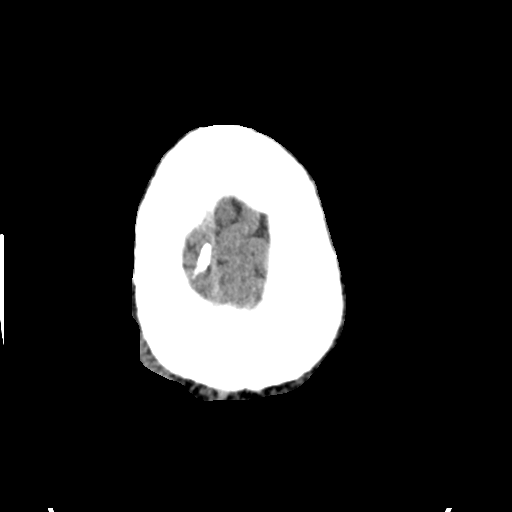

[Series 4: head bone · axial · 0.45mm/px · z∈[+1128,+1188]mm · 4 of 86 slices shown]
[im 9/86  bone]
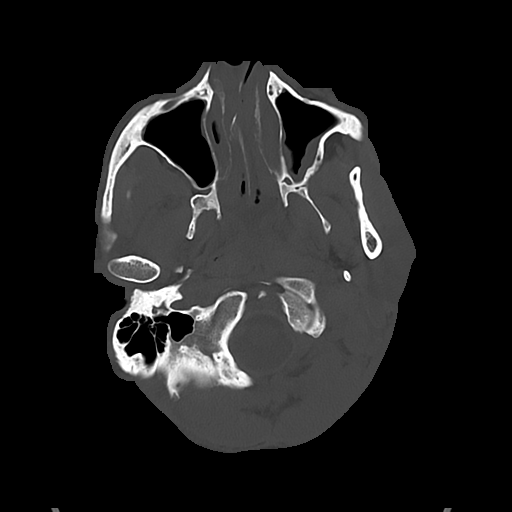
[im 18/86  bone]
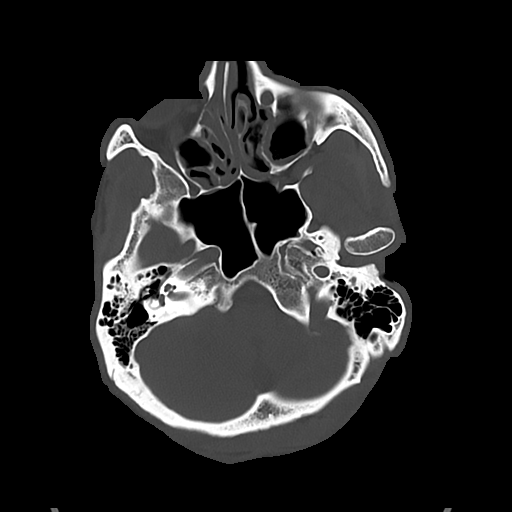
[im 26/86  bone]
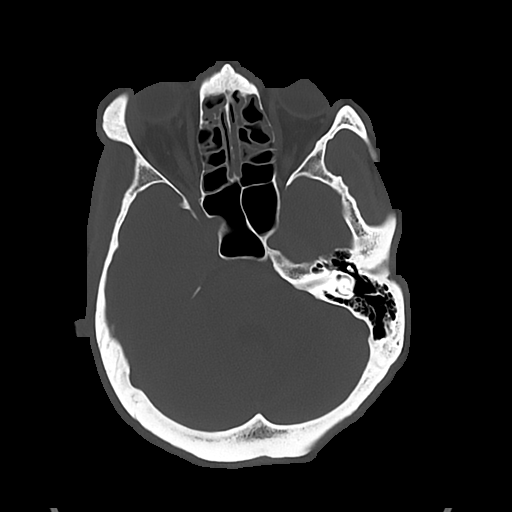
[im 39/86  bone]
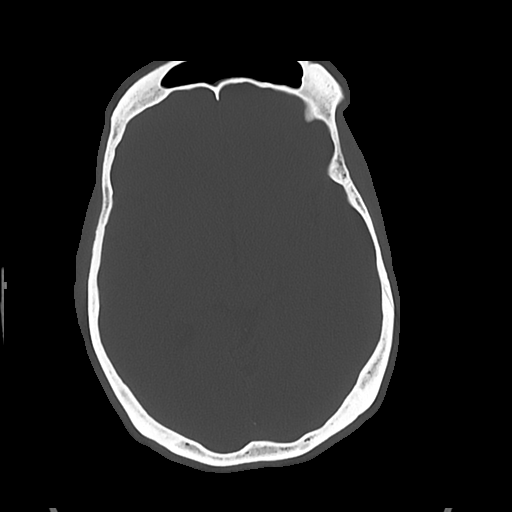

[Series 5: cor soft · coronal · 0.35mm/px · 3 of 74 slices shown]
[im 25/74  brain]
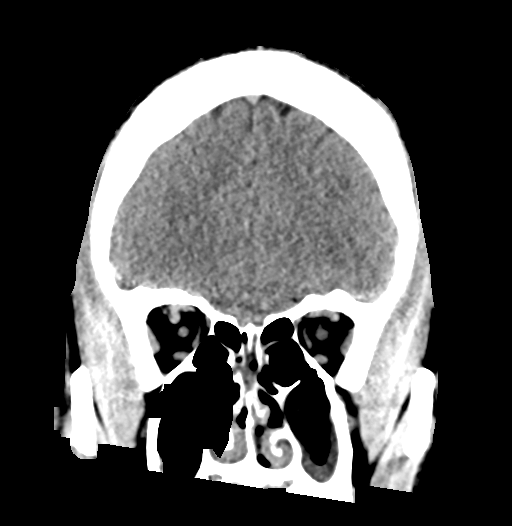
[im 33/74  brain]
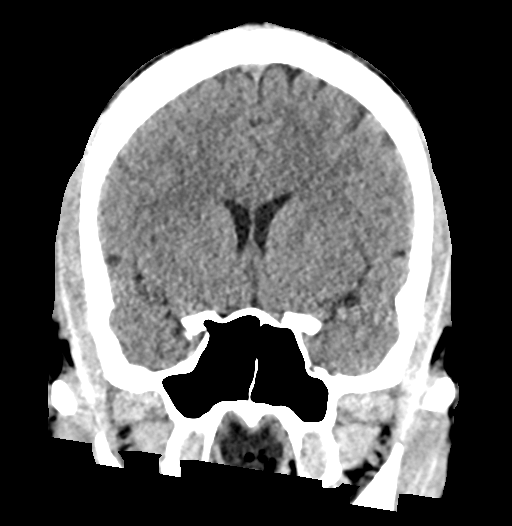
[im 41/74  brain]
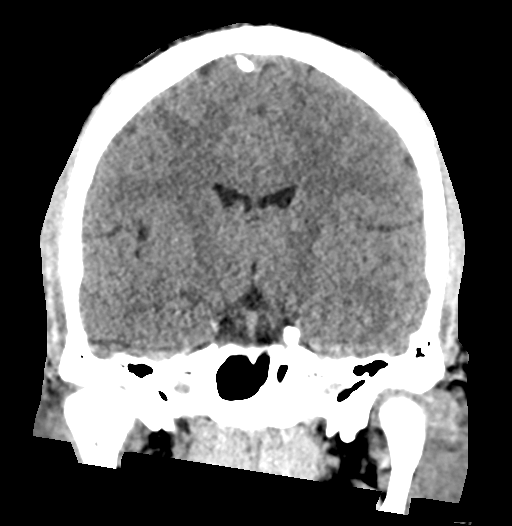

[Series 6: sag soft · sagittal · 0.36mm/px · 3 of 59 slices shown]
[im 23/59  brain]
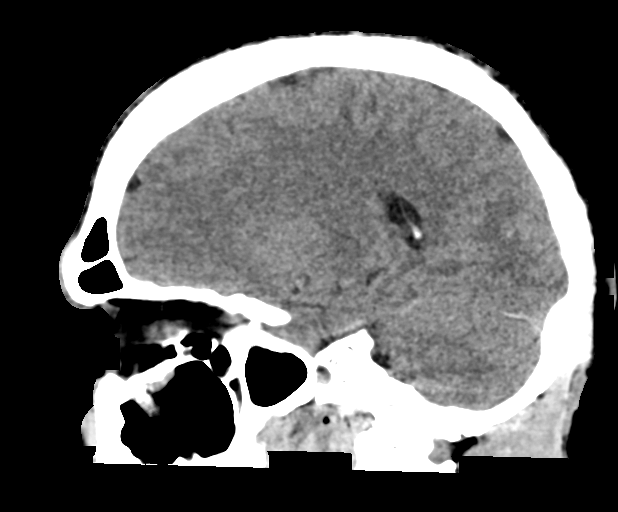
[im 30/59  brain]
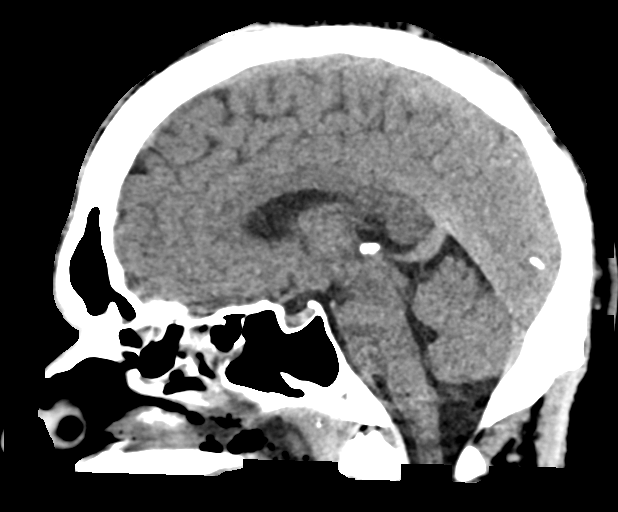
[im 37/59  brain]
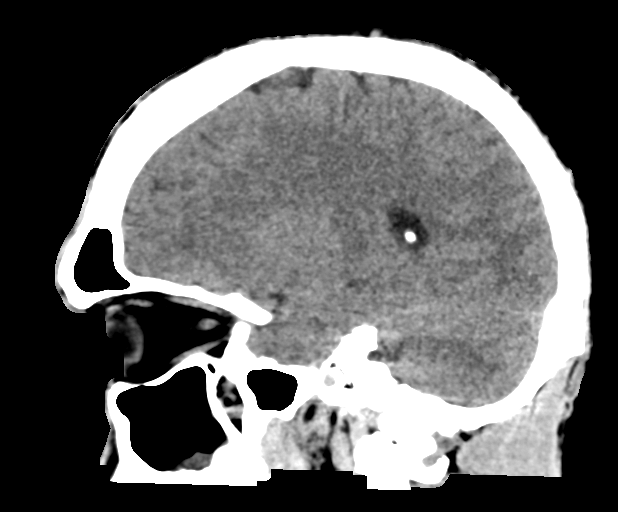

[17 of 47 positions shown; findings below may reference images not displayed]

FINDINGS: Brain: No acute territorial infarction, hemorrhage, or intracranial
mass is visualized. The ventricles are nonenlarged.

Vascular: No hyperdense vessel or unexpected calcification.

Skull: Normal. Negative for fracture or focal lesion.

Sinuses/Orbits: Moderate mucosal thickening in the maxillary and
ethmoid sinuses with mild mucosal thickening in the sphenoid and
frontal sinuses. Moderate fluid and debris in the nasopharynx.

Other: None
IMPRESSION: 1. No CT evidence for acute intracranial abnormality.
2. Sinus disease.  Fluid and debris in the nasopharynx.

## 2019-06-30 IMAGING — DX DG CHEST 1V PORT
1 series · 1 of 1 positions shown · non-contrast
Comparison: [DATE]

CLINICAL DATA: Cardiac arrest

EXAM:
PORTABLE CHEST 1 VIEW

[chest]
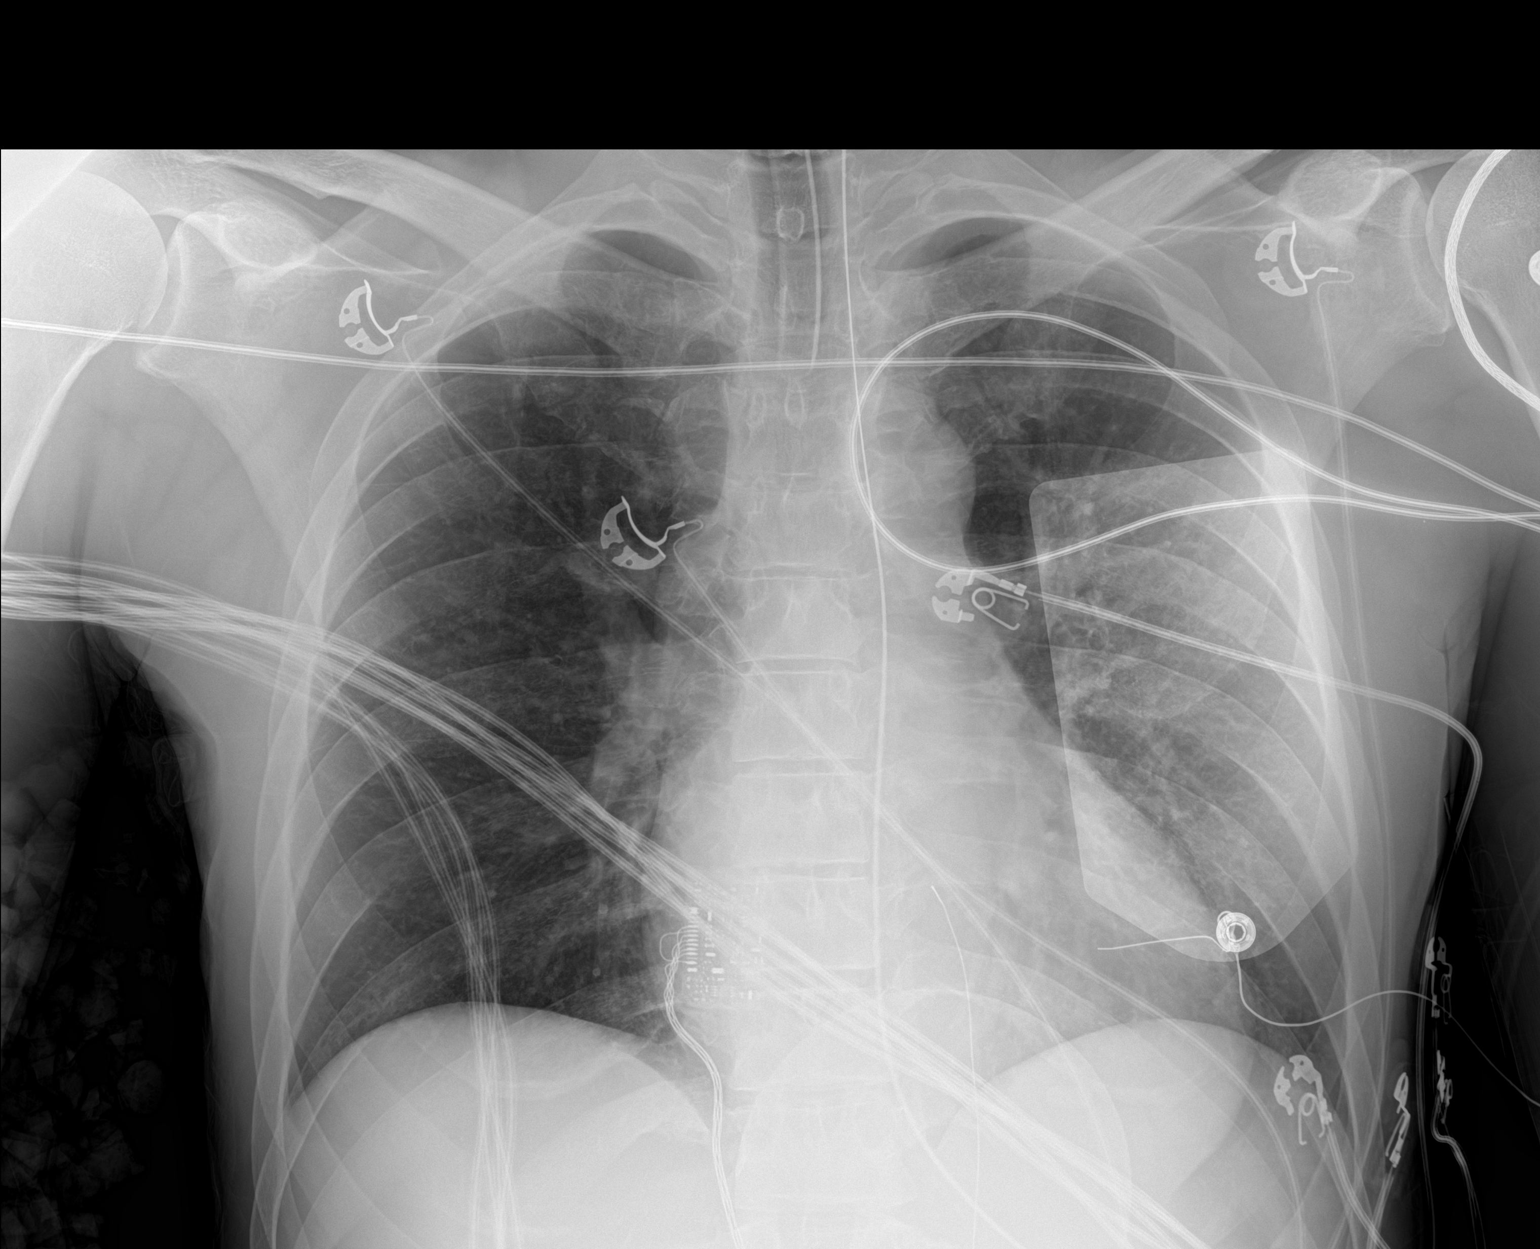

[1 of 1 positions shown; findings below may reference images not displayed]

FINDINGS: The endotracheal tube terminates approximately 5.2 cm above the
carina. The OG tube terminates below the left hemidiaphragm. There
is no pneumothorax or large pleural effusion. There is no focal
infiltrate. There is no acute displaced rib fracture.
IMPRESSION: 1. Satisfactory position of the endotracheal tube.
2. OG tube terminates below the left hemidiaphragm.
3. No acute cardiopulmonary process.

## 2019-06-30 MED ORDER — SODIUM CHLORIDE 0.9 % IV BOLUS
1000.0000 mL | Freq: Once | INTRAVENOUS | Status: AC
  Administered 2019-06-30: 1000 mL via INTRAVENOUS

## 2019-06-30 MED ORDER — PROPOFOL 1000 MG/100ML IV EMUL: 5.0000 ug/kg/min | INTRAVENOUS | Status: DC

## 2019-06-30 MED ORDER — FENTANYL CITRATE (PF) 100 MCG/2ML IJ SOLN
50.0000 ug | Freq: Once | INTRAMUSCULAR | Status: AC
  Administered 2019-06-30: 50 ug via INTRAVENOUS

## 2019-06-30 MED ORDER — ROCURONIUM BROMIDE 50 MG/5ML IV SOLN
INTRAVENOUS | Status: AC | PRN
  Administered 2019-06-30: 80 mg via INTRAVENOUS

## 2019-06-30 MED ORDER — MAGNESIUM SULFATE 2 GM/50ML IV SOLN
2.0000 g | Freq: Once | INTRAVENOUS | Status: AC
  Administered 2019-07-01: 2 g via INTRAVENOUS
  Filled 2019-06-30: qty 50

## 2019-06-30 MED ORDER — ETOMIDATE 2 MG/ML IV SOLN
INTRAVENOUS | Status: AC | PRN
  Administered 2019-06-30: 20 mg via INTRAVENOUS

## 2019-06-30 MED ORDER — CALCIUM GLUCONATE-NACL 2-0.675 GM/100ML-% IV SOLN
2.0000 g | Freq: Once | INTRAVENOUS | Status: AC
  Administered 2019-07-01: 2000 mg via INTRAVENOUS
  Filled 2019-06-30: qty 100

## 2019-06-30 MED ORDER — SODIUM CHLORIDE 0.9 % IV SOLN: Freq: Once | INTRAVENOUS | Status: AC

## 2019-06-30 MED ORDER — FENTANYL 2500MCG IN NS 250ML (10MCG/ML) PREMIX INFUSION
0.0000 ug/h | INTRAVENOUS | Status: DC
  Administered 2019-06-30 – 2019-07-01 (×4): 50 ug/h via INTRAVENOUS
  Administered 2019-07-02 (×3): 350 ug/h via INTRAVENOUS
  Administered 2019-07-02: 400 ug/h via INTRAVENOUS
  Administered 2019-07-03: 300 ug/h via INTRAVENOUS
  Filled 2019-06-30 (×5): qty 250

## 2019-06-30 MED ORDER — POTASSIUM CHLORIDE 10 MEQ/100ML IV SOLN
10.0000 meq | INTRAVENOUS | Status: AC
  Administered 2019-06-30 – 2019-07-01 (×4): 10 meq via INTRAVENOUS
  Filled 2019-06-30 (×4): qty 100

## 2019-06-30 NOTE — ED Notes (Signed)
Icepacks placed in bilateral axilla and groin. 
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
## 2019-06-30 NOTE — ED Provider Notes (Signed)
[facility] EMERGENCY DEPARTMENT Provider Note   CSN: [id number] Arrival date & time: 06/30/19  2152     History Chief Complaint  Patient presents with  . Cardiac Arrest    Pedro Gomez is a 39 y.o. male.  HPI Pedro Gomez is a 39 y.o. male with a medical history of atrial fibrillation who presents to the ED as a post arrest. He became unresponsive without a pulse while in the car an ago with his girlfriend, he was the passenger. She pulled over, started cpr and called 911. EMS and fire performed cpr for a total of 4 minutes, gave him x1 epi and shocked him, achieved rosc. He remained with pulse but a gcs of 3 upon arrival. No further history obtained.      History reviewed. No pertinent past medical history.  Patient Active Problem List   Diagnosis Date Noted  . Cardiac arrest ([place]) 07/01/2019  . History of atrial fibrillation   . AKI (acute kidney injury) ([place])   . Encephalopathy acute   . Cocaine abuse ([place])   . Polysubstance abuse ([place])   . Paroxysmal atrial fibrillation ([facility])   . PFO (patent foramen ovale)   . Current smoker     History reviewed. No pertinent surgical history.     No family history on file.  Social History   Tobacco Use  . Smoking status: Unknown If Ever Smoked  . Smokeless tobacco: Never Used  Substance Use Topics  . Alcohol use: Not on file  . Drug use: Yes    Types: Marijuana    Home Medications Prior to Admission medications   Medication Sig Start Date End Date Taking? Authorizing Provider  famotidine (PEPCID) 10 MG tablet Take 10 mg by mouth 2 (two) times daily as needed for heartburn or indigestion.   Yes [provider]    Allergies    Patient has no known allergies.  Review of Systems   Review of Systems  Unable to perform ROS: Patient unresponsive    Physical Exam Updated Vital Signs BP 116/71   Pulse 74   Temp 97.9 F (36.6 C) (Core)   Resp 10   Wt 85.5 kg   SpO2 100%    Physical Exam Vitals and nursing note reviewed.  Constitutional:      Appearance: He is ill-appearing.  HENT:     Head: Normocephalic and atraumatic.     Right Ear: External ear normal.     Left Ear: External ear normal.     Nose: Nose normal. No rhinorrhea.     Mouth/Throat:     Mouth: Mucous membranes are moist.     Pharynx: No oropharyngeal exudate or posterior oropharyngeal erythema.  Eyes:     General: No scleral icterus.       Right eye: No discharge.        Left eye: No discharge.     Conjunctiva/sclera: Conjunctivae normal.     Comments: Pupils are 37m bilaterally and sluggish to react to light  Neck:     Vascular: No carotid bruit.  Cardiovascular:     Rate and Rhythm: Normal rate and regular rhythm.     Pulses: Normal pulses.     Heart sounds: Normal heart sounds. No murmur. No friction rub. No gallop.   Pulmonary:     Effort: Respiratory distress present.     Breath sounds: No stridor. No wheezing or rhonchi.     Comments: Coarse lung sounds bilaterally Abdominal:  General: Abdomen is flat. There is no distension.     Palpations: Abdomen is soft. There is no mass.     Hernia: No hernia is present.  Musculoskeletal:        General: No swelling, deformity or signs of injury.     Cervical back: Neck supple. No rigidity.     Right lower leg: No edema.     Left lower leg: No edema.  Skin:    General: Skin is warm and dry.     Capillary Refill: Capillary refill takes less than 2 seconds.     Coloration: Skin is not jaundiced.     Findings: No erythema.  Neurological:     Comments: Gcs 3 Unresponsive and obtunded No response to pain Pupils 38m bilaterally and sluggish to react to light     ED Results / Procedures / Treatments   Labs (all labs ordered are listed, but only abnormal results are displayed) Labs Reviewed  CBC WITH DIFFERENTIAL/PLATELET - Abnormal; Notable for the following components:      Result Value   Lymphs Abs 4.7 (*)    Abs  Immature Granulocytes 0.12 (*)    All other components within normal limits  COMPREHENSIVE METABOLIC PANEL - Abnormal; Notable for the following components:   Potassium 2.9 (*)    CO2 17 (*)    Glucose, Bld 235 (*)    Creatinine, Ser 1.67 (*)    Calcium 8.3 (*)    Total Protein 5.9 (*)    AST 66 (*)    ALT 66 (*)    GFR calc non Af Amer 51 (*)    GFR calc Af Amer 59 (*)    Anion gap 16 (*)    All other components within normal limits  LACTIC ACID, PLASMA - Abnormal; Notable for the following components:   Lactic Acid, Venous 6.0 (*)    All other components within normal limits  LACTIC ACID, PLASMA - Abnormal; Notable for the following components:   Lactic Acid, Venous 4.4 (*)    All other components within normal limits  URINALYSIS, ROUTINE W REFLEX MICROSCOPIC - Abnormal; Notable for the following components:   APPearance CLOUDY (*)    Glucose, UA >=500 (*)    Protein, ur 100 (*)    Bacteria, UA RARE (*)    All other components within normal limits  RAPID URINE DRUG SCREEN, HOSP PERFORMED - Abnormal; Notable for the following components:   Cocaine POSITIVE (*)    Tetrahydrocannabinol POSITIVE (*)    All other components within normal limits  CBC - Abnormal; Notable for the following components:   RBC 4.14 (*)    Hemoglobin 12.8 (*)    All other components within normal limits  COMPREHENSIVE METABOLIC PANEL - Abnormal; Notable for the following components:   CO2 19 (*)    Glucose, Bld 105 (*)    Creatinine, Ser 1.38 (*)    Calcium 8.3 (*)    Total Protein 5.7 (*)    AST 80 (*)    ALT 76 (*)    All other components within normal limits  ACETAMINOPHEN LEVEL - Abnormal; Notable for the following components:   Acetaminophen (Tylenol), Serum <10 (*)    All other components within normal limits  SALICYLATE LEVEL - Abnormal; Notable for the following components:   Salicylate Lvl <<0.9(*)    All other components within normal limits  GLUCOSE, CAPILLARY - Abnormal; Notable  for the following components:   Glucose-Capillary 140 (*)  All other components within normal limits  BASIC METABOLIC PANEL - Abnormal; Notable for the following components:   Chloride 114 (*)    CO2 20 (*)    Creatinine, Ser 1.38 (*)    Calcium 8.0 (*)    All other components within normal limits  CBG MONITORING, ED - Abnormal; Notable for the following components:   Glucose-Capillary 219 (*)    All other components within normal limits  POCT I-STAT 7, (LYTES, BLD GAS, ICA,H+H) - Abnormal; Notable for the following components:   pO2, Arterial 430.0 (*)    TCO2 21 (*)    Acid-base deficit 5.0 (*)    Potassium 3.4 (*)    Calcium, Ion 1.14 (*)    All other components within normal limits  TROPONIN I (HIGH SENSITIVITY) - Abnormal; Notable for the following components:   Troponin I (High Sensitivity) 101 (*)    All other components within normal limits  RESPIRATORY PANEL BY RT PCR (FLU A&B, COVID)  CULTURE, BLOOD (ROUTINE X 2)  CULTURE, BLOOD (ROUTINE X 2)  MRSA PCR SCREENING  URINE CULTURE  PROTIME-INR  HIV ANTIBODY (ROUTINE TESTING W REFLEX)  ETHANOL  TSH  APTT  MAGNESIUM  PHOSPHORUS  HEPATITIS PANEL, ACUTE  LIPID PANEL  GLUCOSE, CAPILLARY  GLUCOSE, CAPILLARY  APTT  PROTIME-INR  GLUCOSE, CAPILLARY  GLUCOSE, CAPILLARY  BLOOD GAS, ARTERIAL  POC SARS CORONAVIRUS 2 AG -  ED  POC SARS CORONAVIRUS 2 AG -  ED  TROPONIN I (HIGH SENSITIVITY)  TROPONIN I (HIGH SENSITIVITY)    EKG EKG Interpretation  Date/Time:  Friday June 30 2019 21:57:18 EST Ventricular Rate:  88 PR Interval:    QRS Duration: 88 QT Interval:  370 QTC Calculation: 448 R Axis:   9 Text Interpretation: Sinus rhythm Biatrial enlargement Abnormal R-wave progression, early transition Repolarization abnormality, prob rate related No previous ECGs available Confirmed by Wandra Arthurs [phone]) on 07/01/2019 11:46:21 AM   Radiology CT Head Wo Contrast  Result Date: 06/30/2019 CLINICAL DATA:  Altered  mental status EXAM: CT HEAD WITHOUT CONTRAST TECHNIQUE: Contiguous axial images were obtained from the base of the skull through the vertex without intravenous contrast. COMPARISON:  CT brain 06/27/2007 FINDINGS: Brain: No acute territorial infarction, hemorrhage, or intracranial mass is visualized. The ventricles are nonenlarged. Vascular: No hyperdense vessel or unexpected calcification. Skull: Normal. Negative for fracture or focal lesion. Sinuses/Orbits: Moderate mucosal thickening in the maxillary and ethmoid sinuses with mild mucosal thickening in the sphenoid and frontal sinuses. Moderate fluid and debris in the nasopharynx. Other: None IMPRESSION: 1. No CT evidence for acute intracranial abnormality. 2. Sinus disease.  Fluid and debris in the nasopharynx. Electronically Signed   By: Donavan Foil M.D.   On: 06/30/2019 23:25   DG Chest Portable 1 View  Result Date: 06/30/2019 CLINICAL DATA:  Cardiac arrest EXAM: PORTABLE CHEST 1 VIEW COMPARISON:  09/28/2017 FINDINGS: The endotracheal tube terminates approximately 5.2 cm above the carina. The OG tube terminates below the left hemidiaphragm. There is no pneumothorax or large pleural effusion. There is no focal infiltrate. There is no acute displaced rib fracture. IMPRESSION: 1. Satisfactory position of the endotracheal tube. 2. OG tube terminates below the left hemidiaphragm. 3. No acute cardiopulmonary process. Electronically Signed   By: Constance Holster M.D.   On: 06/30/2019 22:22   [place] EKG SITE RITE  Result Date: 07/01/2019 If Site Rite image not attached, placement could not be confirmed due to current cardiac rhythm.   Procedures Procedure Name: Intubation  Date/Time: 07/01/2019 2:06 PM Performed by: Era Bumpers, MD Pre-anesthesia Checklist: Patient identified and Emergency Drugs available Preoxygenation: Pre-oxygenation with 100% oxygen Induction Type: Rapid sequence Laryngoscope Size: Mac and 4 Grade View: Grade III Tube size: 7.5  mm Number of attempts: 1 Airway Equipment and Method: Video-laryngoscopy Placement Confirmation: ETT inserted through vocal cords under direct vision Secured at: 27 cm Tube secured with: ETT holder Dental Injury: Teeth and Oropharynx as per pre-operative assessment       (including critical care time)  Medications Ordered in ED Medications  fentaNYL 2522mg in NS 2521m(1087mml) infusion-PREMIX (300 mcg/hr Intravenous Rate/Dose Change 07/01/19 0128)  pantoprazole (PROTONIX) injection 40 mg (40 mg Intravenous Given 07/01/19 0022)  aspirin EC tablet 325 mg (has no administration in time range)  heparin injection 5,000 Units (5,000 Units Subcutaneous Given 07/01/19 0549)  0.9 %  sodium chloride infusion ( Intravenous New Bag/Given 1/25/8/3019407folic acid injection 1 mg (1 mg Intravenous Given 07/01/19 0935)  thiamine (B-1) injection 100 mg (100 mg Intravenous Given 07/01/19 0929)  midazolam (VERSED) injection 2 mg (2 mg Intravenous Given 07/01/19 1141)  nicotine (NICODERM CQ - dosed in mg/24 hours) patch 14 mg (14 mg Transdermal Patch Applied 07/01/19 0930)  Chlorhexidine Gluconate Cloth 2 % PADS 6 each (6 each Topical Given 07/01/19 0930)  chlorhexidine gluconate (MEDLINE KIT) (PERIDEX) 0.12 % solution 15 mL (15 mLs Mouth Rinse Given 07/01/19 0800)  MEDLINE mouth rinse (15 mLs Mouth Rinse Given 07/01/19 1200)  propofol (DIPRIVAN) 1000 MG/100ML infusion (20 mcg/kg/min  83.9 kg Intravenous Rate/Dose Change 07/01/19 0828)  0.9 %  sodium chloride infusion ( Intravenous Restarted 07/01/19 0300)  phenylephrine (NEOSYNEPHRINE) 10-0.9 MG/250ML-% infusion (130 mcg/min Intravenous New Bag/Given 07/01/19 1245)  etomidate (AMIDATE) injection (20 mg Intravenous Given 06/30/19 2204)  rocuronium (ZEMURON) injection (80 mg Intravenous Given 06/30/19 2204)  sodium chloride 0.9 % bolus 1,000 mL (0 mLs Intravenous Stopped 06/30/19 2348)  fentaNYL (SUBLIMAZE) injection 50 mcg (50 mcg Intravenous Given 06/30/19 2237)  0.9 %  sodium  chloride infusion ( Intravenous Stopped 06/30/19 2220)  potassium chloride 10 mEq in 100 mL IVPB (10 mEq Intravenous New Bag/Given 07/01/19 0254)  magnesium sulfate IVPB 2 g 50 mL (0 g Intravenous Stopped 07/01/19 0115)  calcium gluconate 2 g/ 100 mL sodium chloride IVPB (0 mg Intravenous Stopped 07/01/19 0119)  aspirin suppository 300 mg (300 mg Rectal Given 07/01/19 0027)  sodium chloride 0.9 % 1,000 mL with thiamine 100680, folic acid 1 mg, multivitamins adult 10 mL infusion ( Intravenous New Bag/Given 07/01/19 0116)  midazolam (VERSED) injection 2 mg (2 mg Intravenous Given 07/01/19 0238)  morphine 2 MG/ML injection 1 mg (1 mg Intravenous Given 07/01/19 1211)    ED Course  I have reviewed the triage vital signs and the nursing notes.  Pertinent labs & imaging results that were available during my care of the patient were reviewed by me and considered in my medical decision making (see chart for details).    MDM Rules/Calculators/A&P                      Pedro Gomez a [age]o. male with a medical history of atrial fibrillation who presents to the ED as a post arrest. He became unresponsive without a pulse while in the car an ago with his girlfriend, he was the passenger. She pulled over, started cpr and called 911. EMS and fire performed cpr for a total of 4 minutes, gave him  x1 epi and shocked him, achieved rosc. He remained with pulse but a gcs of 3 upon arrival. No further history obtained.  HPI and physical exam as above. He presents with supraglottic airway, bilateral breath sounds, hemodynamically stable. Pulses are palpable and thready. Exam significant for: Gcs 3, Unresponsive and obtunded, No response to pain Pupils 5m bilaterally and sluggish to react to light  Performed RSI, removed king airway and placed endotrachial tube without complication. Ekg is non ischemic, sinus rhythm with shortened pr interval, no delta wave. Cxr demonstrates tube in appropriate position. OG below diaphragm.  Otherwise unremarkable.   Undifferentiated cause of cardiac arrest. He has history of atrial fibrillation, suspect a cardiogenic arrhythmia for arrest. Per history, no prodromal symptoms of his condition. His ecg is non ischemic. We decided to start cooling for neuroprotection. Discussed case with icu who will admit the patient. He was started on fentanyl sedation for soft blood pressures and was transferred to the icu.   Final Clinical Impression(s) / ED Diagnoses Final diagnoses:  None    Rx / DC Orders ED Discharge Orders    None       Eric Nees, TLovena Le MD 07/01/19 1[place] MD 07/03/19 1151

## 2019-06-30 NOTE — ED Notes (Signed)
Family placed in consultation b at this time

## 2019-07-01 ENCOUNTER — Inpatient Hospital Stay (HOSPITAL_COMMUNITY): Payer: Self-pay

## 2019-07-01 ENCOUNTER — Inpatient Hospital Stay: Payer: Self-pay

## 2019-07-01 DIAGNOSIS — Z8679 Personal history of other diseases of the circulatory system: Secondary | ICD-10-CM | POA: Insufficient documentation

## 2019-07-01 DIAGNOSIS — F1410 Cocaine abuse, uncomplicated: Secondary | ICD-10-CM

## 2019-07-01 DIAGNOSIS — Q211 Atrial septal defect: Secondary | ICD-10-CM

## 2019-07-01 DIAGNOSIS — Q2112 Patent foramen ovale: Secondary | ICD-10-CM

## 2019-07-01 DIAGNOSIS — N179 Acute kidney failure, unspecified: Secondary | ICD-10-CM | POA: Insufficient documentation

## 2019-07-01 DIAGNOSIS — F1910 Other psychoactive substance abuse, uncomplicated: Secondary | ICD-10-CM

## 2019-07-01 DIAGNOSIS — I480 Paroxysmal atrial fibrillation: Secondary | ICD-10-CM

## 2019-07-01 DIAGNOSIS — I469 Cardiac arrest, cause unspecified: Secondary | ICD-10-CM

## 2019-07-01 DIAGNOSIS — F17200 Nicotine dependence, unspecified, uncomplicated: Secondary | ICD-10-CM

## 2019-07-01 DIAGNOSIS — G9340 Encephalopathy, unspecified: Secondary | ICD-10-CM

## 2019-07-01 LAB — BASIC METABOLIC PANEL
Anion gap: 8 (ref 5–15)
Anion gap: 4 — ABNORMAL LOW (ref 5–15)
BUN: 19 mg/dL (ref 6–20)
BUN: 18 mg/dL (ref 6–20)
CO2: 20 mmol/L — ABNORMAL LOW (ref 22–32)
CO2: 21 mmol/L — ABNORMAL LOW (ref 22–32)
Calcium: 8 mg/dL — ABNORMAL LOW (ref 8.9–10.3)
Calcium: 7.6 mg/dL — ABNORMAL LOW (ref 8.9–10.3)
Chloride: 114 mmol/L — ABNORMAL HIGH (ref 98–111)
Chloride: 114 mmol/L — ABNORMAL HIGH (ref 98–111)
Creatinine, Ser: 1.38 mg/dL — ABNORMAL HIGH (ref 0.61–1.24)
Creatinine, Ser: 1.07 mg/dL (ref 0.61–1.24)
GFR calc Af Amer: >60 mL/min (ref >60)
GFR calc Af Amer: >60 mL/min (ref >60)
GFR calc non Af Amer: >60 mL/min (ref >60)
GFR calc non Af Amer: >60 mL/min (ref >60)
Glucose, Bld: 89 mg/dL (ref 70–99)
Glucose, Bld: 84 mg/dL (ref 70–99)
Potassium: 4.8 mmol/L (ref 3.5–5.1)
Potassium: 4.5 mmol/L (ref 3.5–5.1)
Sodium: 142 mmol/L (ref 135–145)
Sodium: 139 mmol/L (ref 135–145)

## 2019-07-01 LAB — COMPREHENSIVE METABOLIC PANEL
ALT: 76 U/L — ABNORMAL HIGH (ref 0–44)
AST: 80 U/L — ABNORMAL HIGH (ref 15–41)
Albumin: 3.6 g/dL (ref 3.5–5.0)
Alkaline Phosphatase: 46 U/L (ref 38–126)
Anion gap: 12 (ref 5–15)
BUN: 20 mg/dL (ref 6–20)
CO2: 19 mmol/L — ABNORMAL LOW (ref 22–32)
Calcium: 8.3 mg/dL — ABNORMAL LOW (ref 8.9–10.3)
Chloride: 111 mmol/L (ref 98–111)
Creatinine, Ser: 1.38 mg/dL — ABNORMAL HIGH (ref 0.61–1.24)
GFR calc Af Amer: >60 mL/min (ref >60)
GFR calc non Af Amer: >60 mL/min (ref >60)
Glucose, Bld: 105 mg/dL — ABNORMAL HIGH (ref 70–99)
Potassium: 4.3 mmol/L (ref 3.5–5.1)
Sodium: 142 mmol/L (ref 135–145)
Total Bilirubin: 0.8 mg/dL (ref 0.3–1.2)
Total Protein: 5.7 g/dL — ABNORMAL LOW (ref 6.5–8.1)

## 2019-07-01 LAB — GLUCOSE, CAPILLARY
Glucose-Capillary: 140 mg/dL — ABNORMAL HIGH (ref 70–99)
Glucose-Capillary: 69 mg/dL — ABNORMAL LOW (ref 70–99)
Glucose-Capillary: 72 mg/dL (ref 70–99)
Glucose-Capillary: 73 mg/dL (ref 70–99)
Glucose-Capillary: 81 mg/dL (ref 70–99)
Glucose-Capillary: 82 mg/dL (ref 70–99)
Glucose-Capillary: 83 mg/dL (ref 70–99)
Glucose-Capillary: 95 mg/dL (ref 70–99)
Glucose-Capillary: 96 mg/dL (ref 70–99)

## 2019-07-01 LAB — PROTIME-INR
INR: 1.1 (ref 0.8–1.2)
Prothrombin Time: 14.2 seconds (ref 11.4–15.2)

## 2019-07-01 LAB — TSH: TSH: 1.281 u[IU]/mL (ref 0.350–4.500)

## 2019-07-01 LAB — CBC
HCT: 39.5 % (ref 39.0–52.0)
Hemoglobin: 12.8 g/dL — ABNORMAL LOW (ref 13.0–17.0)
MCH: 30.9 pg (ref 26.0–34.0)
MCHC: 32.4 g/dL (ref 30.0–36.0)
MCV: 95.4 fL (ref 80.0–100.0)
Platelets: 210 10*3/uL (ref 150–400)
RBC: 4.14 MIL/uL — ABNORMAL LOW (ref 4.22–5.81)
RDW: 12.3 % (ref 11.5–15.5)
WBC: 10 10*3/uL (ref 4.0–10.5)
nRBC: 0 % (ref 0.0–0.2)

## 2019-07-01 LAB — MRSA PCR SCREENING: MRSA by PCR: NEGATIVE

## 2019-07-01 LAB — TROPONIN I (HIGH SENSITIVITY)
Troponin I (High Sensitivity): 101 ng/L (ref <18)
Troponin I (High Sensitivity): 226 ng/L (ref <18)

## 2019-07-01 LAB — PHOSPHORUS: Phosphorus: 4.3 mg/dL (ref 2.5–4.6)

## 2019-07-01 LAB — LACTIC ACID, PLASMA: Lactic Acid, Venous: 4.4 mmol/L (ref 0.5–1.9)

## 2019-07-01 LAB — RAPID URINE DRUG SCREEN, HOSP PERFORMED
Amphetamines: NOT DETECTED
Barbiturates: NOT DETECTED
Benzodiazepines: NOT DETECTED
Cocaine: POSITIVE — AB
Opiates: NOT DETECTED
Tetrahydrocannabinol: POSITIVE — AB

## 2019-07-01 LAB — APTT
aPTT: 28 seconds (ref 24–36)
aPTT: 30 seconds (ref 24–36)

## 2019-07-01 LAB — ACETAMINOPHEN LEVEL: Acetaminophen (Tylenol), Serum: <10 ug/mL — ABNORMAL LOW (ref 10–30)

## 2019-07-01 LAB — HEPATITIS PANEL, ACUTE
HCV Ab: NONREACTIVE
Hep A IgM: NONREACTIVE
Hep B C IgM: NONREACTIVE
Hepatitis B Surface Ag: NONREACTIVE

## 2019-07-01 LAB — LIPID PANEL
Cholesterol: 141 mg/dL (ref 0–200)
HDL: 53 mg/dL (ref >40)
LDL Cholesterol: 81 mg/dL (ref 0–99)
Total CHOL/HDL Ratio: 2.7 RATIO
Triglycerides: 36 mg/dL (ref <150)
VLDL: 7 mg/dL (ref 0–40)

## 2019-07-01 LAB — MAGNESIUM: Magnesium: 2.2 mg/dL (ref 1.7–2.4)

## 2019-07-01 LAB — ETHANOL: Alcohol, Ethyl (B): <10 mg/dL (ref <10)

## 2019-07-01 LAB — HIV ANTIBODY (ROUTINE TESTING W REFLEX): HIV Screen 4th Generation wRfx: NONREACTIVE

## 2019-07-01 LAB — SALICYLATE LEVEL: Salicylate Lvl: <7 mg/dL — ABNORMAL LOW (ref 7.0–30.0)

## 2019-07-01 MED ORDER — PANTOPRAZOLE SODIUM 40 MG IV SOLR
40.0000 mg | INTRAVENOUS | Status: DC
  Administered 2019-07-01 – 2019-07-10 (×11): 40 mg via INTRAVENOUS
  Filled 2019-07-01 (×11): qty 40

## 2019-07-01 MED ORDER — ORAL CARE MOUTH RINSE
15.0000 mL | OROMUCOSAL | Status: DC
  Administered 2019-07-01 – 2019-07-08 (×72): 15 mL via OROMUCOSAL

## 2019-07-01 MED ORDER — ASPIRIN EC 325 MG PO TBEC
325.0000 mg | DELAYED_RELEASE_TABLET | Freq: Every day | ORAL | Status: DC
  Filled 2019-07-01: qty 4
  Filled 2019-07-01: qty 1

## 2019-07-01 MED ORDER — CHLORHEXIDINE GLUCONATE 0.12% ORAL RINSE (MEDLINE KIT)
15.0000 mL | Freq: Two times a day (BID) | OROMUCOSAL | Status: DC
  Administered 2019-07-01 – 2019-07-08 (×15): 15 mL via OROMUCOSAL

## 2019-07-01 MED ORDER — MIDAZOLAM HCL 2 MG/2ML IJ SOLN
INTRAMUSCULAR | Status: AC
  Filled 2019-07-01: qty 2

## 2019-07-01 MED ORDER — HEPARIN SODIUM (PORCINE) 5000 UNIT/ML IJ SOLN: 5000.0000 [IU] | Freq: Three times a day (TID) | INTRAMUSCULAR | Status: DC

## 2019-07-01 MED ORDER — MORPHINE SULFATE (PF) 2 MG/ML IV SOLN
1.0000 mg | Freq: Once | INTRAVENOUS | Status: AC
  Administered 2019-07-01: 1 mg via INTRAVENOUS
  Filled 2019-07-01: qty 1

## 2019-07-01 MED ORDER — SODIUM CHLORIDE 0.9% FLUSH: 10.0000 mL | INTRAVENOUS | Status: DC | PRN

## 2019-07-01 MED ORDER — PHENYLEPHRINE CONCENTRATED 100MG/250ML (0.4 MG/ML) INFUSION SIMPLE
0.0000 ug/min | INTRAVENOUS | Status: DC
  Administered 2019-07-01: 130 ug/min via INTRAVENOUS
  Filled 2019-07-01: qty 250

## 2019-07-01 MED ORDER — PHENYLEPHRINE HCL-NACL 10-0.9 MG/250ML-% IV SOLN
25.0000 ug/min | INTRAVENOUS | Status: DC
  Administered 2019-07-01 (×4): 130 ug/min via INTRAVENOUS
  Administered 2019-07-01: 25 ug/min via INTRAVENOUS
  Filled 2019-07-01 (×3): qty 250
  Filled 2019-07-01: qty 500
  Filled 2019-07-01 (×3): qty 250

## 2019-07-01 MED ORDER — NICOTINE 14 MG/24HR TD PT24
14.0000 mg | MEDICATED_PATCH | Freq: Every day | TRANSDERMAL | Status: DC
  Administered 2019-07-01 – 2019-07-14 (×13): 14 mg via TRANSDERMAL
  Filled 2019-07-01 (×14): qty 1

## 2019-07-01 MED ORDER — PROPOFOL 1000 MG/100ML IV EMUL
5.0000 ug/kg/min | INTRAVENOUS | Status: DC
  Administered 2019-07-01: 29.996 ug/kg/min via INTRAVENOUS
  Administered 2019-07-01: 10 ug/kg/min via INTRAVENOUS
  Administered 2019-07-01: 80 ug/kg/min via INTRAVENOUS
  Administered 2019-07-01: 5 ug/kg/min via INTRAVENOUS
  Administered 2019-07-02: 80 ug/kg/min via INTRAVENOUS
  Administered 2019-07-02: 40 ug/kg/min via INTRAVENOUS
  Administered 2019-07-02: 80 ug/kg/min via INTRAVENOUS
  Administered 2019-07-02: 60 ug/kg/min via INTRAVENOUS
  Filled 2019-07-01 (×3): qty 100
  Filled 2019-07-01: qty 200
  Filled 2019-07-01 (×3): qty 100

## 2019-07-01 MED ORDER — HEPARIN SODIUM (PORCINE) 5000 UNIT/ML IJ SOLN
5000.0000 [IU] | Freq: Three times a day (TID) | INTRAMUSCULAR | Status: DC
  Administered 2019-07-01 – 2019-07-06 (×16): 5000 [IU] via SUBCUTANEOUS
  Filled 2019-07-01 (×16): qty 1

## 2019-07-01 MED ORDER — LACTATED RINGERS IV BOLUS
1000.0000 mL | Freq: Once | INTRAVENOUS | Status: AC
  Administered 2019-07-01: 1000 mL via INTRAVENOUS

## 2019-07-01 MED ORDER — FOLIC ACID 5 MG/ML IJ SOLN
1.0000 mg | Freq: Every day | INTRAMUSCULAR | Status: DC
  Administered 2019-07-01 – 2019-07-11 (×11): 1 mg via INTRAVENOUS
  Filled 2019-07-01 (×12): qty 0.2

## 2019-07-01 MED ORDER — DEXTROSE 50 % IV SOLN: 12.5000 g | Freq: Once | INTRAVENOUS | Status: AC

## 2019-07-01 MED ORDER — SODIUM CHLORIDE 0.9 % IV SOLN: INTRAVENOUS | Status: DC

## 2019-07-01 MED ORDER — MIDAZOLAM HCL 2 MG/2ML IJ SOLN
2.0000 mg | INTRAMUSCULAR | Status: AC | PRN
  Administered 2019-07-01 (×3): 2 mg via INTRAVENOUS
  Filled 2019-07-01 (×3): qty 2

## 2019-07-01 MED ORDER — SODIUM CHLORIDE 0.9 % IV SOLN
250.0000 mL | INTRAVENOUS | Status: DC
  Administered 2019-07-08: 250 mL via INTRAVENOUS

## 2019-07-01 MED ORDER — DEXTROSE 50 % IV SOLN
INTRAVENOUS | Status: AC
  Administered 2019-07-01: 12.5 g via INTRAVENOUS
  Filled 2019-07-01: qty 50

## 2019-07-01 MED ORDER — SODIUM CHLORIDE 0.9% FLUSH
10.0000 mL | Freq: Two times a day (BID) | INTRAVENOUS | Status: DC
  Administered 2019-07-01 – 2019-07-07 (×10): 10 mL
  Administered 2019-07-07: 30 mL
  Administered 2019-07-08 – 2019-07-09 (×3): 10 mL
  Administered 2019-07-09: 40 mL
  Administered 2019-07-10: 10 mL
  Administered 2019-07-11: 09:00:00 30 mL
  Administered 2019-07-12 – 2019-07-14 (×4): 10 mL

## 2019-07-01 MED ORDER — THIAMINE HCL 100 MG/ML IJ SOLN
Freq: Once | INTRAVENOUS | Status: AC
  Filled 2019-07-01: qty 1000

## 2019-07-01 MED ORDER — THIAMINE HCL 100 MG/ML IJ SOLN
100.0000 mg | Freq: Every day | INTRAMUSCULAR | Status: DC
  Administered 2019-07-01 – 2019-07-09 (×9): 100 mg via INTRAVENOUS
  Filled 2019-07-01 (×9): qty 2

## 2019-07-01 MED ORDER — MIDAZOLAM HCL 2 MG/2ML IJ SOLN
2.0000 mg | INTRAMUSCULAR | Status: DC | PRN
  Administered 2019-07-01 – 2019-07-02 (×5): 2 mg via INTRAVENOUS
  Filled 2019-07-01 (×4): qty 2

## 2019-07-01 MED ORDER — PHENYLEPHRINE HCL-NACL 10-0.9 MG/250ML-% IV SOLN
INTRAVENOUS | Status: AC
  Filled 2019-07-01: qty 250

## 2019-07-01 MED ORDER — CHLORHEXIDINE GLUCONATE CLOTH 2 % EX PADS
6.0000 | MEDICATED_PAD | Freq: Every day | CUTANEOUS | Status: DC
  Administered 2019-07-01 – 2019-07-13 (×12): 6 via TOPICAL

## 2019-07-01 MED ORDER — ASPIRIN 300 MG RE SUPP
300.0000 mg | RECTAL | Status: AC
  Administered 2019-07-01: 300 mg via RECTAL
  Filled 2019-07-01: qty 1

## 2019-07-01 MED ORDER — NOREPINEPHRINE 4 MG/250ML-% IV SOLN
0.0000 ug/min | INTRAVENOUS | Status: DC
  Administered 2019-07-01: 2 ug/min via INTRAVENOUS
  Administered 2019-07-02: 6 ug/min via INTRAVENOUS
  Administered 2019-07-03: 4 ug/min via INTRAVENOUS
  Filled 2019-07-01 (×3): qty 250

## 2019-07-01 MED ORDER — NOREPINEPHRINE 4 MG/250ML-% IV SOLN: 0.0000 ug/min | INTRAVENOUS | Status: DC

## 2019-07-01 NOTE — Progress Notes (Signed)
Peripherally Inserted Central Catheter/Midline Placement  The IV Nurse has discussed with the patient and/or persons authorized to consent for the patient, the purpose of this procedure and the potential benefits and risks involved with this procedure.  The benefits include less needle sticks, lab draws from the catheter, and the patient may be discharged home with the catheter. Risks include, but not limited to, infection, bleeding, blood clot (thrombus formation), and puncture of an artery; nerve damage and irregular heartbeat and possibility to perform a PICC exchange if needed/ordered by physician.  Alternatives to this procedure were also discussed.  Bard Power PICC patient education guide, fact sheet on infection prevention and patient information card has been provided to patient /or left at bedside.  Mother signed phone consent  PICC/Midline Placement Documentation  PICC Triple Lumen 07/01/19 PICC Right Brachial 39 cm 0 cm (Active)  Indication for Insertion or Continuance of Line Vasoactive infusions 07/01/19 1612  Exposed Catheter (cm) 0 cm 07/01/19 1612  Site Assessment Clean;Dry;Intact 07/01/19 1612  Lumen #1 Status Flushed;Saline locked;Blood return noted 07/01/19 1612  Lumen #2 Status Flushed;Saline locked;Blood return noted 07/01/19 1612  Lumen #3 Status Flushed;Saline locked;Blood return noted 07/01/19 1612  Dressing Type Transparent 07/01/19 1612  Dressing Status Clean;Dry;Intact;Antimicrobial disc in place 07/01/19 1612  Dressing Change Due 07/08/19 07/01/19 1612       Ethelda Chick 07/01/2019, 4:13 PM

## 2019-07-01 NOTE — Progress Notes (Signed)
eLink Physician-Brief Progress Note Patient Name: Pedro Gomez DOB: Nov 01, 1980 MRN: [id number]   Date of Service  07/01/2019  HPI/Events of Note  Agitation - Shivering and biting on ETT.   eICU Interventions  Will order: 1. Propofol IV infusion. Titrate to RASS = 0 to -1.  2. Phenylephrine via Periferal IV. Titrate to MAP >= 65.       Intervention Category Major Interventions: Delirium, psychosis, severe agitation - evaluation and management  Ryver Zadrozny Eugene 07/01/2019, 2:52 AM

## 2019-07-01 NOTE — Progress Notes (Signed)
Initial Nutrition Assessment  DOCUMENTATION CODES:   Not applicable  INTERVENTION:   Recommend obtain height measurement.  If unable to extubate within the next 24 hours, recommend begin TF via OGT:   Vital AF 1.2 at 55 ml/h (1320 ml per day)   Pro-stat 30 ml BID   Provides 1784 kcal (2183 kcal total with current propofol rate), 129 gm protein, 1071 ml free water daily  NUTRITION DIAGNOSIS:   Inadequate oral intake related to inability to eat as evidenced by NPO status.  GOAL:   Patient will meet greater than or equal to 90% of their needs  MONITOR:   Vent status, Labs, I & O's  REASON FOR ASSESSMENT:   Ventilator    ASSESSMENT:   39 year old male presented with witnessed cardiac arrest. Admitted to Cardiac unit on normothermia protocol. PMH includes paroxysmal atrial fibrillation, polysubstance abuse.  RD working remotely.  UDS + cocaine and marijuana  Patient is currently intubated on ventilator support, currently requiring 1 pressor.  MV: 8 L/min Temp (24hrs), Avg:94.5 F (34.7 C), Min:93.1 F (33.9 C), Max:97.9 F (36.6 C)  Propofol: 15.1 ml/hr providing 399 kcal from lipid   Labs reviewed. Creatinine 1.38 (H) CBG's: 83-82  Medications reviewed and include folic acid, thiamine, neosynephrine, propofol.  NUTRITION - FOCUSED PHYSICAL EXAM:  unable to complete  Diet Order:   Diet Order    None      EDUCATION NEEDS:   Not appropriate for education at this time  Skin:  Skin Assessment: Reviewed RN Assessment  Last BM:  No BM documented  Height:   Ht Readings from Last 1 Encounters:  No data found for Ht    Weight:   Wt Readings from Last 1 Encounters:  07/01/19 85.5 kg    BMI:  There is no height or weight on file to calculate BMI.  Estimated Nutritional Needs:   Kcal:  2100  Protein:  125-140 gm  Fluid:  >/= 2 L    Joaquin Courts, RD, LDN, CNSC Pager [phone] After Hours Pager [phone]

## 2019-07-01 NOTE — Progress Notes (Signed)
Bair Hugger placed d/t mild shivering to face and chest

## 2019-07-01 NOTE — Progress Notes (Addendum)
Called E-link and discussed with Dr. Arsenio Loader pt's constant severe shivering and biting on ETT tube. Propofol and Phenylphrine ordered.

## 2019-07-01 NOTE — Consult Note (Signed)
Responded to [employer], family no longer present, staff will page again if further chaplain services needed.   Rev Donnel Saxon Chaplain

## 2019-07-01 NOTE — Progress Notes (Signed)
LTM EEG hooked up and running - no initial skin breakdown - push button tested - neuro notified. [facility] monitored 

## 2019-07-01 NOTE — Progress Notes (Signed)
Called by RN regarding shivering versus seizure activity from 10:30 PM to 11:00 PM. LTM EEG reviewed during this time period. Extensive muscle artifact is seen in the frontal-temporal-parietal leads with relative sparing of occipital leads bilaterally. No electrographic seizures are seen.   Electronically signed: Dr. Caryl Pina

## 2019-07-01 NOTE — Procedures (Signed)
ELECTROENCEPHALOGRAM REPORT   Patient: Pedro Gomez       Room #: 2H20C EEG No. ID: [id number] Age: 39 y.o.        Sex: male Referring Physician: Everardo All Report Date:  07/01/2019        Interpreting Physician: Thana Farr  History: [name] is an 39 y.o. male s/p arrest  Medications:  ASA, Thiamine, Fentanyl, Propofol, Neosynephrine  Conditions of Recording:  This is a 21 channel routine scalp EEG performed with bipolar and monopolar montages arranged in accordance to the international 10/20 system of electrode placement. One channel was dedicated to EKG recording.  The patient is in the intubated and sedated state.  Description:  Artifact is prominent during the recording often obscuring the background rhythm. When able to be visualized the background is discontinuous.  It consists of a fairly generalized distribution of a well organized, low voltage mixture of theta and alpha activity.  This activity is interrupted by intermittent periods of attenuation that are generalized as well and last from 2-5 seconds.  This discontinuous activity is persistent throughout the recording.   The patient is stimulated during the recording with no change in the background noted.   No epileptiform activity is noted. Hyperventilation and intermittent photic stimulation were not performed.   IMPRESSION: This electroencephalogram is characterized by a discontinuous rhythm that show no evidence of reactivity.  These findings may be seen in a variety of circumstances, including anesthesia, drug intoxication, hypothermia, as well as diffuse cerebral injury.  Clinical/neurological, and radiographic correlation advised.  No epileptiform activity is noted.     Thana Farr, MD Neurology [phone] 07/01/2019, 3:48 PM

## 2019-07-01 NOTE — Procedures (Signed)
Attempted bedside echo twice today but patient was getting EEG, then sterile procedure.

## 2019-07-01 NOTE — Progress Notes (Addendum)
PCCM Interval Note  Briefly, 39 year old male with paroxysmal atrial fibrillation and history of polysubstance abuse who presented with witnessed cardiac arrest. EMS shocked patient with ROSC. Admitted to Cardiac unit on normothermia protocol.  S: Admitted overnight with cooling blanket.  Blood pressure 116/71, pulse 74, temperature 97.9 F (36.6 C), temperature source Core, resp. rate 10, weight 85.5 kg, SpO2 100 %.  On my exam, young male on mechanical ventilation and cooling protocol. Shivering. Sedated and does not follow commands. Lungs clear to auscultation with no wheezing. Current NSR with no murmurs.  UDS + cocaine and marijuana  Assessment/Plan  Cardiac arrest Cardiogenic shock Unclear if Vfib/VT arrest Normothermia protocol On Neo gtt to maintain MAP >65. May need to switch to levophed. Will need CVC placement TTE pending Trend troponin ASA K >4 and Mg >2  Shivering  Morphine once  Acute encephalopathy secondary to cardiac arrest EEG Supportive care PAD protocol for mechanical ventilation: Propofol and fentanyl  Acute hypoxemic respiratory failure in setting of cardiac arrest Wean vent settings for goal SpO2 >88% No plan to extubate due to mental status  The patient is critically ill with multiple organ systems failure and requires high complexity decision making for assessment and support, frequent evaluation and titration of therapies, application of advanced monitoring technologies and extensive interpretation of multiple databases.   Additional Critical Care Time devoted to patient care services described in this note is 45 Minutes. This time reflects time of care of this signee Dr. Mechele Collin.   Mechele Collin, M.D. [facility] Pulmonary/Critical Care Medicine 07/01/2019 12:28 PM   Please see Amion for pager number to reach on-call Pulmonary and Critical Care Team.

## 2019-07-01 NOTE — Progress Notes (Signed)
Pt seems to be shivering.... Had Aloha Gell, RN take a look at the pt... Stated pt maybe seizing.... Called eLink and spoke w/Jodi Charity fundraiser.... Believes pt maybe shivering vs. Seizing but will page Neuro .... Maxed out on Fent to and Prop running at 70 mcg... will continue to monitor pt closely.

## 2019-07-01 NOTE — Progress Notes (Signed)
Rec'd call back from Neuro around 2345.... Reports he did not see any seizing activity... No new orders... will continue to monitor pt closely  HR 68 NSR; BP 118/84 (95); O2 100  Shivering has subsided after 2 mg of Versed given.

## 2019-07-01 NOTE — Progress Notes (Signed)
Mother Romey Mathieson) called for an update.... provided code word Letta Kocher)  Updated given.... mother verb understanding.

## 2019-07-01 NOTE — Progress Notes (Signed)
EEG complete - results pending 

## 2019-07-01 NOTE — H&P (Signed)
NAME:  Pedro Gomez, MRN:  [id number], DOB:  11/22/1980, LOS: 0 ADMISSION DATE:  06/30/2019, CONSULTATION DATE:  07/01/19 REFERRING MD: Dr. Elana Alm  , CHIEF COMPLAINT:  Cardiac Arrest   Brief History   39 y.o.M with PMH of atrial fibrillation with PFO without medical follow up who was in his usual state of health today running errands and watching football with his girlfriend.  They were in the car and she noticed he was unresponsive, pulled over and called 911 and attempted to start CPR.  EMS took over and pt defibrillated and given Epix1,  ROSC after total of 6 minutes CPR. ETT placed and PCCM consulted for admission.  History of present illness   Pedro Gomez is a 39 y.o. M with PMH of Atrial fibrillation and small PFO diagnosed in 2019 not on any medical therapy or anti-coagulation, alcohol, tobacco and marijuana use who was in his usual state of health until he collapsed in the car while his girlfriend was driving.  She reports that they had been doing errands today, he ate and watched football and had two alcoholic drinks, he was not complaining of anything.  They got in the car around 8pm and she noticed he was more quiet than normal, then he made a yawning noise and slumped over.  She pulled over and called 911 then pulled him out of the car and attempted to start CPR before EMS arrived, girlfriend estimates it was about 5 minutes from the time he collapsed to when EMS took over.  CPR was continued for 4 minutes with ROSC after defibrillation and Epix1.  On arrival to the ED pt was intubated and EKG showed sinus rhythm, CT head without acute findings, labs significant for negative troponin, cocaine and Tetrahydrocannabinol positive, K 2.9, CO2 17, creatinine 1.7 and AST/ALT 66.   He was started on cooling protocol and PCCM consulted for admission.   Past Medical History  Atrial Fibrillation, PFO, substance abuse  Significant Hospital Events   1/1 Presented to ED after ROSC and Admit to  PCCM  Consults:    Procedures:  1/1 ETT  Significant Diagnostic Tests:  1/1 CT head>>no acute findings   Micro Data:  1/1 Sars-CoV-2, influenza>>negative  1/1 BCx2>>negative    Antimicrobials:     Interim history/subjective:  Pt with poor neurologic exam after >1hr since RSI medications  Objective   Blood pressure 116/84, pulse 74, temperature (!) 93.8 F (34.3 C), resp. rate (!) 24, weight 83.9 kg, SpO2 100 %.    Vent Mode: PRVC FiO2 (%):  [40 %-100 %] 40 % Set Rate:  [18 bmp] 18 bmp Vt Set:  [650 mL] 650 mL PEEP:  [5 cmH20] 5 cmH20 Plateau Pressure:  [16 cmH20] 16 cmH20   Intake/Output Summary (Last 24 hours) at 07/01/2019 0035 Last data filed at 06/30/2019 2348 Gross per 24 hour  Intake 2000 ml  Output --  Net 2000 ml   Filed Weights   06/30/19 2158  Weight: 83.9 kg   General:  Well-nourished M, intubated and poorly responsive HEENT: MM pink/moist Neuro: unresponsive to pain, no gag or corneal reflex, pupils equal 74mm and sluggishly responsive to light CV: s1s2 bradycardic, rrr, no m/r/g PULM:  Intubated, CTAB GI: soft, bsx4 active  Extremities: warm/dry, no edema  Skin: no rashes or lesions   Resolved Hospital Problem list     Assessment & Plan:   Cardiac Arrest of unclear etiology, history of atrial fibrillation with PFO  -approximately 10-11 minutes down  time with ROSC after 6 minutes CPR -afib diagnosed 09/2018, prescribed Xarelto for 30 days, has not followed up with cardiology and is not taking any medications -No stroke on CT head, cocaine + P: -TTM to 36 degrees, sedation with Fentanyl, Versed -Trend troponin, start Asa as has multiple risk factors for CAD, obtain echocardiogram -In sinus rhythm currently, monitor for arrhythmia, check TSH -Check EEG, monitor neurologic recovery over the next 48 hrs, consider neurology consult --Maintain full vent support with SAT/SBT as tolerated -titrate Vent setting to maintain SpO2 greater than or equal  to 90%. -HOB elevated 30 degrees. -Plateau pressures less than 30 cm H20.  -Follow chest x-ray, ABG prn.   -Bronchial hygiene and RT/bronchodilator protocol.     Polysubstance abuse -cocaine and cannabinoid positive with ETOH level pending P: -IV thiamine and Folate, Versed prn and monitor for signs of withdrawal    AGMA -likely secondary to lactic acidosis of 6.0, pH 7.3 P: -gentle hydration and follow lactic and BMP's     Elevated Transaminases  -mild, possibly secondary to ETOH vs shock liver -Coags ok P: -repeat in the AM, check tylenol level -Consider hepatitis panel if worsening   Acute Kidney Injury, hypokalemia and hypocalcemia -Pt has duplicate chart in Epic, creatinine <1.0 last checked 6 months ago P: -Gently hydration and follow BMP's -monitor UOP -Replace K, mag and calcium overnight and repeat labs in the AM, replace as needed     Best practice:  Diet: npo Pain/Anxiety/Delirium protocol (if indicated): fentanyl, versed VAP protocol (if indicated): yes DVT prophylaxis: heparin GI prophylaxis: protonix Glucose control: ssi Mobility: bed rest Code Status: full Family Communication: spoke with girlfriend and mother in the ED, girlfriend will be visiting Disposition: ICU  Labs   CBC: Recent Labs  Lab 06/30/19 2150 06/30/19 2335  WBC 9.3  --   NEUTROABS 3.7  --   HGB 13.8 13.3  HCT 43.1 39.0  MCV 96.6  --   PLT 264  --     Basic Metabolic Panel: Recent Labs  Lab 06/30/19 2150 06/30/19 2335  NA 141 143  K 2.9* 3.4*  CL 108  --   CO2 17*  --   GLUCOSE 235*  --   BUN 20  --   CREATININE 1.67*  --   CALCIUM 8.3*  --    GFR: CrCl cannot be calculated (Unknown ideal weight.). Recent Labs  Lab 06/30/19 2150  WBC 9.3  LATICACIDVEN 6.0*    Liver Function Tests: Recent Labs  Lab 06/30/19 2150  AST 66*  ALT 66*  ALKPHOS 50  BILITOT 0.7  PROT 5.9*  ALBUMIN 3.6   No results for input(s): LIPASE, AMYLASE in the last 168  hours. No results for input(s): AMMONIA in the last 168 hours.  ABG    Component Value Date/Time   PHART 7.368 06/30/2019 2335   PCO2ART 34.1 06/30/2019 2335   PO2ART 430.0 (H) 06/30/2019 2335   HCO3 20.2 06/30/2019 2335   TCO2 21 (L) 06/30/2019 2335   ACIDBASEDEF 5.0 (H) 06/30/2019 2335   O2SAT 100.0 06/30/2019 2335     Coagulation Profile: Recent Labs  Lab 06/30/19 2150  INR 1.1    Cardiac Enzymes: No results for input(s): CKTOTAL, CKMB, CKMBINDEX, TROPONINI in the last 168 hours.  HbA1C: No results found for: HGBA1C  CBG: Recent Labs  Lab 06/30/19 2156  GLUCAP 219*    Review of Systems:   Unable to obtain 2/2 mental status  Past Medical History  He,  has no past medical history on file.   Surgical History   History reviewed. No pertinent surgical history.   Social History   has an unknown smoking status. He has never used smokeless tobacco. He reports current drug use. Drug: Marijuana.   Family History   His family history is not on file.   Allergies No Known Allergies   Home Medications  Prior to Admission medications   Not on File     Critical care time: 65 minutes    CRITICAL CARE Performed by: Darcella Gasman Chevy Sweigert   Total critical care time: 65 minutes  Critical care time was exclusive of separately billable procedures and treating other patients.  Critical care was necessary to treat or prevent imminent or life-threatening deterioration.  Critical care was time spent personally by me on the following activities: development of treatment plan with patient and/or surrogate as well as nursing, discussions with consultants, evaluation of patient's response to treatment, examination of patient, obtaining history from patient or surrogate, ordering and performing treatments and interventions, ordering and review of laboratory studies, ordering and review of radiographic studies, pulse oximetry and re-evaluation of patient's condition.  Darcella Gasman  Lory Nowaczyk, PA-C  PCCM  Pager# [phone], if no answer [phone]

## 2019-07-01 NOTE — ED Notes (Signed)
Ice removed from bilateral axilla and groin.

## 2019-07-02 ENCOUNTER — Inpatient Hospital Stay (HOSPITAL_COMMUNITY): Payer: Self-pay

## 2019-07-02 DIAGNOSIS — I469 Cardiac arrest, cause unspecified: Secondary | ICD-10-CM

## 2019-07-02 LAB — TROPONIN I (HIGH SENSITIVITY): Troponin I (High Sensitivity): 29 ng/L — ABNORMAL HIGH (ref <18)

## 2019-07-02 LAB — URINE CULTURE: Culture: NO GROWTH

## 2019-07-02 LAB — GLUCOSE, CAPILLARY
Glucose-Capillary: 57 mg/dL — ABNORMAL LOW (ref 70–99)
Glucose-Capillary: 66 mg/dL — ABNORMAL LOW (ref 70–99)
Glucose-Capillary: 70 mg/dL (ref 70–99)
Glucose-Capillary: 71 mg/dL (ref 70–99)
Glucose-Capillary: 74 mg/dL (ref 70–99)
Glucose-Capillary: 79 mg/dL (ref 70–99)
Glucose-Capillary: 83 mg/dL (ref 70–99)
Glucose-Capillary: 83 mg/dL (ref 70–99)
Glucose-Capillary: 94 mg/dL (ref 70–99)

## 2019-07-02 LAB — BASIC METABOLIC PANEL
Anion gap: 9 (ref 5–15)
BUN: 16 mg/dL (ref 6–20)
CO2: 21 mmol/L — ABNORMAL LOW (ref 22–32)
Calcium: 8.1 mg/dL — ABNORMAL LOW (ref 8.9–10.3)
Chloride: 111 mmol/L (ref 98–111)
Creatinine, Ser: 1 mg/dL (ref 0.61–1.24)
GFR calc Af Amer: >60 mL/min (ref >60)
GFR calc non Af Amer: >60 mL/min (ref >60)
Glucose, Bld: 83 mg/dL (ref 70–99)
Potassium: 4.2 mmol/L (ref 3.5–5.1)
Sodium: 141 mmol/L (ref 135–145)

## 2019-07-02 LAB — CBC
HCT: 36.5 % — ABNORMAL LOW (ref 39.0–52.0)
Hemoglobin: 11.9 g/dL — ABNORMAL LOW (ref 13.0–17.0)
MCH: 31.1 pg (ref 26.0–34.0)
MCHC: 32.6 g/dL (ref 30.0–36.0)
MCV: 95.3 fL (ref 80.0–100.0)
Platelets: 187 10*3/uL (ref 150–400)
RBC: 3.83 MIL/uL — ABNORMAL LOW (ref 4.22–5.81)
RDW: 12.6 % (ref 11.5–15.5)
WBC: 10.3 10*3/uL (ref 4.0–10.5)
nRBC: 0 % (ref 0.0–0.2)

## 2019-07-02 LAB — ECHOCARDIOGRAM COMPLETE: Weight: 3150.29 oz

## 2019-07-02 LAB — MAGNESIUM
Magnesium: 1.7 mg/dL (ref 1.7–2.4)
Magnesium: 1.6 mg/dL — ABNORMAL LOW (ref 1.7–2.4)
Magnesium: 2 mg/dL (ref 1.7–2.4)

## 2019-07-02 LAB — PHOSPHORUS
Phosphorus: 3.1 mg/dL (ref 2.5–4.6)
Phosphorus: 3.3 mg/dL (ref 2.5–4.6)

## 2019-07-02 MED ORDER — MAGNESIUM SULFATE 2 GM/50ML IV SOLN
2.0000 g | Freq: Once | INTRAVENOUS | Status: AC
  Administered 2019-07-02: 2 g via INTRAVENOUS
  Filled 2019-07-02: qty 50

## 2019-07-02 MED ORDER — PRO-STAT SUGAR FREE PO LIQD
30.0000 mL | Freq: Two times a day (BID) | ORAL | Status: DC
  Administered 2019-07-02 – 2019-07-03 (×3): 30 mL
  Filled 2019-07-02 (×3): qty 30

## 2019-07-02 MED ORDER — ASPIRIN 325 MG PO TABS
325.0000 mg | ORAL_TABLET | Freq: Every day | ORAL | Status: DC
  Administered 2019-07-02 – 2019-07-06 (×5): 325 mg
  Filled 2019-07-02 (×5): qty 1

## 2019-07-02 MED ORDER — DEXTROSE 50 % IV SOLN: 25.0000 mL | Freq: Once | INTRAVENOUS | Status: AC

## 2019-07-02 MED ORDER — VITAL HIGH PROTEIN PO LIQD
1000.0000 mL | ORAL | Status: DC
  Administered 2019-07-02: 1000 mL

## 2019-07-02 MED ORDER — DEXTROSE 50 % IV SOLN
INTRAVENOUS | Status: AC
  Administered 2019-07-02: 13:00:00 50 mL
  Filled 2019-07-02: qty 50

## 2019-07-02 MED ORDER — DEXTROSE 50 % IV SOLN
INTRAVENOUS | Status: AC
  Filled 2019-07-02: qty 50

## 2019-07-02 MED ORDER — LACTATED RINGERS BOLUS PEDS
1000.0000 mL | Freq: Once | INTRAVENOUS | Status: AC
  Administered 2019-07-02: 1000 mL via INTRAVENOUS

## 2019-07-02 NOTE — Progress Notes (Signed)
Found CVP set up with blood in the line and would not flush.... Notified day shift RN.... Placed IV team consult  Also, placed BAIR Hugger .Marland Kitchen.. pt found to be shivering at shift change.

## 2019-07-02 NOTE — Progress Notes (Signed)
Notified pharmacy that pt has begun to re-warm to 37 at 0200.

## 2019-07-02 NOTE — Procedures (Addendum)
Patient Name: Pedro Gomez  MRN: [id number]  Epilepsy Attending: Charlsie Quest  Referring Physician/Provider: Emilie Rutter, PA Duration: 07/01/2019 to 1512 to 07/02/2019 1512  Patient history: 38yo M s/p cardiac arrest on TTM. EEG to evaluate for seizure  Level of alertness: comatose  AEDs during EEG study: Propofol  Technical aspects: This EEG study was done with scalp electrodes positioned according to the 10-20 International system of electrode placement. Electrical activity was acquired at a sampling rate of 500Hz  and reviewed with a high frequency filter of 70Hz  and a low frequency filter of 1Hz . EEG data were recorded continuously and digitally stored.   DESCRIPTION: EEG showed generalized 7.5-8.5hz  alpha activity as well as intermittent 1-3 seconds of generalized attenuation. EEG was not reactive to tactile stimulation. Hyperventilation and photic stimulation were not performed.  Event button was pressed on 07/01/2019 at 2229 for unclear reasons. Concomitant eeg showed generalized myogenic artifact.    ABNORMALITY - Alpha activity, generalized - Background attenuation, generalized  IMPRESSION: This study is suggestive of profound diffuse encephalopathy, non specific to etiology but could be seen due to sedation, hypoxic-anoxic injury. No seizures or epileptiform discharges were seen throughout the recording.  Event button was pressed on 06/30/2018 at 2229 for unclear reasons. Concomitant eeg didn't show any change to suggest seizure and was likely due to shivering.

## 2019-07-02 NOTE — Progress Notes (Addendum)
PULMONARY / CRITICAL CARE MEDICINE   Name: Pedro Gomez MRN: [id number] DOB: 17-Aug-1980    ADMISSION DATE:  06/30/2019 CONSULTATION DATE:  06/30/19  CHIEF COMPLAINT:  shock  HISTORY OF PRESENT ILLNESS:   39 yo gentleman with history of afib, polysubstance use with witnessed cardiac arrest w/bystander followed by EMS CPR and vfib/vtach s/p defibrillation in the field.    PAST MEDICAL HISTORY :  He  has no past medical history on file.  PAST SURGICAL HISTORY: He  has no past surgical history on file.  No Known Allergies  No current facility-administered medications on file prior to encounter.   Current Outpatient Medications on File Prior to Encounter  Medication Sig  . famotidine (PEPCID) 10 MG tablet Take 10 mg by mouth 2 (two) times daily as needed for heartburn or indigestion.    FAMILY HISTORY:  His has no family status information on file.    SOCIAL HISTORY: He  has an unknown smoking status. He has never used smokeless tobacco. He reports current drug use. Drug: Marijuana.  REVIEW OF SYSTEMS:   Intubated, sedated  SUBJECTIVE:  Overnight into this morning pt phenylephrine weaned off, sedation weaning started  VITAL SIGNS: BP 99/64   Pulse 77   Temp 98.2 F (36.8 C) (Core)   Resp 12   Wt 89.3 kg Comment: Subtracted 2.39 kg d/t pads w/water  SpO2 100%   HEMODYNAMICS:    VENTILATOR SETTINGS: Vent Mode: PRVC FiO2 (%):  [40 %] 40 % Set Rate:  [12 bmp] 12 bmp Vt Set:  [650 mL] 650 mL PEEP:  [5 cmH20] 5 cmH20 Plateau Pressure:  [11 cmH20-20 cmH20] 20 cmH20  INTAKE / OUTPUT: I/O last 3 completed shifts: In: 7269 [I.V.:5868.6; IV Piggyback:1400.4] Out: 1700 [Urine:1250; Emesis/NG output:450]  PHYSICAL EXAMINATION: General:  Well nourished AAM Neuro:  Intubated and sedated HEENT:  ET tube in place Cardiovascular:  RRR, No MRG, no LE edema Lungs:  CTAB Abdomen:  Non distended, normal bs Skin:  No rashes or bruising noted  LABS:  BMET Recent Labs   Lab 07/01/19 0755 07/01/19 2256 07/02/19 0413  NA 142 139 141  K 4.8 4.5 4.2  CL 114* 114* 111  CO2 20* 21* 21*  BUN 19 18 16   CREATININE 1.38* 1.07 1.00  GLUCOSE 89 84 83    Electrolytes Recent Labs  Lab 07/01/19 0248 07/01/19 0755 07/01/19 2256 07/02/19 0413 07/02/19 1245  CALCIUM 8.3* 8.0* 7.6* 8.1*  --   MG 2.2  --   --  1.7 1.6*  PHOS 4.3  --   --   --  3.1    CBC Recent Labs  Lab 06/30/19 2150 06/30/19 2335 07/01/19 0248 07/02/19 0413  WBC 9.3  --  10.0 10.3  HGB 13.8 13.3 12.8* 11.9*  HCT 43.1 39.0 39.5 36.5*  PLT 264  --  210 187    Coag's Recent Labs  Lab 06/30/19 2150 07/01/19 0755  APTT 28 30  INR 1.1 1.1    Sepsis Markers Recent Labs  Lab 06/30/19 2150 07/01/19 0248  LATICACIDVEN 6.0* 4.4*    ABG Recent Labs  Lab 06/30/19 2335  PHART 7.368  PCO2ART 34.1  PO2ART 430.0*    Liver Enzymes Recent Labs  Lab 06/30/19 2150 07/01/19 0248  AST 66* 80*  ALT 66* 76*  ALKPHOS 50 46  BILITOT 0.7 0.8  ALBUMIN 3.6 3.6    Cardiac Enzymes No results for input(s): TROPONINI, PROBNP in the last 168 hours.  Glucose Recent  Labs  Lab 07/01/19 2330 07/02/19 0114 07/02/19 0416 07/02/19 0925 07/02/19 1018 07/02/19 1208  GLUCAP 73 71 74 66* 70 57*    Imaging EEG adult  Result Date: 07/01/2019 Thana Farr, MD     07/01/2019  4:02 PM ELECTROENCEPHALOGRAM REPORT Patient: Pedro Gomez       Room #: 2H20C EEG No. ID: [id number] Age: 39 y.o.        Sex: male Referring Physician: Everardo All Report Date:  07/01/2019       Interpreting Physician: Thana Farr History: Pedro Gomez is an 39 y.o. male s/p arrest Medications: ASA, Thiamine, Fentanyl, Propofol, Neosynephrine Conditions of Recording:  This is a 21 channel routine scalp EEG performed with bipolar and monopolar montages arranged in accordance to the international 10/20 system of electrode placement. One channel was dedicated to EKG recording. The patient is in the intubated and  sedated state. Description:  Artifact is prominent during the recording often obscuring the background rhythm. When able to be visualized the background is discontinuous.  It consists of a fairly generalized distribution of a well organized, low voltage mixture of theta and alpha activity.  This activity is interrupted by intermittent periods of attenuation that are generalized as well and last from 2-5 seconds.  This discontinuous activity is persistent throughout the recording.  The patient is stimulated during the recording with no change in the background noted.  No epileptiform activity is noted. Hyperventilation and intermittent photic stimulation were not performed. IMPRESSION: This electroencephalogram is characterized by a discontinuous rhythm that show no evidence of reactivity.  These findings may be seen in a variety of circumstances, including anesthesia, drug intoxication, hypothermia, as well as diffuse cerebral injury.  Clinical/neurological, and radiographic correlation advised.  No epileptiform activity is noted.  Thana Farr, MD Neurology [id number] 07/01/2019, 3:48 PM   Overnight EEG with video  Result Date: 07/02/2019 Charlsie Quest, MD     07/02/2019  9:49 AM Patient Name: Pedro Gomez MRN: [id number] Epilepsy Attending: Charlsie Quest Referring Physician/Provider: Emilie Rutter, PA Duration: 07/01/2019 to 1521 to 07/02/2019 0930 Patient history: 38yo M s/p cardiac arrest on TTM. EEG to evaluate for seizure Level of alertness: comatose AEDs during EEG study: Propofol Technical aspects: This EEG study was done with scalp electrodes positioned according to the 10-20 International system of electrode placement. Electrical activity was acquired at a sampling rate of 500Hz  and reviewed with a high frequency filter of 70Hz  and a low frequency filter of 1Hz . EEG data were recorded continuously and digitally stored. DESCRIPTION: EEG showed generalized 7.5-8.5hz  alpha activity as well as  intermittent 1-3 seconds of generalized attenuation. EEG was not reactive to tactile stimulation. Hyperventilation and photic stimulation were not performed. Event button was pressed on 07/01/2019 at 2229 for unclear reasons. Concomitant eeg showed generalized myogenic artifact.  ABNORMALITY - Alpha activity, generalized - Background attenuation, generalized IMPRESSION: This study is suggestive of profound diffuse encephalopathy, non specific to etiology but could be seen due to sedation, hypoxic-anoxic injury. No seizures or epileptiform discharges were seen throughout the recording. Event button was pressed on 06/30/2018 at 2229 for unclear reasons. Concomitant eeg didn't show any change to suggest seizure and was likely due to shivering.   ECHOCARDIOGRAM COMPLETE  Result Date: 07/02/2019   ECHOCARDIOGRAM REPORT   Patient Name:   Pedro Gomez Date of Exam: 07/02/2019 Medical Rec #:  08/30/2019        Height: Accession #:    Pedro Gomez  Weight:       196.9 lb Date of Birth:  1980-12-27        BSA:          2.00 m Patient Age:    38 years         BP:           98/65 mmHg Patient Gender: M                HR:           79 bpm. Exam Location:  Inpatient Procedure: 2D Echo Indications:    cardiac arrest  History:        Patient has no prior history of Echocardiogram examinations.                 Patent foramen ovale; Risk Factors:Current Smoker and                 polysubstance abuse.  Sonographer:    Johny Chess Referring Phys: [id number] [place]  1. Left ventricular ejection fraction, by visual estimation, is 50 to 55%. The left ventricle has low normal function. There is mildly increased left ventricular hypertrophy.  2. The left ventricle has no regional wall motion abnormalities.  3. Left atrial size was normal.  4. Right atrial size was normal.  5. Trivial pericardial effusion is present.  6. The pericardial effusion is posterior to the left ventricle.  7. The mitral valve is grossly  normal. No evidence of mitral valve regurgitation.  8. The tricuspid valve is grossly normal.  9. The aortic valve is tricuspid. Aortic valve regurgitation is not visualized. 10. The pulmonic valve was grossly normal. Pulmonic valve regurgitation is not visualized. 11. Global right ventricle has normal systolic function.The right ventricular size is normal. No increase in right ventricular wall thickness. 12. TR signal is inadequate for assessing pulmonary artery systolic pressure. 13. Intermittently seen bubbles in right heart, presumably from intavenous infusion. FINDINGS  Left Ventricle: Left ventricular ejection fraction, by visual estimation, is 50 to 55%. The left ventricle has low normal function. The left ventricle has no regional wall motion abnormalities. The left ventricular internal cavity size was the left ventricle is normal in size. There is mildly increased left ventricular hypertrophy. Left ventricular diastolic parameters were normal. Right Ventricle: The right ventricular size is normal. No increase in right ventricular wall thickness. Global RV systolic function is has normal systolic function. Left Atrium: Left atrial size was normal in size. Right Atrium: Right atrial size was normal in size Pericardium: Trivial pericardial effusion is present. The pericardial effusion is posterior to the left ventricle. Mitral Valve: The mitral valve is grossly normal. No evidence of mitral valve regurgitation. Tricuspid Valve: The tricuspid valve is grossly normal. Tricuspid valve regurgitation is trivial. Aortic Valve: The aortic valve is tricuspid. Aortic valve regurgitation is not visualized. Pulmonic Valve: The pulmonic valve was grossly normal. Pulmonic valve regurgitation is not visualized. Pulmonic regurgitation is not visualized. Aorta: The aortic root is normal in size and structure. IAS/Shunts: No atrial level shunt detected by color flow Doppler.  LEFT VENTRICLE PLAX 2D LVIDd:         4.60 cm   Diastology LVIDs:         3.40 cm  LV e' lateral:   10.80 cm/s LV PW:         1.20 cm  LV E/e' lateral: 7.9 LV IVS:        1.10 cm  LV  e' medial:    11.50 cm/s LVOT diam:     2.20 cm  LV E/e' medial:  7.4 LV SV:         50 ml LVOT Area:     3.80 cm  RIGHT VENTRICLE RV S prime:     14.50 cm/s TAPSE (M-mode): 1.7 cm LEFT ATRIUM           Index       RIGHT ATRIUM           Index LA diam:      2.80 cm 1.40 cm/m  RA Area:     15.10 cm LA Vol (A4C): 36.9 ml 18.47 ml/m RA Volume:   42.60 ml  21.32 ml/m  AORTIC VALVE LVOT Vmax:   67.20 cm/s LVOT Vmean:  48.000 cm/s LVOT VTI:    0.130 m  AORTA Ao Root diam: 3.00 cm MITRAL VALVE MV Area (PHT): 4.39 cm             SHUNTS MV PHT:        50.17 msec           Systemic VTI:  0.13 m MV Decel Time: 173 msec             Systemic Diam: 2.20 cm MV E velocity: 85.30 cm/s 103 cm/s MV A velocity: 59.10 cm/s 70.3 cm/s MV E/A ratio:  1.44       1.5  Nona Dell MD Electronically signed by Nona Dell MD Signature Date/Time: 07/02/2019/12:47:09 PM    Final      CULTURES: 06/30/19 Urine: NGTD Respiratory: NGTD  ANTIBIOTICS: none  ASSESSMENT / PLAN:  NEUROLOGIC A: Encephalopathy: 2/2 poor cerebral perfusion from cardiac arrest.  He is intubated and sedated after cardiac arrest, and on targeted temperature management.   No seizure activity seen on EEG  P:   -Try and wean sedation today, continue to assess  PULMONARY A: Acute respiratory failure: intubated during cardiac arrest, no infectious pneumonic or parapnuemonic abnormalities.  On mimimal vent assistance PRVC protocol  P:   -Full vent support, VAP ppx, wean sedation SBT when able  INFECTIOUS A:   No infectious etiology identified or suspected P:   -continue to follow cultures  CARDIOVASCULAR A:  Cardiac arrest: likely cardiogenic shock, may have had some stunning from cocaine vs prolonged arrhythmia, ECHO delayed and shows recovery of EF, no RWMA seen consistent with his now minimally elevated  troponin.   P:  -close electrolyte monitoring and replacement -support with IVF bolus LR 1L -wean off vasopressors  -continuous cardiac monitoring  HEMATOLOGIC A:   Mild normocytic anemia: likely dilutional  P:  -continue to monitor  GASTROINTESTINAL A:   Nutrition: currently intubated and sedated P:   -place NG tube and activate tube feed protocol -continue protonix  FAMILY  - Updates: Calling and updating family today  - Inter-disciplinary family meet or Palliative Care meeting due by:  day 7  Pulmonary and Critical Care Medicine [facility] Pager: [phone] Thornell Mule MD PGY-3 Internal Medicine Pager # [phone]   07/02/2019, 2:22 PM

## 2019-07-02 NOTE — Progress Notes (Signed)
LTM maint complete - no skin breakdown under:  FP1, M1, P3, and O1

## 2019-07-02 NOTE — Progress Notes (Signed)
Patient blood sugar 66, 12.5 ml of 50% dextrose pushed per protocol.

## 2019-07-02 NOTE — Progress Notes (Signed)
At 1400 noticed that CVP was backing up with blood almost to the pressure bag. Transducer would not flush and CVP line backed up with blood. Called RT in to look at CVP setup, RT indicated that they would place a new setup ASAP. RT never returned to replace line.

## 2019-07-02 NOTE — Progress Notes (Signed)
  Echocardiogram 2D Echocardiogram has been performed.  Delcie Roch 07/02/2019, 11:14 AM

## 2019-07-03 ENCOUNTER — Inpatient Hospital Stay (HOSPITAL_COMMUNITY): Payer: Self-pay

## 2019-07-03 ENCOUNTER — Other Ambulatory Visit (HOSPITAL_COMMUNITY): Payer: Self-pay

## 2019-07-03 LAB — CBC
HCT: 40.1 % (ref 39.0–52.0)
Hemoglobin: 12.9 g/dL — ABNORMAL LOW (ref 13.0–17.0)
MCH: 31.1 pg (ref 26.0–34.0)
MCHC: 32.2 g/dL (ref 30.0–36.0)
MCV: 96.6 fL (ref 80.0–100.0)
Platelets: 112 10*3/uL — ABNORMAL LOW (ref 150–400)
RBC: 4.15 MIL/uL — ABNORMAL LOW (ref 4.22–5.81)
RDW: 12.4 % (ref 11.5–15.5)
WBC: 14.1 10*3/uL — ABNORMAL HIGH (ref 4.0–10.5)
nRBC: 0 % (ref 0.0–0.2)

## 2019-07-03 LAB — BASIC METABOLIC PANEL
Anion gap: 8 (ref 5–15)
BUN: 11 mg/dL (ref 6–20)
CO2: 23 mmol/L (ref 22–32)
Calcium: 8.4 mg/dL — ABNORMAL LOW (ref 8.9–10.3)
Chloride: 107 mmol/L (ref 98–111)
Creatinine, Ser: 0.9 mg/dL (ref 0.61–1.24)
GFR calc Af Amer: >60 mL/min (ref >60)
GFR calc non Af Amer: >60 mL/min (ref >60)
Glucose, Bld: 79 mg/dL (ref 70–99)
Potassium: 4.8 mmol/L (ref 3.5–5.1)
Sodium: 138 mmol/L (ref 135–145)

## 2019-07-03 LAB — HEPATIC FUNCTION PANEL
ALT: 41 U/L (ref 0–44)
AST: 41 U/L (ref 15–41)
Albumin: 2.9 g/dL — ABNORMAL LOW (ref 3.5–5.0)
Alkaline Phosphatase: 63 U/L (ref 38–126)
Bilirubin, Direct: 0.1 mg/dL (ref 0.0–0.2)
Indirect Bilirubin: 0.7 mg/dL (ref 0.3–0.9)
Total Bilirubin: 0.8 mg/dL (ref 0.3–1.2)
Total Protein: 5.5 g/dL — ABNORMAL LOW (ref 6.5–8.1)

## 2019-07-03 LAB — GLUCOSE, CAPILLARY
Glucose-Capillary: 89 mg/dL (ref 70–99)
Glucose-Capillary: 91 mg/dL (ref 70–99)
Glucose-Capillary: 97 mg/dL (ref 70–99)
Glucose-Capillary: 115 mg/dL — ABNORMAL HIGH (ref 70–99)
Glucose-Capillary: 93 mg/dL (ref 70–99)

## 2019-07-03 LAB — MAGNESIUM
Magnesium: 1.7 mg/dL (ref 1.7–2.4)
Magnesium: 1.8 mg/dL (ref 1.7–2.4)

## 2019-07-03 LAB — PHOSPHORUS
Phosphorus: 3.6 mg/dL (ref 2.5–4.6)
Phosphorus: 2.8 mg/dL (ref 2.5–4.6)

## 2019-07-03 LAB — PROCALCITONIN: Procalcitonin: 2.67 ng/mL

## 2019-07-03 IMAGING — DX DG ABD PORTABLE 1V
1 series · 1 of 1 positions shown · non-contrast
Comparison: None.

CLINICAL DATA: Nausea and vomiting.

EXAM:
PORTABLE ABDOMEN - 1 VIEW

[abdomen kub]
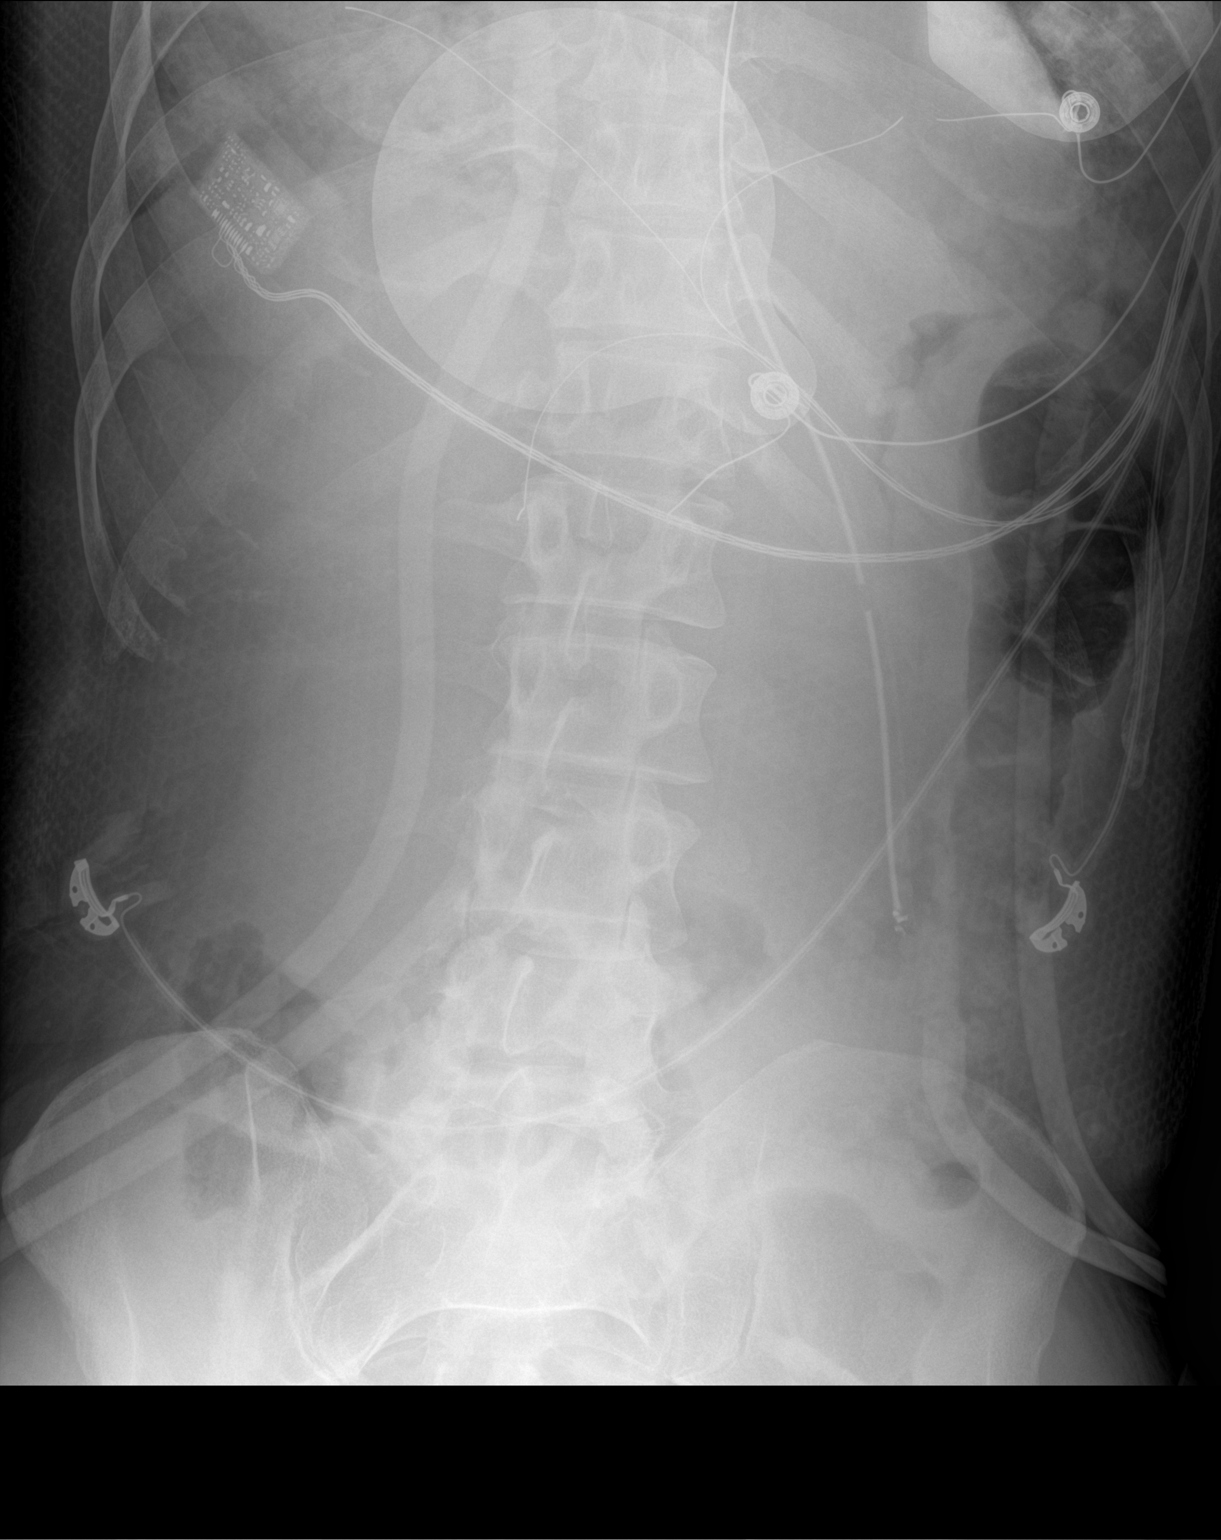

[1 of 1 positions shown; findings below may reference images not displayed]

FINDINGS: NG tube is in place in good position with both the tip and side-port
in the stomach. Bowel gas pattern is benign. No abnormal abdominal
calcification focal bony abnormality.
IMPRESSION: No acute finding.

NG tube in good position.

## 2019-07-03 IMAGING — DX DG CHEST 1V PORT
1 series · 1 of 1 positions shown · non-contrast
Comparison: Chest radiograph [DATE]

CLINICAL DATA: Reason for

EXAM:
PORTABLE CHEST 1 VIEW
Aspiration into airway.

[chest]
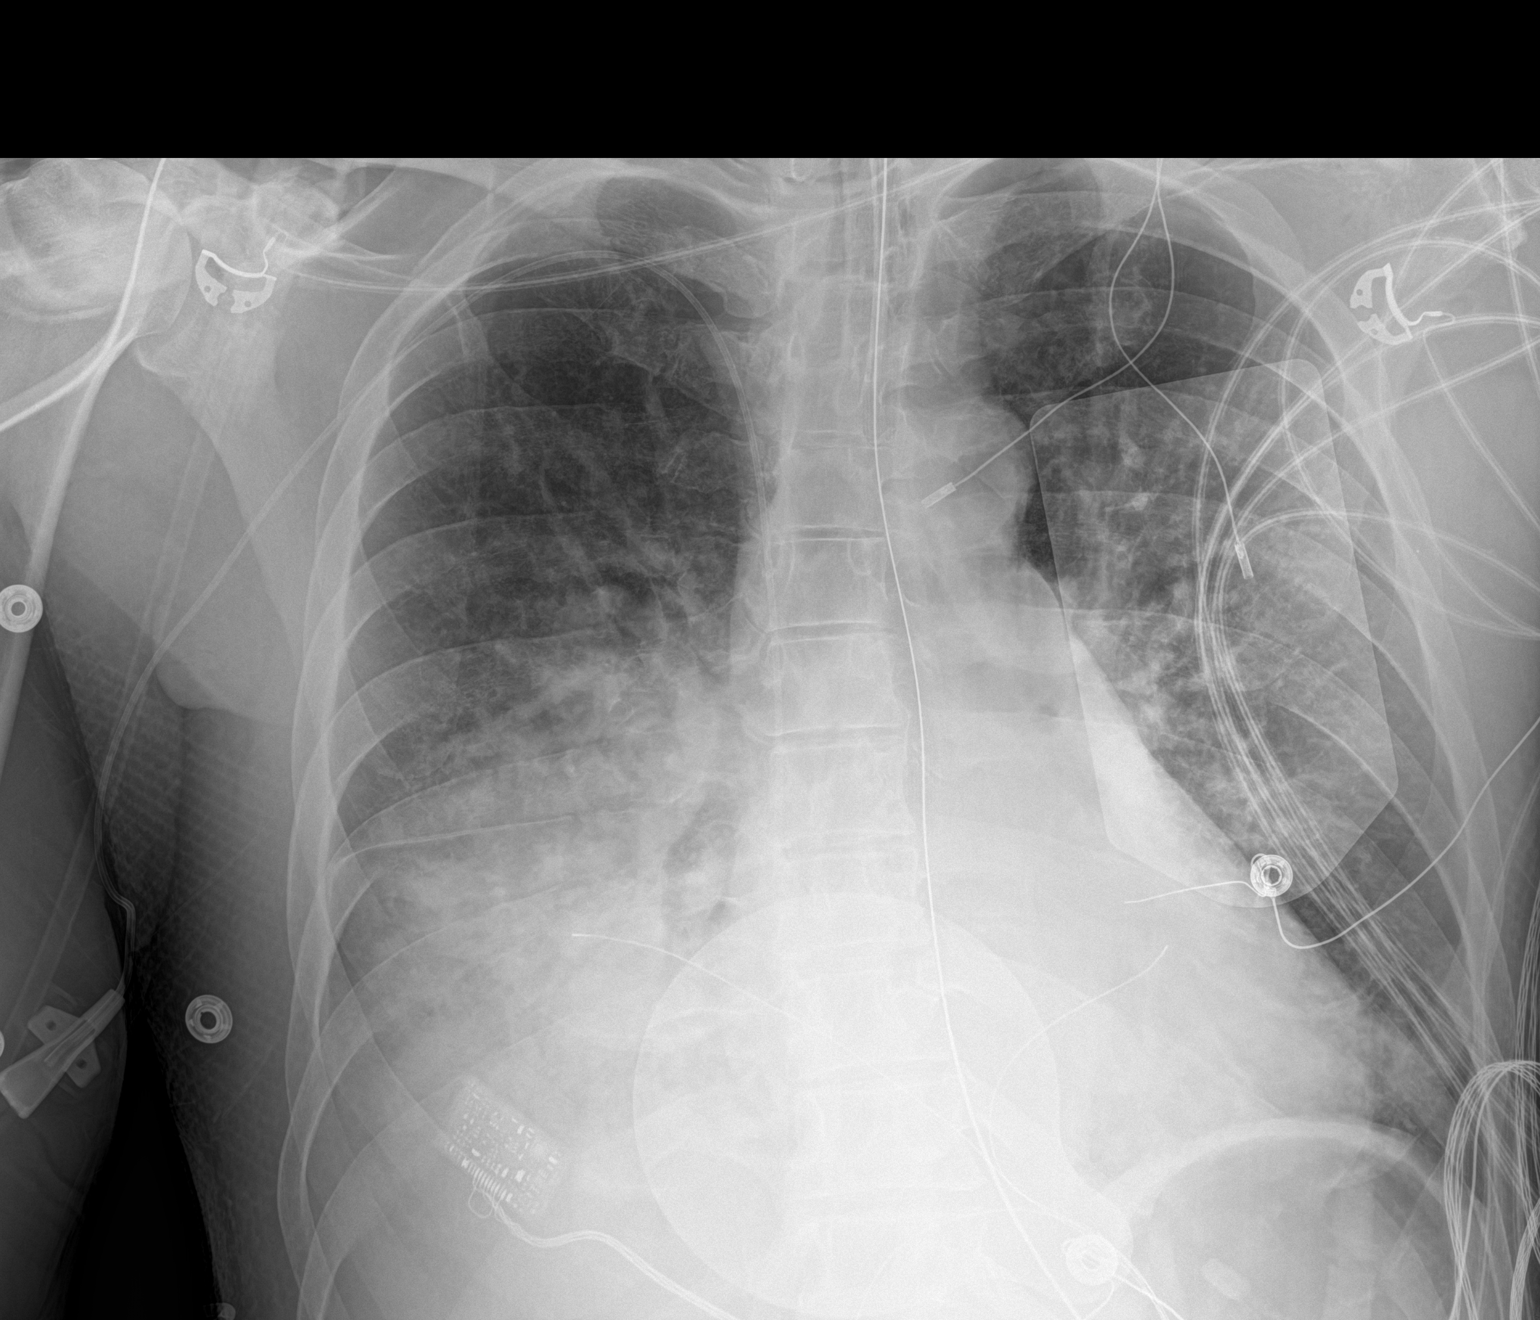

[1 of 1 positions shown; findings below may reference images not displayed]

FINDINGS: ET tube terminates 0.8 cm above the level of the carina. An enteric
tube passes below level of left hemidiaphragm with tip excluded from
the field of view. Right-sided PICC with tip at the level of the
superior cavoatrial junction. Overlying cardiac monitoring leads and
defibrillator pads.

The cardiomediastinal silhouette is unchanged. New from prior
examination, there is prominent airspace disease within the right
mid to lower lung field. A small right pleural effusion is difficult
to exclude. No airspace disease identified within the left lung. No
evidence of pneumothorax. No acute bony abnormality.
IMPRESSION: Support apparatus as described.

New extensive airspace disease within the right mid to lower lung
field consistent with the provided history of aspiration.

## 2019-07-03 MED ORDER — ONDANSETRON HCL 4 MG/2ML IJ SOLN
4.0000 mg | Freq: Four times a day (QID) | INTRAMUSCULAR | Status: DC | PRN
  Filled 2019-07-03: qty 2

## 2019-07-03 MED ORDER — MAGNESIUM SULFATE 2 GM/50ML IV SOLN
2.0000 g | Freq: Once | INTRAVENOUS | Status: AC
  Administered 2019-07-03: 2 g via INTRAVENOUS
  Filled 2019-07-03: qty 50

## 2019-07-03 MED ORDER — LORAZEPAM 2 MG/ML IJ SOLN
2.0000 mg | Freq: Once | INTRAMUSCULAR | Status: AC
  Administered 2019-07-03: 2 mg via INTRAVENOUS
  Filled 2019-07-03: qty 1

## 2019-07-03 MED ORDER — SODIUM CHLORIDE 0.9 % IV SOLN
2.0000 g | Freq: Three times a day (TID) | INTRAVENOUS | Status: AC
  Administered 2019-07-03 – 2019-07-06 (×12): 2 g via INTRAVENOUS
  Filled 2019-07-03 (×12): qty 2

## 2019-07-03 MED ORDER — BISACODYL 10 MG RE SUPP
10.0000 mg | Freq: Every day | RECTAL | Status: DC | PRN
  Administered 2019-07-06 – 2019-07-08 (×2): 10 mg via RECTAL
  Filled 2019-07-03: qty 1

## 2019-07-03 MED ORDER — GLYCERIN (LAXATIVE) 2.1 G RE SUPP
1.0000 | Freq: Every day | RECTAL | Status: AC
  Administered 2019-07-03 – 2019-07-04 (×2): 1 via RECTAL
  Filled 2019-07-03 (×2): qty 1

## 2019-07-03 MED ORDER — METOCLOPRAMIDE HCL 5 MG/ML IJ SOLN
10.0000 mg | Freq: Four times a day (QID) | INTRAMUSCULAR | Status: AC
  Administered 2019-07-03 – 2019-07-05 (×6): 10 mg via INTRAVENOUS
  Filled 2019-07-03 (×6): qty 2

## 2019-07-03 MED ORDER — POLYETHYLENE GLYCOL 3350 17 G PO PACK
17.0000 g | PACK | Freq: Every day | ORAL | Status: DC
  Administered 2019-07-04 – 2019-07-14 (×6): 17 g
  Filled 2019-07-03 (×7): qty 1

## 2019-07-03 MED ORDER — ACETAMINOPHEN 325 MG PO TABS
650.0000 mg | ORAL_TABLET | Freq: Four times a day (QID) | ORAL | Status: DC
  Administered 2019-07-03 – 2019-07-06 (×12): 650 mg via ORAL
  Filled 2019-07-03 (×12): qty 2

## 2019-07-03 MED ORDER — VITAL HIGH PROTEIN PO LIQD
1000.0000 mL | ORAL | Status: DC
  Administered 2019-07-03: 12:00:00 1000 mL

## 2019-07-03 MED ORDER — VITAL 1.5 CAL PO LIQD
1000.0000 mL | ORAL | Status: DC
  Filled 2019-07-03 (×4): qty 1000

## 2019-07-03 MED ORDER — ONDANSETRON HCL 4 MG/2ML IJ SOLN
4.0000 mg | Freq: Four times a day (QID) | INTRAMUSCULAR | Status: DC
  Administered 2019-07-03: 4 mg via INTRAVENOUS
  Filled 2019-07-03: qty 2

## 2019-07-03 MED ORDER — PRO-STAT SUGAR FREE PO LIQD
30.0000 mL | Freq: Three times a day (TID) | ORAL | Status: DC
  Administered 2019-07-03 – 2019-07-06 (×9): 30 mL
  Filled 2019-07-03 (×9): qty 30

## 2019-07-03 NOTE — Progress Notes (Signed)
LTM discontinued. No skin break down noted. 

## 2019-07-03 NOTE — Progress Notes (Signed)
Nutrition Follow up  DOCUMENTATION CODES:   Not applicable  INTERVENTION:   Tube feeding:  Vital 1.5 @ 55 ml/hr via OGT (1320 ml)  30 ml Prostat TID  Provides: 2280 kcals, 134 grams protein, 1008 ml free water.   NUTRITION DIAGNOSIS:   Inadequate oral intake related to inability to eat as evidenced by NPO status.  Ongoing  GOAL:   Patient will meet greater than or equal to 90% of their needs   Addressed via TF  MONITOR:   Vent status, Labs, I & O's  REASON FOR ASSESSMENT:   Ventilator    ASSESSMENT:   39 year old male presented with witnessed cardiac arrest. Admitted to Cardiac unit on normothermia protocol. PMH includes paroxysmal atrial fibrillation, polysubstance abuse.  Pt discussed during ICU rounds and with RN.   Rewarmed. Off pressors/propofol. Concern for aspiration this am. Vital High Protein turned down to 30 ml/hr by CCM. Will change formula to better meet needs and titrate to goal slowly.   Admission weight: 83.9 kg  Current weight: 90.4 kg   Patient remains intubated on ventilator support MV: 15.4 L/min Temp (24hrs), Avg:99 F (37.2 C), Min:98.6 F (37 C), Max:99.3 F (37.4 C)   I/O: +5,949 ml since admit UOP: 2,115 ml x 24 hrs   Drips: NS @ 10 ml/hr  Medications: folic acid, thiamine  Labs: CBG 83-97  Diet Order:   Diet Order    None      EDUCATION NEEDS:   Not appropriate for education at this time  Skin:  Skin Assessment: Reviewed RN Assessment  Last BM:  PTA  Height:   Ht Readings from Last 1 Encounters:  07/02/19 6\' 3"  (1.905 m)    Weight:   Wt Readings from Last 1 Encounters:  07/03/19 90.4 kg    BMI:  Body mass index is 24.91 kg/m.  Estimated Nutritional Needs:   Kcal:  2264 kcal  Protein:  130-145 grams  Fluid:  >/= 2.2 L/day   22[address] RD, LDN Clinical Nutrition Pager # - [phone]

## 2019-07-03 NOTE — Progress Notes (Signed)
RT NOTES; Placed patient back on full support d/t desaturation.

## 2019-07-03 NOTE — Progress Notes (Signed)
Pharmacy Antibiotic Note  Pedro Gomez is a 39 y.o. male admitted on 06/30/2019 with cardiac arrest s/p TTM. Pharmacy has been consulted for cefepime dosing with concerns for PNA.  Plan: Cefepime 2g IV q8h Follow cultures, renal function, LOT  Height: 6\' 3"  (190.5 cm) Weight: 199 lb 5.1 oz (90.4 kg)(subtracted 2.39 kg d/t water in pads) IBW/kg (Calculated) : 84.5  Temp (24hrs), Avg:99.1 F (37.3 C), Min:98.8 F (37.1 C), Max:99.3 F (37.4 C)  Recent Labs  Lab 06/30/19 2150 07/01/19 0248 07/01/19 0755 07/01/19 2256 07/02/19 0413 07/03/19 0214  WBC 9.3 10.0  --   --  10.3 14.1*  CREATININE 1.67* 1.38* 1.38* 1.07 1.00 0.90  LATICACIDVEN 6.0* 4.4*  --   --   --   --     Estimated Creatinine Clearance: 133 mL/min (by C-G formula based on SCr of 0.9 mg/dL).    No Known Allergies  Antimicrobials this admission: Cefepime 1/4 >>  Microbiology results: 1/1 BCx: NGTD 1/1 UCx: negative 1/2 MRSA: negative   08/31/19, PharmD, BCPS Clinical Pharmacist [phone] Please check AMION for all [facility] numbers 07/03/2019

## 2019-07-03 NOTE — Progress Notes (Signed)
After EEG Tech was finishing up, pt began to vomit, became tachy in the 130s and O2 in the high 88.... Suctioned pt, nothing came out but Sats increased to low 90s and HR dropped down to 110-115.   Will continue to monitor pt closely.

## 2019-07-03 NOTE — Significant Event (Signed)
Patient's mother called unit and requesting to pick up patient's belongings. There are 3 personal belonging bags in room. Items inventoried with unit secretary Tiffany and is placed in patient's chart. Unit secretary Tiffany delivered belongings to patient's mother at the main visitor entrance.   Rhetta Cleek

## 2019-07-03 NOTE — Progress Notes (Signed)
EEG complete - results pending 

## 2019-07-03 NOTE — Significant Event (Addendum)
Initial referral made to [facility] on 07/03/2019 at 0908am since this RN saw a blank referral sheet on patient's clipboard and spoken with representative Loletta Specter; given reference # [id number] and told that a coordinator will call back.    At [facility] Felipa Eth) called back. Gave information he requested and RN was told initial referral was made on 07/02/2019 and referral #[id number]. Placed this information on referral sheet inside patient's chipboard.   Stormie Ventola

## 2019-07-03 NOTE — Significant Event (Signed)
Noted twitching to left temporal and increased shivering when stimulated. Concern for seizures as patient eyes were twitching briefly with upward gaze. Dr. Katrinka Blazing notified and at bedside. New orders for 2mg  ativan and to place a progress note post ativan.   At 1910pm-after having received 2mg  ativan, noted no further witching to left temporal. Patient eyes are closed and sleeping comfortably. No other twitching noted on patient's body. No shivering noted. Water temperature on Artic sun have increased from 12 degrees to 24 degrees. Saturations up to 98-100% from the low 90s.   Patient's mother called twice this evening; RN provided updates and answered questions.   Wasted approximately 120cc of fentanyl into narcotic waste receptacle with another RN .     Pedro Gomez

## 2019-07-03 NOTE — Progress Notes (Signed)
PULMONARY / CRITICAL CARE MEDICINE   Name: Pedro Gomez MRN: [id number] DOB: 12-Sep-1980    ADMISSION DATE:  06/30/2019 CONSULTATION DATE:  06/30/19  CHIEF COMPLAINT:  shock  HISTORY OF PRESENT ILLNESS:   39 yo gentleman with history of afib, polysubstance use with witnessed cardiac arrest w/bystander followed by EMS CPR and vfib/vtach s/p defibrillation in the field.    PAST MEDICAL HISTORY :  He  has no past medical history on file.  PAST SURGICAL HISTORY: He  has no past surgical history on file.  No Known Allergies  No current facility-administered medications on file prior to encounter.   Current Outpatient Medications on File Prior to Encounter  Medication Sig  . famotidine (PEPCID) 10 MG tablet Take 10 mg by mouth 2 (two) times daily as needed for heartburn or indigestion.    FAMILY HISTORY:  His has no family status information on file.    SOCIAL HISTORY: He  has an unknown smoking status. He has never used smokeless tobacco. He reports current drug use. Drug: Marijuana.  REVIEW OF SYSTEMS:   Intubated, sedated  SUBJECTIVE:  Overnight weaned off all pressor support, nurse reports likely aspiration event with subsequent coughing and decrease in oxygenation noted on pulse oximetry.  X-ray ordered.  VITAL SIGNS: BP 127/77   Pulse (!) 117   Temp 99 F (37.2 C) (Core)   Resp (!) 26   Ht 6\' 3"  (1.905 m)   Wt 90.4 kg Comment: subtracted 2.39 kg d/t water in pads  SpO2 (!) 89%   BMI 24.91 kg/m   HEMODYNAMICS:    VENTILATOR SETTINGS: Vent Mode: PRVC FiO2 (%):  [40 %] 40 % Set Rate:  [12 bmp] 12 bmp Vt Set:  [650 mL] 650 mL PEEP:  [5 cmH20] 5 cmH20 Plateau Pressure:  [14 cmH20-20 cmH20] 14 cmH20  INTAKE / OUTPUT: I/O last 3 completed shifts: In: 5423.9 [I.V.:4373.4; IV Piggyback:1050.4] Out: 3329 [Urine:1285; Emesis/NG output:450]  PHYSICAL EXAMINATION: General:  Well nourished AAM Neuro:  Intubated and sedated HEENT:  ET tube in  place Cardiovascular:  Tachycardia with normal rhythm, No MRG, no LE edema Lungs:  Left lung clear, Right upper field clear, Coarse sounds in lower fields Abdomen:  Non distended, normal bs Skin:  No rashes or bruising noted  LABS:  BMET Recent Labs  Lab 07/01/19 2256 07/02/19 0413 07/03/19 0214  NA 139 141 138  K 4.5 4.2 4.8  CL 114* 111 107  CO2 21* 21* 23  BUN 18 16 11   CREATININE 1.07 1.00 0.90  GLUCOSE 84 83 79    Electrolytes Recent Labs  Lab 07/01/19 2256 07/02/19 0413 07/02/19 1245 07/02/19 1632 07/03/19 0214  CALCIUM 7.6* 8.1*  --   --  8.4*  MG  --  1.7 1.6* 2.0 1.7  PHOS  --   --  3.1 3.3 3.6    CBC Recent Labs  Lab 07/01/19 0248 07/02/19 0413 07/03/19 0214  WBC 10.0 10.3 14.1*  HGB 12.8* 11.9* 12.9*  HCT 39.5 36.5* 40.1  PLT 210 187 112*    Coag's Recent Labs  Lab 06/30/19 2150 07/01/19 0755  APTT 28 30  INR 1.1 1.1    Sepsis Markers Recent Labs  Lab 06/30/19 2150 07/01/19 0248  LATICACIDVEN 6.0* 4.4*    ABG Recent Labs  Lab 06/30/19 2335  PHART 7.368  PCO2ART 34.1  PO2ART 430.0*    Liver Enzymes Recent Labs  Lab 06/30/19 2150 07/01/19 0248  AST 66* 80*  ALT 66*  76*  ALKPHOS 50 46  BILITOT 0.7 0.8  ALBUMIN 3.6 3.6    Cardiac Enzymes No results for input(s): TROPONINI, PROBNP in the last 168 hours.  Glucose Recent Labs  Lab 07/02/19 1208 07/02/19 1519 07/02/19 1626 07/02/19 2001 07/02/19 2349 07/03/19 0352  GLUCAP 57* 79 94 83 83 89    Imaging Overnight EEG with video  Result Date: 07/02/2019 Charlsie Quest, MD     07/02/2019  9:49 AM Patient Name: Pedro Gomez MRN: [id number] Epilepsy Attending: Charlsie Quest Referring Physician/Provider: Vernona Rieger Gleason, PA Duration: 07/01/2019 to 1521 to 07/02/2019 0930 Patient history: 38yo M s/p cardiac arrest on TTM. EEG to evaluate for seizure Level of alertness: comatose AEDs during EEG study: Propofol Technical aspects: This EEG study was done with scalp  electrodes positioned according to the 10-20 International system of electrode placement. Electrical activity was acquired at a sampling rate of 500Hz  and reviewed with a high frequency filter of 70Hz  and a low frequency filter of 1Hz . EEG data were recorded continuously and digitally stored. DESCRIPTION: EEG showed generalized 7.5-8.5hz  alpha activity as well as intermittent 1-3 seconds of generalized attenuation. EEG was not reactive to tactile stimulation. Hyperventilation and photic stimulation were not performed. Event button was pressed on 07/01/2019 at 2229 for unclear reasons. Concomitant eeg showed generalized myogenic artifact.  ABNORMALITY - Alpha activity, generalized - Background attenuation, generalized IMPRESSION: This study is suggestive of profound diffuse encephalopathy, non specific to etiology but could be seen due to sedation, hypoxic-anoxic injury. No seizures or epileptiform discharges were seen throughout the recording. Event button was pressed on 06/30/2018 at 2229 for unclear reasons. Concomitant eeg didn't show any change to suggest seizure and was likely due to shivering.   ECHOCARDIOGRAM COMPLETE  Result Date: 07/02/2019   ECHOCARDIOGRAM REPORT   Patient Name:   MYRICK MCNAIRY Date of Exam: 07/02/2019 Medical Rec #:  08/30/2019        Height: Accession #:    Cherylynn Ridges       Weight:       196.9 lb Date of Birth:  09/28/1980        BSA:          2.00 m Patient Age:    38 years         BP:           98/65 mmHg Patient Gender: M                HR:           79 bpm. Exam Location:  Inpatient Procedure: 2D Echo Indications:    cardiac arrest  History:        Patient has no prior history of Echocardiogram examinations.                 Patent foramen ovale; Risk Factors:Current Smoker and                 polysubstance abuse.  Sonographer:    [id number] Referring Phys: [id number] LAURA R GLEASON IMPRESSIONS  1. Left ventricular ejection fraction, by visual estimation, is 50 to 55%. The left  ventricle has low normal function. There is mildly increased left ventricular hypertrophy.  2. The left ventricle has no regional wall motion abnormalities.  3. Left atrial size was normal.  4. Right atrial size was normal.  5. Trivial pericardial effusion is present.  6. The pericardial effusion is posterior to the left ventricle.  7. The mitral valve is  grossly normal. No evidence of mitral valve regurgitation.  8. The tricuspid valve is grossly normal.  9. The aortic valve is tricuspid. Aortic valve regurgitation is not visualized. 10. The pulmonic valve was grossly normal. Pulmonic valve regurgitation is not visualized. 11. Global right ventricle has normal systolic function.The right ventricular size is normal. No increase in right ventricular wall thickness. 12. TR signal is inadequate for assessing pulmonary artery systolic pressure. 13. Intermittently seen bubbles in right heart, presumably from intavenous infusion. FINDINGS  Left Ventricle: Left ventricular ejection fraction, by visual estimation, is 50 to 55%. The left ventricle has low normal function. The left ventricle has no regional wall motion abnormalities. The left ventricular internal cavity size was the left ventricle is normal in size. There is mildly increased left ventricular hypertrophy. Left ventricular diastolic parameters were normal. Right Ventricle: The right ventricular size is normal. No increase in right ventricular wall thickness. Global RV systolic function is has normal systolic function. Left Atrium: Left atrial size was normal in size. Right Atrium: Right atrial size was normal in size Pericardium: Trivial pericardial effusion is present. The pericardial effusion is posterior to the left ventricle. Mitral Valve: The mitral valve is grossly normal. No evidence of mitral valve regurgitation. Tricuspid Valve: The tricuspid valve is grossly normal. Tricuspid valve regurgitation is trivial. Aortic Valve: The aortic valve is tricuspid.  Aortic valve regurgitation is not visualized. Pulmonic Valve: The pulmonic valve was grossly normal. Pulmonic valve regurgitation is not visualized. Pulmonic regurgitation is not visualized. Aorta: The aortic root is normal in size and structure. IAS/Shunts: No atrial level shunt detected by color flow Doppler.  LEFT VENTRICLE PLAX 2D LVIDd:         4.60 cm  Diastology LVIDs:         3.40 cm  LV e' lateral:   10.80 cm/s LV PW:         1.20 cm  LV E/e' lateral: 7.9 LV IVS:        1.10 cm  LV e' medial:    11.50 cm/s LVOT diam:     2.20 cm  LV E/e' medial:  7.4 LV SV:         50 ml LVOT Area:     3.80 cm  RIGHT VENTRICLE RV S prime:     14.50 cm/s TAPSE (M-mode): 1.7 cm LEFT ATRIUM           Index       RIGHT ATRIUM           Index LA diam:      2.80 cm 1.40 cm/m  RA Area:     15.10 cm LA Vol (A4C): 36.9 ml 18.47 ml/m RA Volume:   42.60 ml  21.32 ml/m  AORTIC VALVE LVOT Vmax:   67.20 cm/s LVOT Vmean:  48.000 cm/s LVOT VTI:    0.130 m  AORTA Ao Root diam: 3.00 cm MITRAL VALVE MV Area (PHT): 4.39 cm             SHUNTS MV PHT:        50.17 msec           Systemic VTI:  0.13 m MV Decel Time: 173 msec             Systemic Diam: 2.20 cm MV E velocity: 85.30 cm/s 103 cm/s MV A velocity: 59.10 cm/s 70.3 cm/s MV E/A ratio:  1.44       1.5  Nona Dell MD Electronically signed by Remi Deter  Mcdowell MD Signature Date/Time: 07/02/2019/12:47:09 PM    Final      CULTURES: 06/30/19 Urine: NGTD Respiratory: NGTD  ANTIBIOTICS: none  ASSESSMENT / PLAN:  NEUROLOGIC A: Encephalopathy: 2/2 poor cerebral perfusion from cardiac arrest.  He is intubated and sedated after cardiac arrest, and on targeted temperature management.   No seizure activity seen on EEG  P:   -Try and wean sedation today, continue to assess  PULMONARY A: Acute respiratory failure: intubated during cardiac arrest, initially no infectious pneumonic or parapnuemonic abnormalities.  Overnight R mid to lower lobe opacification consistent with  aspiration noted on x-ray along with decreased but still adequate oxygenation.    P:   -Ordered nasotracheal suctioning, start empiric cefepime given he is critically ill and has new leukocytosis/tachycardia -slow down tube feeds today to 30cc/hr -Full vent support, VAP ppx, wean sedation SBT when able  INFECTIOUS A:   Aspiration event with pneumonitis, high risk for subsequent PNA P:   -empiric treatment with cefepime  CARDIOVASCULAR A:  Cardiac arrest: likely cardiogenic shock, may have had some stunning from cocaine vs prolonged arrhythmia, ECHO delayed and shows recovery of EF, no RWMA seen consistent with his now minimally elevated troponin.  Off all vasopressors on 1/4 normotensive  P:  -close electrolyte monitoring and replacement, replaced mag today -urine output picking up and he is normotensive hold additional IVF today -continuous cardiac monitoring  HEMATOLOGIC A:   Mild normocytic anemia: likely dilutional  P:  -continue to monitor  GASTROINTESTINAL A:   Nutrition: currently intubated and sedated P:   -continue tube feeds  -continue protonix  FAMILY  - Updates: Calling and updating family today  - Inter-disciplinary family meet or Palliative Care meeting due by:  day 7  Pulmonary and Critical Care Medicine [facility] Pager: [phone] Thornell Mule MD PGY-3 Internal Medicine Pager # [phone]   07/03/2019, 6:30 AM

## 2019-07-03 NOTE — Significant Event (Signed)
Patient vomited bile, yellow secretions at 1345 after coughing aggressively and having copious airway secretions with coughing. RN stopped tube feeds; placed oral gastric tube to wall-suction and received back 500cc thus far. Airway and oral suctioned done multiple times. Mouth care done X 2. Bath given and with CHG. RRT made aware to change ventilatory circuits. New canisters and tubings. Dr. Frances Furbish in the unit and RN updated him. New orders to hold tube feeds and new antiemetic.      Pedro Gomez

## 2019-07-03 NOTE — Progress Notes (Signed)
Neuro exam still a little concerning.  Will open eyes to voice, R gaze preference, twitches toes to instructions. Would get EEG and give a little more time overnight. If still this bad tomorrow, MRI may be helpful to evaluate extent of anoxic injury.  Myrla Halsted MD

## 2019-07-03 NOTE — Significant Event (Signed)
Patient weaned on pressure support of 10 at 50% FIO2, adjusted weaning by Dr. Katrinka Blazing when he rounded at 1003am.    At 1050am, patient started coughing aggressively with copious secretions into HME and had popped off the ventilator briefly when RN still at bedside. Saturations dropped to the 80%.  RN increased FIO2 to 100%, airway suctioned with miminal secretions, and called respiratory to place patient back on fully support. Saturations back in the low 90s after interventions. Since sedation have been off, patient attempting to open eyes with verbal and tactile stimulation. No response to commands; moving in the bed spontaneously and is stiff when staff attempting to reposition him.     Pedro Gomez

## 2019-07-03 NOTE — Procedures (Addendum)
Patient Name: KENYA SHIRAISHI  MRN: [id number]  Epilepsy Attending: Charlsie Quest  Referring Physician/Provider: Emilie Rutter, PA Duration: 07/02/2019 1512 to 07/03/2019 0923   Patient history: 39yo M s/p cardiac arrest on TTM. EEG to evaluate for seizure  Level of alertness: comatose  AEDs during EEG study: Propofol  Technical aspects: This EEG study was done with scalp electrodes positioned according to the 10-20 International system of electrode placement. Electrical activity was acquired at a sampling rate of 500Hz  and reviewed with a high frequency filter of 70Hz  and a low frequency filter of 1Hz . EEG data were recorded continuously and digitally stored.   DESCRIPTION: EEG initially showed generalized 7.5-8.5hz  alpha activity.  Gradually EEG evolved into continuous generalized 3 to 6 Hz theta-delta slowing.  EEG was reactive to tactile stimulation. Hyperventilation and photic stimulation were not performed.  Event button was pressed on 07/02/2019 at 2233 for unclear reasons. Concomitant eeg showed generalized myogenic artifact.    ABNORMALITY - Alpha activity, generalized -Continuous slow, generalized  IMPRESSION: This study is suggestive of profound diffuse encephalopathy, non specific to etiology but could be seen due to sedation, hypoxic-anoxic injury. No seizures or epileptiform discharges were seen throughout the recording.  Event button was pressed on 06/30/2018 at 2233 for unclear reasons. Concomitant eeg didn't show any change to suggest seizure and was likely due to shivering.   EEG appears to be improving compared to previous study.  Yida Hyams [phone]

## 2019-07-04 LAB — CBC
HCT: 40.1 % (ref 39.0–52.0)
Hemoglobin: 13 g/dL (ref 13.0–17.0)
MCH: 30.5 pg (ref 26.0–34.0)
MCHC: 32.4 g/dL (ref 30.0–36.0)
MCV: 94.1 fL (ref 80.0–100.0)
Platelets: 160 10*3/uL (ref 150–400)
RBC: 4.26 MIL/uL (ref 4.22–5.81)
RDW: 12.1 % (ref 11.5–15.5)
WBC: 9.6 10*3/uL (ref 4.0–10.5)
nRBC: 0 % (ref 0.0–0.2)

## 2019-07-04 LAB — TRIGLYCERIDES: Triglycerides: 79 mg/dL (ref <150)

## 2019-07-04 LAB — BASIC METABOLIC PANEL
Anion gap: 7 (ref 5–15)
BUN: 23 mg/dL — ABNORMAL HIGH (ref 6–20)
CO2: 23 mmol/L (ref 22–32)
Calcium: 8.4 mg/dL — ABNORMAL LOW (ref 8.9–10.3)
Chloride: 107 mmol/L (ref 98–111)
Creatinine, Ser: 0.84 mg/dL (ref 0.61–1.24)
GFR calc Af Amer: >60 mL/min (ref >60)
GFR calc non Af Amer: >60 mL/min (ref >60)
Glucose, Bld: 112 mg/dL — ABNORMAL HIGH (ref 70–99)
Potassium: 4.6 mmol/L (ref 3.5–5.1)
Sodium: 137 mmol/L (ref 135–145)

## 2019-07-04 LAB — GLUCOSE, CAPILLARY
Glucose-Capillary: 113 mg/dL — ABNORMAL HIGH (ref 70–99)
Glucose-Capillary: 115 mg/dL — ABNORMAL HIGH (ref 70–99)
Glucose-Capillary: 116 mg/dL — ABNORMAL HIGH (ref 70–99)
Glucose-Capillary: 119 mg/dL — ABNORMAL HIGH (ref 70–99)
Glucose-Capillary: 121 mg/dL — ABNORMAL HIGH (ref 70–99)
Glucose-Capillary: 127 mg/dL — ABNORMAL HIGH (ref 70–99)
Glucose-Capillary: 433 mg/dL — ABNORMAL HIGH (ref 70–99)
Glucose-Capillary: 97 mg/dL (ref 70–99)

## 2019-07-04 MED ORDER — BISACODYL 10 MG RE SUPP
10.0000 mg | Freq: Once | RECTAL | Status: AC
  Administered 2019-07-04: 09:00:00 10 mg via RECTAL
  Filled 2019-07-04: qty 1

## 2019-07-04 MED ORDER — METOPROLOL TARTRATE 5 MG/5ML IV SOLN
5.0000 mg | Freq: Four times a day (QID) | INTRAVENOUS | Status: DC | PRN
  Administered 2019-07-04 (×2): 5 mg via INTRAVENOUS
  Filled 2019-07-04 (×2): qty 5

## 2019-07-04 MED ORDER — DEXTROSE 5 % IV SOLN: INTRAVENOUS | Status: AC

## 2019-07-04 NOTE — Procedures (Addendum)
Patient Name:Leven NAYTHAN DOUTHIT CMK:[id number] Epilepsy Attending:Xayvier Vallez Annabelle Harman Referring Physician/Provider:Laura Gleason, PA Date: 07/03/2019 Duration: 21.40 minutes  Patient history:38yo M s/p cardiac arrest on TTM. EEG to evaluate for seizure  Level of alertness:comatose  AEDs during EEG study:None  Technical aspects: This EEG study was done with scalp electrodes positioned according to the 10-20 International system of electrode placement. Electrical activity was acquired at a sampling rate of 500Hz  and reviewed with a high frequency filter of 70Hz  and a low frequency filter of 1Hz . EEG data were recorded continuously and digitally stored.  DESCRIPTION:EEG showed continuous generalized background attenuation. EEG was reactive to tactile stimulation.Hyperventilation and photic stimulation were not performed.  Of note, eeg was technically difficult due to significant myogenic artifact.  ABNORMALITY - Background attenuation, generalized  IMPRESSION: This technically difficult study issuggestive of profound diffuse encephalopathy, non specific to etiology but could be seen due to sedation, hypoxic-anoxic injury.No seizures or epileptiform discharges were seen throughout the recording.  Ebonye Reade 

## 2019-07-04 NOTE — Progress Notes (Signed)
Bite block was placed on ETT to help with biting. No complications were noted. RT was at bedside to help assist.

## 2019-07-04 NOTE — Progress Notes (Addendum)
PULMONARY / CRITICAL CARE MEDICINE   Name: Pedro Gomez MRN: [id number] DOB: 08/22/1980    ADMISSION DATE:  06/30/2019 CONSULTATION DATE:  06/30/19  CHIEF COMPLAINT:  shock  HISTORY OF PRESENT ILLNESS:   39 yo gentleman with history of afib, polysubstance use with witnessed cardiac arrest w/bystander followed by EMS CPR and vfib/vtach s/p defibrillation in the field.    PAST MEDICAL HISTORY :  He  has no past medical history on file.  PAST SURGICAL HISTORY: He  has no past surgical history on file.  No Known Allergies  No current facility-administered medications on file prior to encounter.   Current Outpatient Medications on File Prior to Encounter  Medication Sig  . famotidine (PEPCID) 10 MG tablet Take 10 mg by mouth 2 (two) times daily as needed for heartburn or indigestion.    FAMILY HISTORY:  His has no family status information on file.    SOCIAL HISTORY: He  has an unknown smoking status. He has never used smokeless tobacco. He reports current drug use. Drug: Marijuana.  REVIEW OF SYSTEMS:   Intubated, sedated  SUBJECTIVE:  Another vomiting episode overnight, requiring suctioning, Repeat EEG performed ant ativan given for possible seizure activity.    VITAL SIGNS: BP (!) 149/77   Pulse (!) 113   Temp 99 F (37.2 C) (Bladder)   Resp (!) 32   Ht 6\' 3"  (1.905 m)   Wt 87.5 kg Comment: Subtracted 2.39 kg from TTM Pads  SpO2 92%   BMI 24.11 kg/m   HEMODYNAMICS:    VENTILATOR SETTINGS: Vent Mode: PRVC FiO2 (%):  [40 %-80 %] 80 % Set Rate:  [12 bmp] 12 bmp Vt Set:  [650 mL] 650 mL PEEP:  [5 cmH20] 5 cmH20 Plateau Pressure:  [24 cmH20-27 cmH20] 27 cmH20  INTAKE / OUTPUT: I/O last 3 completed shifts: In: 2797.4 [I.V.:1491.4; NG/GT:856; IV Piggyback:450] Out: 0092 [Urine:3310; Emesis/NG output:751]  PHYSICAL EXAMINATION: General:  Well nourished AAM Neuro:  Off all sedation, Not following commands, not withdrawing to pain, some horizontal saccadic  eye movement to the right HEENT:  ET tube in place Cardiovascular:  Tachycardia with normal rhythm, No MRG, no LE edema Lungs:  Left lung clear, Right upper field clear, Coarse sounds in lower fields Abdomen:  Non distended, normal bs Skin:  No rashes or bruising noted  LABS:  BMET Recent Labs  Lab 07/02/19 0413 07/03/19 0214 07/04/19 0204  NA 141 138 137  K 4.2 4.8 4.6  CL 111 107 107  CO2 21* 23 23  BUN 16 11 23*  CREATININE 1.00 0.90 0.84  GLUCOSE 83 79 112*    Electrolytes Recent Labs  Lab 07/02/19 0413 07/02/19 1632 07/03/19 0214 07/03/19 1626 07/04/19 0204  CALCIUM 8.1*  --  8.4*  --  8.4*  MG 1.7 2.0 1.7 1.8  --   PHOS  --  3.3 3.6 2.8  --     CBC Recent Labs  Lab 07/02/19 0413 07/03/19 0214 07/04/19 0204  WBC 10.3 14.1* 9.6  HGB 11.9* 12.9* 13.0  HCT 36.5* 40.1 40.1  PLT 187 112* 160    Coag's Recent Labs  Lab 06/30/19 2150 07/01/19 0755  APTT 28 30  INR 1.1 1.1    Sepsis Markers Recent Labs  Lab 06/30/19 2150 07/01/19 0248 07/03/19 1008  LATICACIDVEN 6.0* 4.4*  --   PROCALCITON  --   --  2.67    ABG Recent Labs  Lab 06/30/19 2335  PHART 7.368  PCO2ART 34.1  PO2ART 430.0*    Liver Enzymes Recent Labs  Lab 06/30/19 2150 07/01/19 0248 07/03/19 1008  AST 66* 80* 41  ALT 66* 76* 41  ALKPHOS 50 46 63  BILITOT 0.7 0.8 0.8  ALBUMIN 3.6 3.6 2.9*    Cardiac Enzymes No results for input(s): TROPONINI, PROBNP in the last 168 hours.  Glucose Recent Labs  Lab 07/03/19 0813 07/03/19 1142 07/03/19 1552 07/03/19 2042 07/04/19 0028 07/04/19 0434  GLUCAP 91 97 115* 93 113* 97    Imaging DG Abd Portable 1V  Result Date: 07/03/2019 CLINICAL DATA:  Nausea and vomiting. EXAM: PORTABLE ABDOMEN - 1 VIEW COMPARISON:  None. FINDINGS: NG tube is in place in good position with both the tip and side-port in the stomach. Bowel gas pattern is benign. No abnormal abdominal calcification focal bony abnormality. IMPRESSION: No acute  finding. NG tube in good position. Electronically Signed   By: Drusilla Kanner M.D.   On: 07/03/2019 15:13     CULTURES: 06/30/19 Urine: NGTD Respiratory: NGTD Blood: NGTD ANTIBIOTICS: Cefepime 1/4--continued  ASSESSMENT / PLAN:  NEUROLOGIC A: Encephalopathy: 2/2 poor cerebral perfusion from cardiac arrest.  He is intubated and sedated after cardiac arrest, and on targeted temperature management.   No seizure activity seen on initial EEG.  Twitching temporal muscle and abnormal eye movement noted 1/5 evening EEG repeated  P:   -Follow EEG, serial neurologic checks may need MRI   PULMONARY A: Acute respiratory failure: intubated during cardiac arrest, initially no infectious pneumonic or parapnuemonic abnormalities.  Aspiration event early am of 1/3 with new R mid to lower lobe opacification consistent with aspiration noted on x-ray along with decreased but still adequate oxygenation.    P:   -continue working on aspiration prevention, tube feeds on hold -continue cefepime -Full vent support, VAP ppx, wean sedation SBT when able  INFECTIOUS A:   Aspiration event with pneumonitis, high risk for subsequent PNA P:   -continue empiric treatment with cefepime, wbc normalized  CARDIOVASCULAR A:  Cardiac arrest: likely cardiogenic shock, may have had some stunning from cocaine vs prolonged arrhythmia, ECHO delayed and shows recovery of EF, no RWMA seen consistent with his now minimally elevated troponin.  Off all vasopressors on 1/4 normotensive  P:  -close electrolyte monitoring and replacement -support with maintenance fluids today given no enteric hydration -continuous cardiac monitoring  HEMATOLOGIC A:   Mild normocytic anemia: was dilutional resolved P:  -continue to monitor  GASTROINTESTINAL A:   Nutrition: currently intubated and sedated Vomiting episodes: two vomiting events so far one resulting in aspiration P:   -holding tube feeds, continue reglan and  suppositories, bowel regimen -continue protonix  FAMILY  - Updates: Calling and updating family today  - Inter-disciplinary family meet or Palliative Care meeting due by:  day 7  Pulmonary and Critical Care Medicine [facility] Pager: [phone] Thornell Mule MD PGY-3 Internal Medicine Pager # [phone]   07/04/2019, 7:30 AM

## 2019-07-05 ENCOUNTER — Encounter (HOSPITAL_COMMUNITY): Payer: Self-pay | Admitting: Critical Care Medicine

## 2019-07-05 ENCOUNTER — Inpatient Hospital Stay (HOSPITAL_COMMUNITY): Payer: Self-pay

## 2019-07-05 DIAGNOSIS — G9341 Metabolic encephalopathy: Secondary | ICD-10-CM

## 2019-07-05 LAB — CULTURE, BLOOD (ROUTINE X 2)
Culture: NO GROWTH
Culture: NO GROWTH
Special Requests: ADEQUATE
Special Requests: ADEQUATE

## 2019-07-05 LAB — BASIC METABOLIC PANEL
Anion gap: 10 (ref 5–15)
BUN: 23 mg/dL — ABNORMAL HIGH (ref 6–20)
CO2: 26 mmol/L (ref 22–32)
Calcium: 8.5 mg/dL — ABNORMAL LOW (ref 8.9–10.3)
Chloride: 101 mmol/L (ref 98–111)
Creatinine, Ser: 0.95 mg/dL (ref 0.61–1.24)
GFR calc Af Amer: >60 mL/min (ref >60)
GFR calc non Af Amer: >60 mL/min (ref >60)
Glucose, Bld: 113 mg/dL — ABNORMAL HIGH (ref 70–99)
Potassium: 3.7 mmol/L (ref 3.5–5.1)
Sodium: 137 mmol/L (ref 135–145)

## 2019-07-05 LAB — CBC
HCT: 33.2 % — ABNORMAL LOW (ref 39.0–52.0)
Hemoglobin: 11.2 g/dL — ABNORMAL LOW (ref 13.0–17.0)
MCH: 30.9 pg (ref 26.0–34.0)
MCHC: 33.7 g/dL (ref 30.0–36.0)
MCV: 91.5 fL (ref 80.0–100.0)
Platelets: 165 10*3/uL (ref 150–400)
RBC: 3.63 MIL/uL — ABNORMAL LOW (ref 4.22–5.81)
RDW: 12.2 % (ref 11.5–15.5)
WBC: 9 10*3/uL (ref 4.0–10.5)
nRBC: 0 % (ref 0.0–0.2)

## 2019-07-05 LAB — AMMONIA: Ammonia: 11 umol/L (ref 9–35)

## 2019-07-05 LAB — GLUCOSE, CAPILLARY
Glucose-Capillary: 105 mg/dL — ABNORMAL HIGH (ref 70–99)
Glucose-Capillary: 114 mg/dL — ABNORMAL HIGH (ref 70–99)
Glucose-Capillary: 115 mg/dL — ABNORMAL HIGH (ref 70–99)
Glucose-Capillary: 103 mg/dL — ABNORMAL HIGH (ref 70–99)
Glucose-Capillary: 110 mg/dL — ABNORMAL HIGH (ref 70–99)

## 2019-07-05 LAB — PHOSPHORUS: Phosphorus: 1.9 mg/dL — ABNORMAL LOW (ref 2.5–4.6)

## 2019-07-05 LAB — CK: Total CK: 982 U/L — ABNORMAL HIGH (ref 49–397)

## 2019-07-05 IMAGING — MR MR HEAD W/O CM
13 series · 48 of 48 positions shown · non-contrast
Comparison: None.

CLINICAL DATA: Encephalopathy and loss of consciousness.

EXAM:
MRI HEAD WITHOUT CONTRAST
TECHNIQUE: Multiplanar, multiecho pulse sequences of the brain and surrounding
structures were obtained without intravenous contrast.

[Series 5: DWI · axial · 3.0mm · 0.88mm/px · z∈[-119,+20]mm · 7 of 98 slices shown (1 of 4)]
[im 1/98]
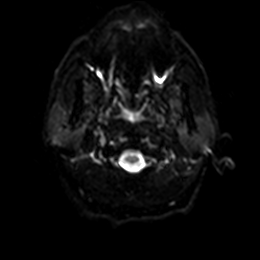
[im 17/98]
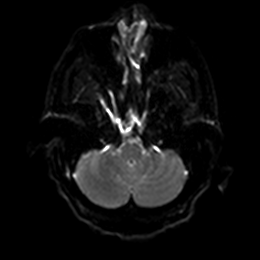
[im 33/98]
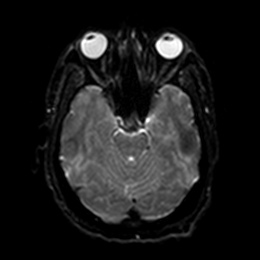
[im 49/98]
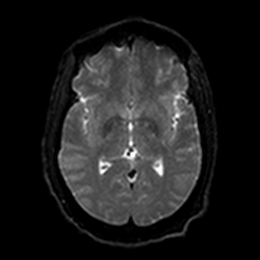
[im 65/98]
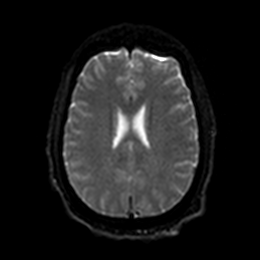
[im 81/98]
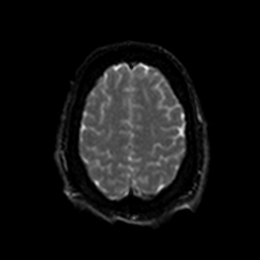
[im 98/98]
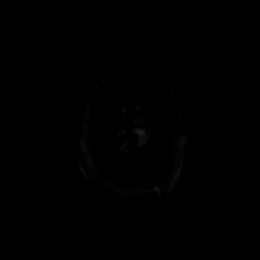

[Series 6: DWI · axial · 3.0mm · 0.88mm/px · z∈[-119,+20]mm · 4 of 49 slices shown (2 of 4)]
[im 1/49]
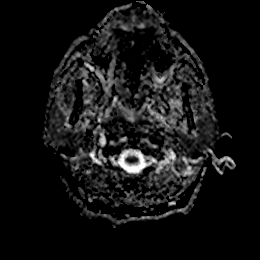
[im 17/49]
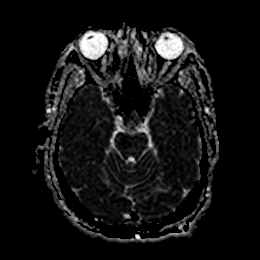
[im 33/49]
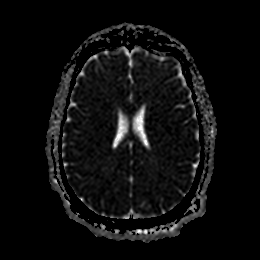
[im 49/49]
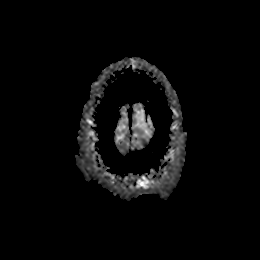

[Series 7: DWI · coronal · 4.0mm · 0.88mm/px · 6 of 70 slices shown (3 of 4)]
[im 1/70]
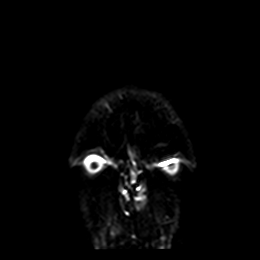
[im 14/70]
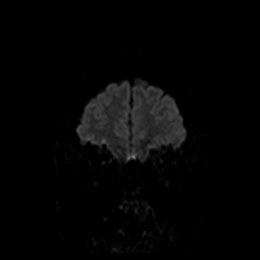
[im 28/70]
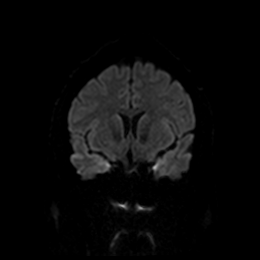
[im 42/70]
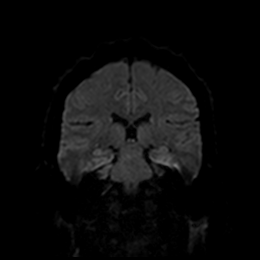
[im 56/70]
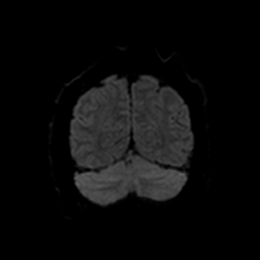
[im 70/70]
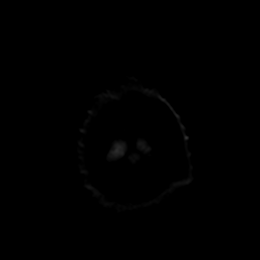

[Series 8: DWI · coronal · 4.0mm · 0.88mm/px · 3 of 35 slices shown (4 of 4)]
[im 1/35]
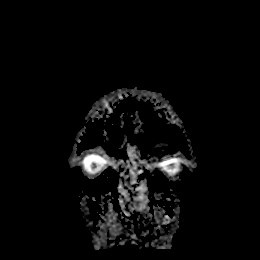
[im 18/35]
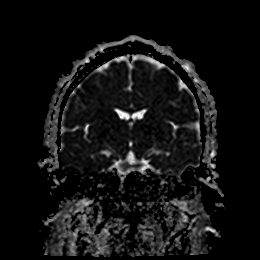
[im 35/35]
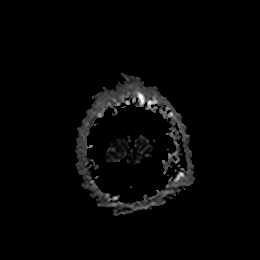

[Series 9: T1 · sagittal · 5.0mm · 0.75mm/px · 2 of 23 slices shown]
[im 1/23]
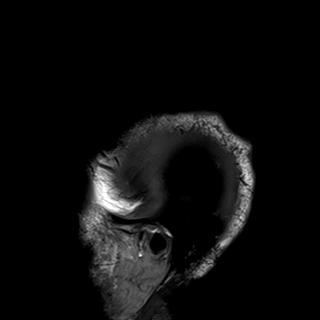
[im 23/23]
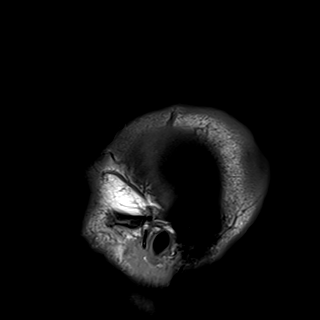

[Series 10: T2 · axial · 5.0mm · 0.72mm/px · z∈[-115,+24]mm · 2 of 25 slices shown (1 of 2)]
[im 1/25]
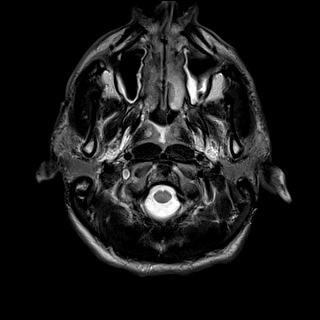
[im 25/25]
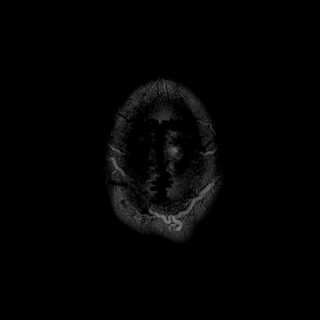

[Series 11: FLAIR · axial · 5.0mm · 0.45mm/px · z∈[-115,+24]mm · 2 of 25 slices shown (1 of 2)]
[im 1/25]
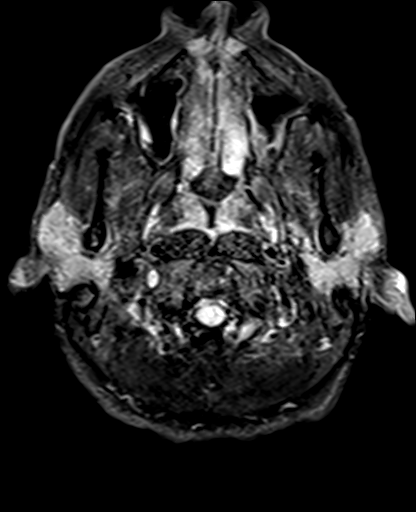
[im 25/25]
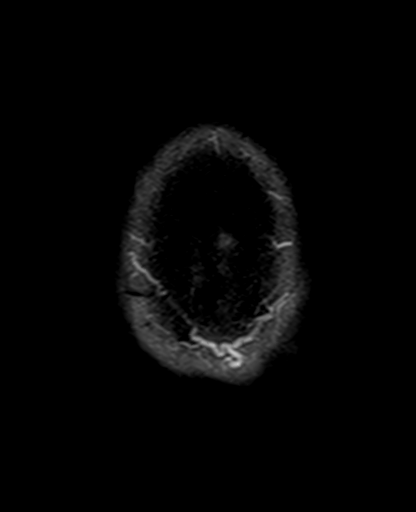

[Series 12: mag_images · axial · 3.0mm · 0.90mm/px · z∈[-126,+44]mm · 5 of 60 slices shown]
[im 1/60]
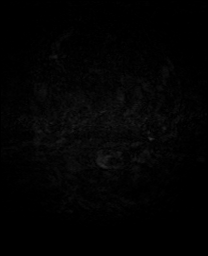
[im 15/60]
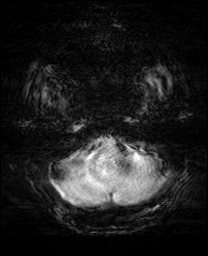
[im 30/60]
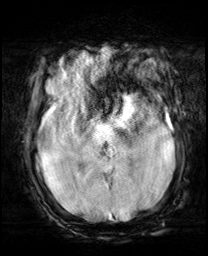
[im 45/60]
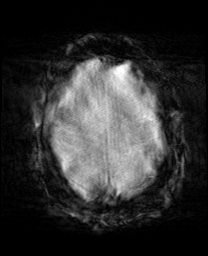
[im 60/60]
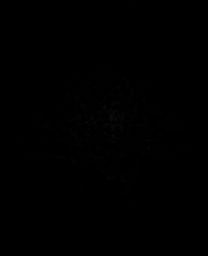

[Series 13: pha_images · axial · 3.0mm · 0.90mm/px · z∈[-118,+39]mm · 4 of 55 slices shown]
[im 1/55]
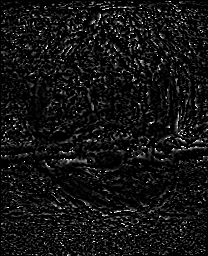
[im 19/55]
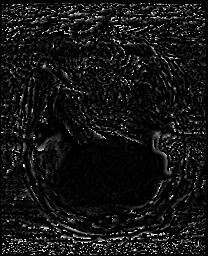
[im 37/55]
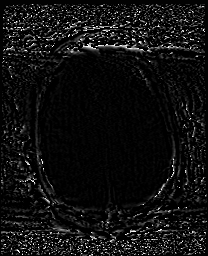
[im 55/55]
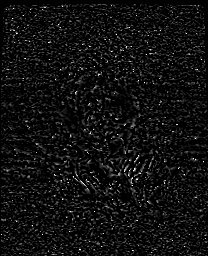

[Series 14: swi_images · axial · 3.0mm · 0.90mm/px · z∈[-126,+44]mm · 5 of 60 slices shown]
[im 1/60]
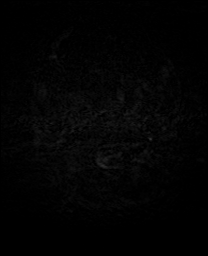
[im 15/60]
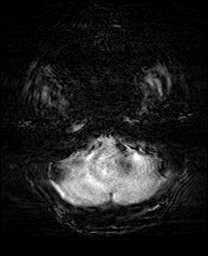
[im 30/60]
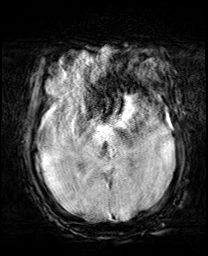
[im 45/60]
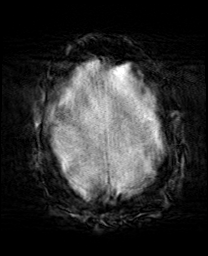
[im 60/60]
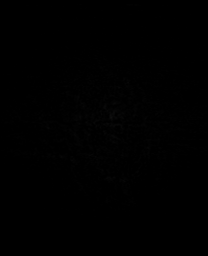

[Series 15: mip_images(sw) · axial · 24.0mm · 0.90mm/px · z∈[-116,+34]mm · 4 of 53 slices shown]
[im 1/53]
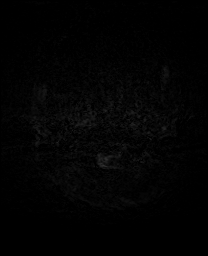
[im 18/53]
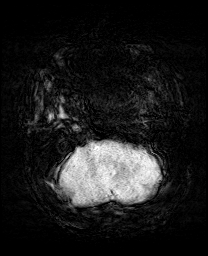
[im 35/53]
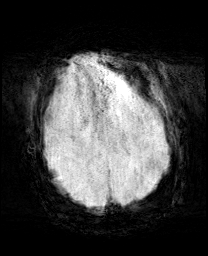
[im 53/53]
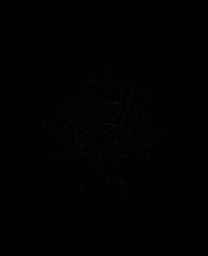

[Series 16: FLAIR · axial · 5.0mm · 0.90mm/px · z∈[-115,+24]mm · 2 of 25 slices shown (2 of 2)]
[im 1/25]
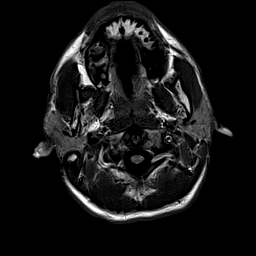
[im 25/25]
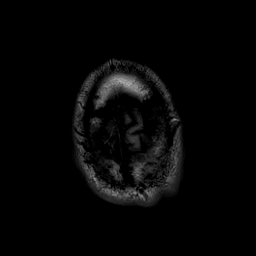

[Series 17: T2 · coronal · 5.0mm · 0.72mm/px · 2 of 28 slices shown (2 of 2)]
[im 1/28]
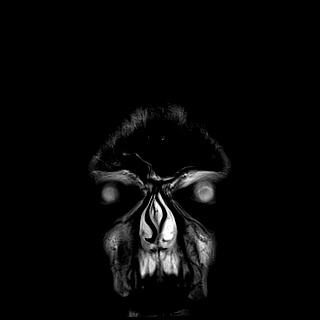
[im 28/28]
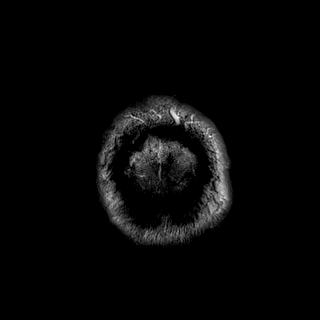

[48 of 48 positions shown; findings below may reference images not displayed]

FINDINGS: BRAIN: There is no acute infarct, acute hemorrhage or extra-axial
collection. The white matter signal is normal for the patient's age.
The cerebral and cerebellar volume are age-appropriate. There is no
hydrocephalus. The midline structures are normal.

VASCULAR: The major intracranial arterial and venous sinus flow
voids are normal. Susceptibility-sensitive sequences show no chronic
microhemorrhage or superficial siderosis.

SKULL AND UPPER CERVICAL SPINE: Calvarial bone marrow signal is
normal. There is no skull base mass. The visualized upper cervical
spine and soft tissues are normal.

SINUSES/ORBITS: Small amount of bilateral mastoid fluid. Mild
diffuse paranasal sinus mucosal thickening. The orbits are normal.
IMPRESSION: Normal MRI of the brain.

## 2019-07-05 MED ORDER — SODIUM PHOSPHATES 45 MMOLE/15ML IV SOLN
10.0000 mmol | Freq: Once | INTRAVENOUS | Status: AC
  Administered 2019-07-05: 10 mmol via INTRAVENOUS
  Filled 2019-07-05: qty 3.33

## 2019-07-05 MED ORDER — DEXMEDETOMIDINE HCL IN NACL 400 MCG/100ML IV SOLN
0.4000 ug/kg/h | INTRAVENOUS | Status: DC
  Administered 2019-07-05: 1 ug/kg/h via INTRAVENOUS
  Administered 2019-07-05: 0.4 ug/kg/h via INTRAVENOUS
  Administered 2019-07-06: 1 ug/kg/h via INTRAVENOUS
  Administered 2019-07-06: 0.4 ug/kg/h via INTRAVENOUS
  Administered 2019-07-06 (×2): 1 ug/kg/h via INTRAVENOUS
  Administered 2019-07-07: 1.5 ug/kg/h via INTRAVENOUS
  Administered 2019-07-07 (×2): 1.8 ug/kg/h via INTRAVENOUS
  Administered 2019-07-07: 1.4 ug/kg/h via INTRAVENOUS
  Administered 2019-07-07: 0.8 ug/kg/h via INTRAVENOUS
  Administered 2019-07-07 (×2): 1.4 ug/kg/h via INTRAVENOUS
  Administered 2019-07-08: 1 ug/kg/h via INTRAVENOUS
  Administered 2019-07-08: 1.8 ug/kg/h via INTRAVENOUS
  Administered 2019-07-08: 22:00:00 0.6 ug/kg/h via INTRAVENOUS
  Administered 2019-07-08: 11:00:00 1.8 ug/kg/h via INTRAVENOUS
  Filled 2019-07-05 (×4): qty 100
  Filled 2019-07-05: qty 200
  Filled 2019-07-05 (×2): qty 100
  Filled 2019-07-05: qty 200
  Filled 2019-07-05 (×9): qty 100

## 2019-07-05 MED ORDER — DEXTROSE 5 % IV SOLN: INTRAVENOUS | Status: DC

## 2019-07-05 MED ORDER — ONDANSETRON HCL 4 MG/2ML IJ SOLN
4.0000 mg | Freq: Four times a day (QID) | INTRAMUSCULAR | Status: DC | PRN
  Administered 2019-07-09: 4 mg via INTRAVENOUS
  Filled 2019-07-05: qty 2

## 2019-07-05 MED ORDER — FENTANYL CITRATE (PF) 100 MCG/2ML IJ SOLN
25.0000 ug | INTRAMUSCULAR | Status: DC | PRN
  Administered 2019-07-05 – 2019-07-08 (×8): 50 ug via INTRAVENOUS
  Filled 2019-07-05 (×7): qty 2

## 2019-07-05 MED ORDER — POTASSIUM PHOSPHATES 15 MMOLE/5ML IV SOLN
20.0000 mmol | Freq: Once | INTRAVENOUS | Status: AC
  Administered 2019-07-05: 20 mmol via INTRAVENOUS
  Filled 2019-07-05: qty 6.67

## 2019-07-05 MED ORDER — METOCLOPRAMIDE HCL 5 MG/ML IJ SOLN
10.0000 mg | Freq: Four times a day (QID) | INTRAMUSCULAR | Status: AC
  Administered 2019-07-05 – 2019-07-06 (×3): 10 mg via INTRAVENOUS
  Filled 2019-07-05 (×3): qty 2

## 2019-07-05 MED ORDER — VITAL 1.5 CAL PO LIQD
1000.0000 mL | ORAL | Status: DC
  Administered 2019-07-06: 1000 mL
  Filled 2019-07-05 (×2): qty 1000

## 2019-07-05 NOTE — Progress Notes (Addendum)
PULMONARY / CRITICAL CARE MEDICINE   Name: Pedro Gomez MRN: [id number] DOB: 1980-11-03    ADMISSION DATE:  06/30/2019 CONSULTATION DATE:  06/30/19  CHIEF COMPLAINT:  shock  HISTORY OF PRESENT ILLNESS:   39 yo gentleman with history of afib, polysubstance use with witnessed cardiac arrest w/bystander followed by EMS CPR and vfib/vtach s/p defibrillation in the field.    PAST MEDICAL HISTORY :  He  has no past medical history on file.  PAST SURGICAL HISTORY: He  has no past surgical history on file.  No Known Allergies  No current facility-administered medications on file prior to encounter.   Current Outpatient Medications on File Prior to Encounter  Medication Sig  . famotidine (PEPCID) 10 MG tablet Take 10 mg by mouth 2 (two) times daily as needed for heartburn or indigestion.    FAMILY HISTORY:  His has no family status information on file.    SOCIAL HISTORY: He  has an unknown smoking status. He has never used smokeless tobacco. He reports current drug use. Drug: Marijuana.  REVIEW OF SYSTEMS:   Intubated  SUBJECTIVE:  Still having vomiting episodes, no BM, biting ET tube and bite block placed, nurse reports some improvement in neurological status  VITAL SIGNS: BP 112/69 Comment: Simultaneous filing. User may not have seen previous data.  Pulse (!) 109 Comment: Simultaneous filing. User may not have seen previous data.  Temp (!) 101.3 F (38.5 C) Comment: Simultaneous filing. User may not have seen previous data.  Resp 19 Comment: Simultaneous filing. User may not have seen previous data.  Ht 6\' 3"  (1.905 m)   Wt 83.5 kg   SpO2 99% Comment: Simultaneous filing. User may not have seen previous data.  BMI 23.01 kg/m   HEMODYNAMICS:    VENTILATOR SETTINGS: Vent Mode: PRVC FiO2 (%):  [80 %-100 %] 80 % Set Rate:  [12 bmp] 12 bmp Vt Set:  [650 mL] 650 mL PEEP:  [5 cmH20] 5 cmH20 Plateau Pressure:  [21 cmH20-34 cmH20] 34 cmH20  INTAKE / OUTPUT: I/O last 3  completed shifts: In: 2176.1 [I.V.:1675.9; IV Piggyback:500.1] Out: 03-16-2001; Emesis/NG output:1150]  PHYSICAL EXAMINATION: General:  Well nourished AAM Neuro:   this morning appears to try and follow commands but can only turn head no movement of extremities, does not wiggle toes, able to track you with eye movements, ?denies sensation in extremities HEENT:  ET tube in place Cardiovascular:  Tachycardia with normal rhythm, No MRG, no LE edema Lungs:  Left lung clear, Right upper field clear, Coarse sounds in lower fields Abdomen:  Non distended, normal bs Skin:  No rashes or bruising noted  LABS:  BMET Recent Labs  Lab 07/03/19 0214 07/04/19 0204 07/05/19 0401  NA 138 137 137  K 4.8 4.6 3.7  CL 107 107 101  CO2 23 23 26   BUN 11 23* 23*  CREATININE 0.90 0.84 0.95  GLUCOSE 79 112* 113*    Electrolytes Recent Labs  Lab 07/02/19 1632 07/03/19 0214 07/03/19 1626 07/04/19 0204 07/05/19 0401  CALCIUM  --  8.4*  --  8.4* 8.5*  MG 2.0 1.7 1.8  --   --   PHOS 3.3 3.6 2.8  --   --     CBC Recent Labs  Lab 07/03/19 0214 07/04/19 0204 07/05/19 0401  WBC 14.1* 9.6 9.0  HGB 12.9* 13.0 11.2*  HCT 40.1 40.1 33.2*  PLT 112* 160 165    Coag's Recent Labs  Lab 06/30/19 2150 07/01/19 0755  APTT 28 30  INR 1.1 1.1    Sepsis Markers Recent Labs  Lab 06/30/19 2150 07/01/19 0248 07/03/19 1008  LATICACIDVEN 6.0* 4.4*  --   PROCALCITON  --   --  2.67    ABG Recent Labs  Lab 06/30/19 2335  PHART 7.368  PCO2ART 34.1  PO2ART 430.0*    Liver Enzymes Recent Labs  Lab 06/30/19 2150 07/01/19 0248 07/03/19 1008  AST 66* 80* 41  ALT 66* 76* 41  ALKPHOS 50 46 63  BILITOT 0.7 0.8 0.8  ALBUMIN 3.6 3.6 2.9*    Cardiac Enzymes No results for input(s): TROPONINI, PROBNP in the last 168 hours.  Glucose Recent Labs  Lab 07/04/19 2005 07/04/19 2009 07/04/19 2329 07/05/19 0353 07/05/19 0358 07/05/19 0818  GLUCAP 433* 115* 121* >600* 105* 114*     Imaging MR BRAIN WO CONTRAST  Result Date: 07/05/2019 CLINICAL DATA:  Encephalopathy and loss of consciousness. EXAM: MRI HEAD WITHOUT CONTRAST TECHNIQUE: Multiplanar, multiecho pulse sequences of the brain and surrounding structures were obtained without intravenous contrast. COMPARISON:  None. FINDINGS: BRAIN: There is no acute infarct, acute hemorrhage or extra-axial collection. The white matter signal is normal for the patient's age. The cerebral and cerebellar volume are age-appropriate. There is no hydrocephalus. The midline structures are normal. VASCULAR: The major intracranial arterial and venous sinus flow voids are normal. Susceptibility-sensitive sequences show no chronic microhemorrhage or superficial siderosis. SKULL AND UPPER CERVICAL SPINE: Calvarial bone marrow signal is normal. There is no skull base mass. The visualized upper cervical spine and soft tissues are normal. SINUSES/ORBITS: Small amount of bilateral mastoid fluid. Mild diffuse paranasal sinus mucosal thickening. The orbits are normal. IMPRESSION: Normal MRI of the brain. Electronically Signed   By: Deatra Robinson M.D.   On: 07/05/2019 03:22   EEG adult  Result Date: 07/04/2019 Pedro Quest, MD     07/04/2019  9:34 AM Patient Name:Pedro Gomez YBO:[id number] Epilepsy Attending:Priyanka Annabelle Harman Referring Physician/Provider:Laura Gleason, PA Date: 07/03/2019 Duration: 21.40 minutes  Patient history:38yo M s/p cardiac arrest on TTM. EEG to evaluate for seizure  Level of alertness:comatose  AEDs during EEG study:None  Technical aspects: This EEG study was done with scalp electrodes positioned according to the 10-20 International system of electrode placement. Electrical activity was acquired at a sampling rate of 500Hz  and reviewed with a high frequency filter of 70Hz  and a low frequency filter of 1Hz . EEG data were recorded continuously and digitally stored.  DESCRIPTION:EEG showed continuous generalized  background attenuation. EEG was reactive to tactile stimulation.Hyperventilation and photic stimulation were not performed. Of note, eeg was technically difficult due to significant myogenic artifact.  ABNORMALITY - Background attenuation, generalized  IMPRESSION: This technically difficult study issuggestive of profound diffuse encephalopathy, non specific to etiology but could be seen due to sedation, hypoxic-anoxic injury.No seizures or epileptiform discharges were seen throughout the recording.      CULTURES: 06/30/19 Urine: NGTD Respiratory: NGTD Blood: NGTD ANTIBIOTICS: Cefepime 1/4--continued  ASSESSMENT / PLAN:  NEUROLOGIC A: Encephalopathy: 2/2 poor cerebral perfusion from cardiac arrest.  He was intubated and sedated after cardiac arrest, and on targeted temperature management.   No seizure activity seen on initial EEG.  Twitching temporal muscle and abnormal eye movement noted 1/5 evening EEG repeated with no sz activity.  Normal MRI brain 1/5.  Following commands 1/6 but no extremity movement  P:   -neurologic status with improvement today, consulted neurology-reasonable to do MRI T and C spine rule out  spinal infarct  PULMONARY A: Acute respiratory failure: intubated during cardiac arrest, initially no infectious pneumonic or parapnuemonic abnormalities.  Aspiration event early am of 1/3 with new R mid to lower lobe opacification consistent with aspiration noted on x-ray along with decreased but still adequate oxygenation.    P:   -continue working on aspiration prevention, tube feeds on hold -continue cefepime -Full vent support, VAP ppx, sedation off neuro status prohibits SBT  INFECTIOUS A:   Aspiration event with pneumonitis, high risk for subsequent PNA P:   -continue empiric treatment with cefepime, wbc normalized  CARDIOVASCULAR A:  Cardiac arrest: likely cardiogenic shock, may have had some stunning from cocaine vs prolonged arrhythmia,  ECHO delayed and shows recovery of EF, no RWMA seen consistent with his now minimally elevated troponin.  Off all vasopressors on 1/4 normotensive  P:  -close electrolyte monitoring and replacement -support with maintenance fluids today given vomiting and difficulty with tube feeding -continuous cardiac monitoring  HEMATOLOGIC A:   Mild normocytic anemia P:  -continue to monitor  GASTROINTESTINAL A:   Nutrition: currently intubated and sedated Vomiting episodes: two vomiting events so far one resulting in aspiration P:   -continue reglan and suppositories, bowel regimen -continue protonix  FAMILY  - Updates: Calling and updating family today  - Inter-disciplinary family meet or Palliative Care meeting due by:  day 7  Pulmonary and [place] Pager: [phone] Vickki Muff MD PGY-3 Internal Medicine Pager # [phone]   07/05/2019, 8:49 AM

## 2019-07-05 NOTE — Progress Notes (Signed)
RT transported pt to and from CT without complications ? ?

## 2019-07-05 NOTE — Consult Note (Addendum)
Neurology Consultation  Reason for Consult: Lack of movement in bilateral lower and upper extremities Referring Physician: Ina Homes  CC: Lack movement in bilateral lower extremities  History is obtained from: Chart  HPI: Pedro Gomez is a 39 y.o. male with past medical history of paroxysmal A. fib who had a history of being on Xarelto for approximately 1 week in June but did not follow-up since 2019, small PFO, active smoker and marijuana and polysubstance abuse with witnessed cardiac arrest-approximate downtime 10 minutes.  Patient had CPR and shockable rhythm in the field by EMS.  Patient did receive targeted temperature management for neuro protective effect.  Patient has been rewarmed since 07/02/2019.  Propofol was turned off today and it was noted that patient was not moving his upper or lower extremities.  Neurology was consulted for this reason.   Chart review -none  Work up that has been done: EEG-07/01/2019-patient was on fentanyl, propofol-at that time was characterized by discontinuous rhythm that showed no evidence of reactivity.  These findings may be seen in variety of circumstances including anesthesia, drug intoxication, hypothermia as well as diffuse cerebral injury.  No epileptiform activity was noted. Later that evening neurologist was called for for shivering versus seizure activity.  LTM was evaluated did not show any seizure activity.  EEG 07/02/2019-at that time patient was on propofol-again this showed profound diffuse encephalopathy, no seizure or epileptiform discharges were seen.  EEG on 07/03/2019 still on propofol, continue to show profound diffuse encephalopathy with no seizures or epileptiform discharges.  Event button was pushed at one time however EEG did not show any changes suggestive of seizure and was likely due to shivering.  On 07/04/2019 again shows no seizures or epileptiform discharges on recording.  CT head on 06/30/2019 showed no CT evidence for acute  intracranial abnormality. MRI brain obtained on 07/05/2019 showed normal MRI brain  Urine drug screen positive for barbiturates and cocaine  AST-5 days ago 66, 4 days ago 80, 2 days ago 41 ALT-5 days ago 66, 4 days ago 76, 2 days ago 41  Past Medical History:  Diagnosis Date  . Atrial fibrillation ([facility])   . Polysubstance abuse ([place])   . Tobacco abuse     Family History  Problem Relation Age of Onset  . Hypertension Mother   . Hypertension Father    Social History:   has an unknown smoking status. He has never used smokeless tobacco. He reports current drug use. Drug: Marijuana. No history on file for alcohol.  Medications  Current Facility-Administered Medications:  .  0.9 %  sodium chloride infusion, , Intravenous, Continuous, Gleason, Otilio Carpen, PA-C, Stopped at 07/05/19 0252 .  0.9 %  sodium chloride infusion, 250 mL, Intravenous, Continuous, Anders Simmonds, MD, Last Rate: 10 mL/hr at 07/01/19 0300, Restarted at 07/01/19 0300 .  acetaminophen (TYLENOL) tablet 650 mg, 650 mg, Oral, Q6H, Candee Furbish, MD, 650 mg at 07/05/19 1041 .  aspirin tablet 325 mg, 325 mg, Per Tube, Daily, Ronna Polio, RPH, 325 mg at 07/05/19 1041 .  bisacodyl (DULCOLAX) suppository 10 mg, 10 mg, Rectal, Daily PRN, Guadlupe Spanish B, MD .  ceFEPIme (MAXIPIME) 2 g in sodium chloride 0.9 % 100 mL IVPB, 2 g, Intravenous, Q8H, Candee Furbish, MD, Last Rate: 200 mL/hr at 07/05/19 0526, 2 g at 07/05/19 0526 .  chlorhexidine gluconate (MEDLINE KIT) (PERIDEX) 0.12 % solution 15 mL, 15 mL, Mouth Rinse, BID, Scatliffe, Kristen D, MD, 15 mL at 07/05/19 0800 .  Chlorhexidine Gluconate Cloth 2 % PADS 6 each, 6 each, Topical, Daily, Scatliffe, Rise Paganini, MD, 6 each at 07/05/19 1050 .  dextrose 5 % solution, , Intravenous, Continuous, Katherine Roan, MD, Last Rate: 75 mL/hr at 07/05/19 1051, New Bag at 07/05/19 1051 .  feeding supplement (PRO-STAT SUGAR FREE 64) liquid 30 mL, 30 mL, Per Tube, TID, Candee Furbish, MD, 30 mL at 07/05/19 1041 .  feeding supplement (VITAL 1.5 CAL) liquid 1,000 mL, 1,000 mL, Per Tube, Continuous, Candee Furbish, MD .  fentaNYL (SUBLIMAZE) injection 25-50 mcg, 25-50 mcg, Intravenous, Q4H PRN, Anders Simmonds, MD, 50 mcg at 07/05/19 1041 .  folic acid injection 1 mg, 1 mg, Intravenous, Daily, Gleason, Otilio Carpen, PA-C, 1 mg at 07/04/19 1059 .  heparin injection 5,000 Units, 5,000 Units, Subcutaneous, Q8H, Gleason, Otilio Carpen, PA-C, 5,000 Units at 07/05/19 0517 .  MEDLINE mouth rinse, 15 mL, Mouth Rinse, 10 times per day, Scatliffe, Kristen D, MD, 15 mL at 07/05/19 1000 .  metoCLOPramide (REGLAN) injection 10 mg, 10 mg, Intravenous, Q6H, Winfrey, William B, MD, 10 mg at 07/05/19 1100 .  metoprolol tartrate (LOPRESSOR) injection 5 mg, 5 mg, Intravenous, Q6H PRN, Audria Nine, DO, 5 mg at 07/04/19 1957 .  nicotine (NICODERM CQ - dosed in mg/24 hours) patch 14 mg, 14 mg, Transdermal, Daily, Gleason, Otilio Carpen, PA-C, 14 mg at 07/05/19 1052 .  norepinephrine (LEVOPHED) 57m in 2554mpremix infusion, 0-40 mcg/min, Intravenous, Titrated, ElMargaretha SeedsMD, Stopped at 07/03/19 [id number]  [START ON 07/06/2019] ondansetron (ZOFRAN) injection 4 mg, 4 mg, Intravenous, Q6H PRN, WiKatherine RoanMD .  pantoprazole (PROTONIX) injection 40 mg, 40 mg, Intravenous, Q24H, Gleason, LaOtilio CarpenPA-C, 40 mg at 07/05/19 0000 .  polyethylene glycol (MIRALAX / GLYCOLAX) packet 17 g, 17 g, Per Tube, Daily, WiKatherine RoanMD, 17 g at 07/05/19 1041 .  sodium chloride flush (NS) 0.9 % injection 10-40 mL, 10-40 mL, Intracatheter, Q12H, ElMargaretha SeedsMD, 10 mL at 07/04/19 2256 .  sodium chloride flush (NS) 0.9 % injection 10-40 mL, 10-40 mL, Intracatheter, PRN, ElMargaretha SeedsMD .  thiamine (B-1) injection 100 mg, 100 mg, Intravenous, Daily, Gleason, LaOtilio CarpenPA-C, 100 mg at 07/04/19 0900  ROS:   Unable to obtain due to intubation.   Exam: Current vital signs: BP 125/85   Pulse (!) 103    Temp (!) 100.8 F (38.2 C)   Resp (!) 28   Ht _0  (1.905 m)   Wt 83.5 kg   SpO2 100%   BMI 23.01 kg/m  Vital signs in last 24 hours: Temp:  [99.1 F (37.3 C)-101.3 F (38.5 C)] 100.8 F (38.2 C) (01/06 1000) Pulse Rate:  [40-149] 103 (01/06 1000) Resp:  [11-40] 28 (01/06 1000) BP: (108-160)/(69-104) 125/85 (01/06 1000) SpO2:  [86 %-100 %] 100 % (01/06 1000) FiO2 (%):  [60 %-100 %] 60 % (01/06 0901) Weight:  [83.5 kg] 83.5 kg (01/06 0438)   Constitutional: Appears well-developed and well-nourished.  Eyes: No scleral injection HENT: No OP obstrucion Head: Normocephalic.  Cardiovascular: Elevated rate and regular rhythm.  Respiratory: Breathing over the ventilator GI: Soft.  No distension.   Skin: WDI  Neuro: Mental Status: Patient is currently intubated, does not nod any questions, will track my emotions in the room.  Currently breathing over the ventilator Cranial Nerves: II: Did not observe any blink to threat III,IV, VI: EOMI without ptosis or diploplia. Pupils equal, round and reactive  to light V: Significantly winces to pain and begins gagging on tube VII: Difficult to ascertain as he is intubated VIII: Does not follow commands to vocality however nurse states that he did previously that day  Motor: Able to move head and neck left and right.  No spontaneous movement of bilateral arms or legs and no movement to noxious stimuli ( PLEASE SEE ADDENDUM) Sensory: No movement to noxious stimuli bilateral arms and legs Deep Tendon Reflexes: 2+ and symmetric in the biceps and patellae.  With cross adductors Plantars: Toes are downgoing bilaterally.  Cerebellar: Unable to obtain secondary to all extremities flaccid   Labs I have reviewed labs in epic and the results pertinent to this consultation are:   CBC    Component Value Date/Time   WBC 9.0 07/05/2019 0401   RBC 3.63 (L) 07/05/2019 0401   HGB 11.2 (L) 07/05/2019 0401   HCT 33.2 (L) 07/05/2019 0401    PLT 165 07/05/2019 0401   MCV 91.5 07/05/2019 0401   MCH 30.9 07/05/2019 0401   MCHC 33.7 07/05/2019 0401   RDW 12.2 07/05/2019 0401   LYMPHSABS 4.7 (H) 06/30/2019 2150   MONOABS 0.4 06/30/2019 2150   EOSABS 0.3 06/30/2019 2150   BASOSABS 0.0 06/30/2019 2150    CMP     Component Value Date/Time   NA 137 07/05/2019 0401   K 3.7 07/05/2019 0401   CL 101 07/05/2019 0401   CO2 26 07/05/2019 0401   GLUCOSE 113 (H) 07/05/2019 0401   BUN 23 (H) 07/05/2019 0401   CREATININE 0.95 07/05/2019 0401   CALCIUM 8.5 (L) 07/05/2019 0401   PROT 5.5 (L) 07/03/2019 1008   ALBUMIN 2.9 (L) 07/03/2019 1008   AST 41 07/03/2019 1008   ALT 41 07/03/2019 1008   ALKPHOS 63 07/03/2019 1008   BILITOT 0.8 07/03/2019 1008   GFRNONAA >60 07/05/2019 0401   GFRAA >60 07/05/2019 0401    Lipid Panel     Component Value Date/Time   CHOL 141 07/01/2019 0248   TRIG 79 07/04/2019 0204   HDL 53 07/01/2019 0248   CHOLHDL 2.7 07/01/2019 0248   VLDL 7 07/01/2019 0248   LDLCALC 81 07/01/2019 0248     Imaging I have reviewed the images obtained:  CT-scan of the brain-no CT evidence of acute intracranial abnormality  MRI examination of the brain-normal MRI brain  Etta Quill PA-C [facility] [phone]  M-F  (9:00 am- 5:00 PM)  07/05/2019, 11:34 AM     Assessment:  39 year old male presented to the hospital after approximately 10 minutes of witnessed cardiac arrest.  CPR and shockable rhythm in the field with ROSC.  Patient underwent TTM and was back to normothermic on 1 08/2019.  When propofol was turned off it was noted that patient was unable to move bilateral upper and lower extremities.  Neurology was consulted for evaluation.  At this point in time concern is for possible cord infarct.  Impression: -Bilateral upper and lower extremity plegia -Aspiration pneumonia -Cardiac arrest -Initial shock liver however AST and ALT's have returned to normal limits -Possible spinal  infarct  Recommendations: -MRI of cervical spine and thoracic spine to further evaluate for possible spinal infarct -We will make further recommendations after MRI   NEUROHOSPITALIST ADDENDUM Performed a face to face diagnostic evaluation.   I have reviewed the contents of history and physical exam as documented by PA/ARNP/Resident and agree with above documentation.  I have discussed and formulated the above plan as documented. Edits to the  note have been made as needed.  Neurology was consulted after 39 year old male with Vfib arrest, ROSC approximately 10 minutes, is not moving his arms or legs after weaning off sedation. Patient's propofol was weaned off yesterday.  On my assessment patient is awake, not following any commands.  Does track examiner and fixes gaze when attempting to talk. On examination, he does withdraw to bilateral upper extremities as well as withdrawal antigravity to bilateral lower extremities... No spontaneous movement seen.  Sensation appears intact to least noxious stimulus. MRI brain is unremarkable.  EEG obtained on 1/3 shows generalized slowing, no epileptiform discharges were seen.  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  Also patient experiences pain to noxious stimulus.  Patient is also encephalopathic and not following commands appropriately-  so not typical for spinal cord infarct as this would not cause altered mental status.  However, I do think it is prudent to get a MRI of the C and T-spine as there may be 2 processes going on. -Continue to wean sedation. -Check ammonia -See if cefepime can be replaced with alternative antibiotic -Agree with thiamine   We will continue to follow.    Karena Addison Keera Altidor MD [facility] [phone]   If 7pm to 7am, please call on call as listed on AMION.

## 2019-07-05 NOTE — Progress Notes (Signed)
Nutrition Follow up  DOCUMENTATION CODES:   Not applicable  INTERVENTION:   Recommend Cortrak placement Friday if unable to extubate as pt has been unable to tolerate TF.   Tube feeding:  -Vital 1.5 @ 20 ml/hr via OGT  -Increase per toleration to goal rate of 60 ml/hr (1440 ml) -30 ml Prostat TID  Provides: 2460 kcals, 142 grams protein, 1100 ml free water.   NUTRITION DIAGNOSIS:   Inadequate oral intake related to inability to eat as evidenced by NPO status.  Ongoing  GOAL:   Patient will meet greater than or equal to 90% of their needs   Addressed via TF  MONITOR:   Vent status, Labs, I & O's  REASON FOR ASSESSMENT:   Ventilator    ASSESSMENT:   39 year old male presented with witnessed cardiac arrest. Admitted to Cardiac unit on normothermia protocol. PMH includes paroxysmal atrial fibrillation, polysubstance abuse.  Pt discussed during ICU rounds and with RN.   Mental statues precludes extubation. Off pressors. Continues to have vomiting episodes. RN suspects it's from anxiety regarding ETT as he has coughing spells. TF turned off. Recommend starting trickles post imaging. If unable to extubate before weekend, recommend placement of Cortrak. Day 4 without BM.   Admission weight: 83.9 kg  Current weight: 83.5 kg   Patient remains intubated on ventilator support MV: 16.5 L/min Temp (24hrs), Avg:100.7 F (38.2 C), Min:99.9 F (37.7 C), Max:101.3 F (38.5 C)   I/O: +3,208 ml since admit UOP: 1,960 ml x 24 hrs OGT: 1,000 ml x 24 hrs   Drips: precedex, D5 @ 75 ml/hr, potassium phosphate  Medications: folic acid, reglan, miralax, thiamine, reglan BID Labs: Phosphorus 1.9 (L) CBG 103->600 (?question validity)   Diet Order:   Diet Order    None      EDUCATION NEEDS:   Not appropriate for education at this time  Skin:  Skin Assessment: Reviewed RN Assessment  Last BM:  PTA  Height:   Ht Readings from Last 1 Encounters:  07/02/19 6\' 3"  (1.905  m)    Weight:   Wt Readings from Last 1 Encounters:  07/05/19 83.5 kg    BMI:  Body mass index is 23.01 kg/m.  Estimated Nutritional Needs:   Kcal:  2446 kcal  Protein:  135-150 grams  Fluid:  >/= 2.4 L/day   09/02/19 RD, LDN Clinical Nutrition Pager # - [phone]

## 2019-07-05 NOTE — Progress Notes (Signed)
eLink Physician-Brief Progress Note Patient Name: Pedro Gomez DOB: 03/09/1981 MRN: [id number]   Date of Service  07/05/2019  HPI/Events of Note  Agitation - Wakes up gagging and biting on ETT. Sedation being held to assess neurological status.   eICU Interventions  Will order: 1. Fentanyl 25-50 mcg IV Q 4 hours PRN agitation.      Intervention Category Major Interventions: Delirium, psychosis, severe agitation - evaluation and management  Ethelyn Cerniglia Eugene 07/05/2019, 12:21 AM

## 2019-07-06 ENCOUNTER — Inpatient Hospital Stay (HOSPITAL_COMMUNITY): Payer: Self-pay

## 2019-07-06 LAB — BASIC METABOLIC PANEL
Anion gap: 8 (ref 5–15)
BUN: 22 mg/dL — ABNORMAL HIGH (ref 6–20)
CO2: 30 mmol/L (ref 22–32)
Calcium: 8.3 mg/dL — ABNORMAL LOW (ref 8.9–10.3)
Chloride: 99 mmol/L (ref 98–111)
Creatinine, Ser: 1.01 mg/dL (ref 0.61–1.24)
GFR calc Af Amer: >60 mL/min (ref >60)
GFR calc non Af Amer: >60 mL/min (ref >60)
Glucose, Bld: 110 mg/dL — ABNORMAL HIGH (ref 70–99)
Potassium: 3.2 mmol/L — ABNORMAL LOW (ref 3.5–5.1)
Sodium: 137 mmol/L (ref 135–145)

## 2019-07-06 LAB — GLUCOSE, CAPILLARY
Glucose-Capillary: 102 mg/dL — ABNORMAL HIGH (ref 70–99)
Glucose-Capillary: 107 mg/dL — ABNORMAL HIGH (ref 70–99)
Glucose-Capillary: 107 mg/dL — ABNORMAL HIGH (ref 70–99)
Glucose-Capillary: 108 mg/dL — ABNORMAL HIGH (ref 70–99)
Glucose-Capillary: 108 mg/dL — ABNORMAL HIGH (ref 70–99)
Glucose-Capillary: 113 mg/dL — ABNORMAL HIGH (ref 70–99)
Glucose-Capillary: 117 mg/dL — ABNORMAL HIGH (ref 70–99)

## 2019-07-06 LAB — CBC
HCT: 31.9 % — ABNORMAL LOW (ref 39.0–52.0)
Hemoglobin: 11.1 g/dL — ABNORMAL LOW (ref 13.0–17.0)
MCH: 31.3 pg (ref 26.0–34.0)
MCHC: 34.8 g/dL (ref 30.0–36.0)
MCV: 89.9 fL (ref 80.0–100.0)
Platelets: 163 10*3/uL (ref 150–400)
RBC: 3.55 MIL/uL — ABNORMAL LOW (ref 4.22–5.81)
RDW: 12.3 % (ref 11.5–15.5)
WBC: 9.8 10*3/uL (ref 4.0–10.5)
nRBC: 0 % (ref 0.0–0.2)

## 2019-07-06 LAB — CULTURE, RESPIRATORY W GRAM STAIN: Culture: NORMAL

## 2019-07-06 LAB — PHOSPHORUS: Phosphorus: 2.8 mg/dL (ref 2.5–4.6)

## 2019-07-06 LAB — CK: Total CK: 633 U/L — ABNORMAL HIGH (ref 49–397)

## 2019-07-06 LAB — MAGNESIUM: Magnesium: 2.2 mg/dL (ref 1.7–2.4)

## 2019-07-06 IMAGING — DX DG ABD PORTABLE 1V
1 series · 1 of 1 positions shown · non-contrast
Comparison: [DATE]

CLINICAL DATA: Cardiac arrest

EXAM:
PORTABLE ABDOMEN - 1 VIEW

[abdomen kub]
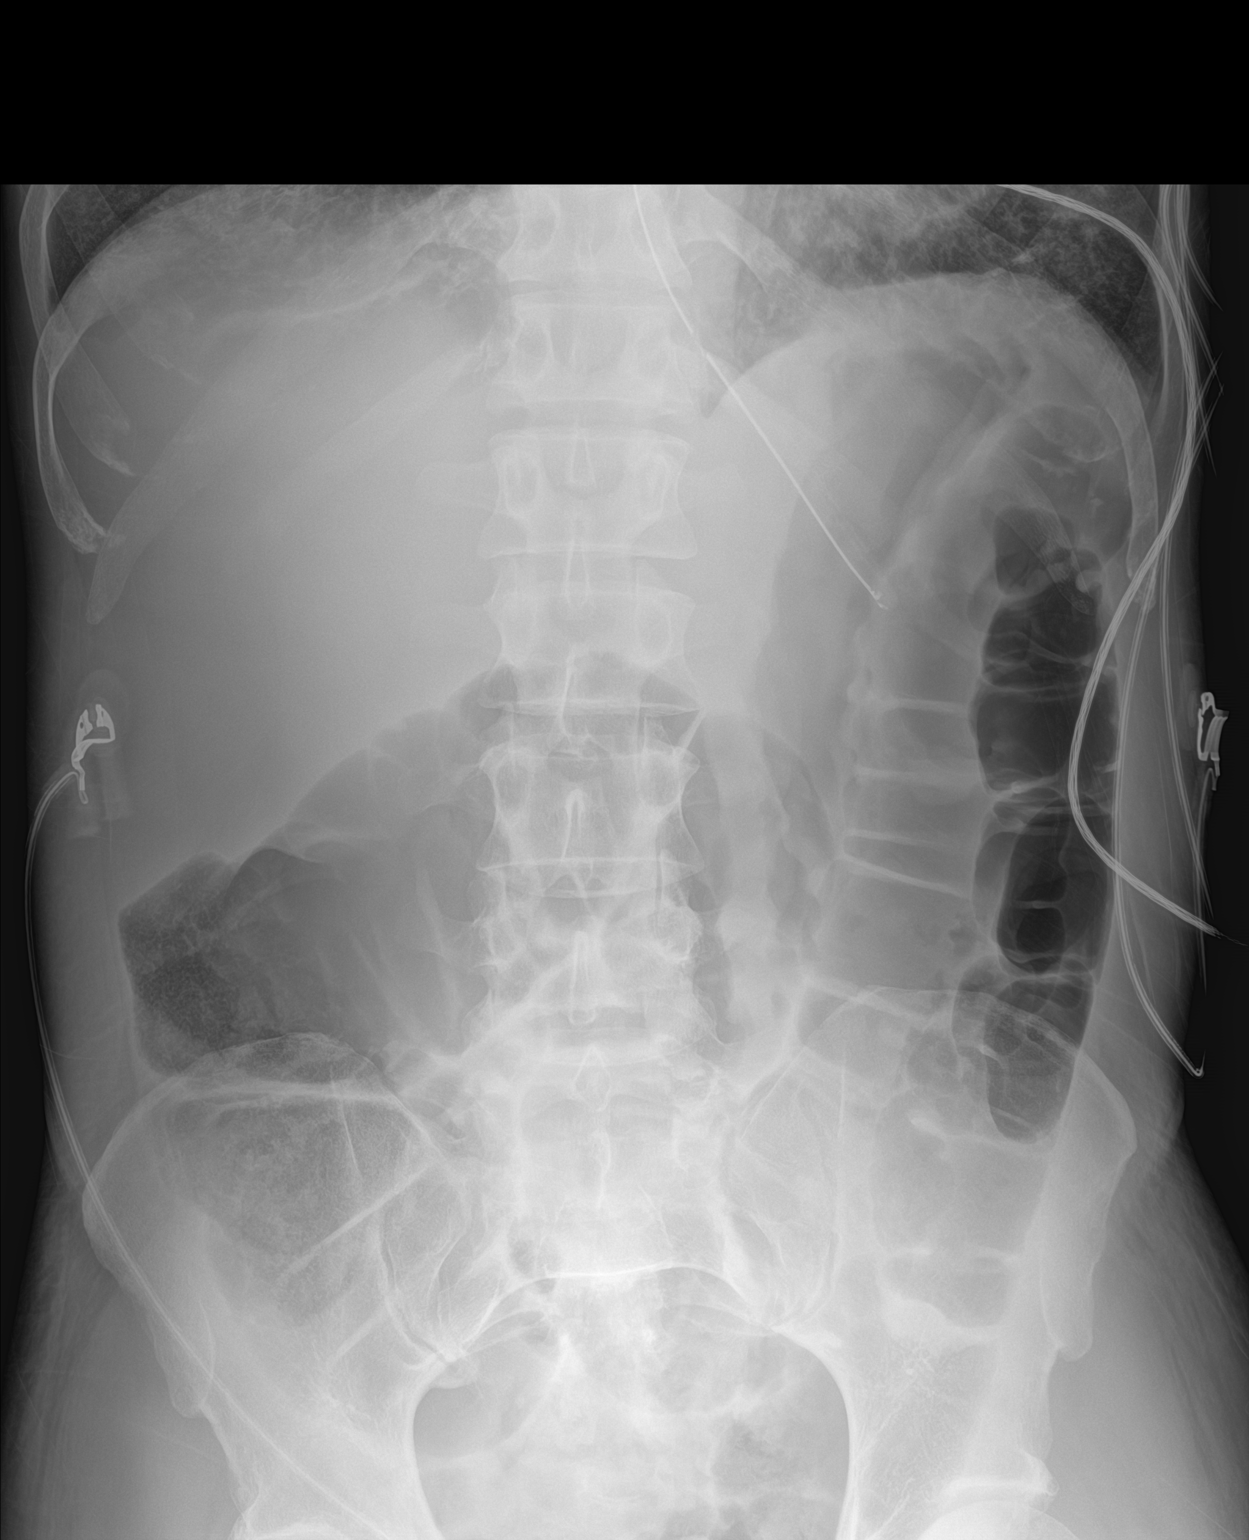

[1 of 1 positions shown; findings below may reference images not displayed]

FINDINGS: NG tube tip is in the proximal stomach with the side port in the
distal esophagus. There is diffuse gaseous distention of bowel, most
notable throughout the colon, likely ileus. This has progressed
since prior study. No visible free air.
IMPRESSION: Increasing gaseous distention of bowel, predominantly colon, likely
ileus.

## 2019-07-06 IMAGING — MR MR THORACIC SPINE WO/W CM
10 of 17 series · 26 of 48 positions shown · IV contrast (Gadavist)
Comparison: None.

CLINICAL DATA: Motor neuron disease after cardiac arrest. Cannot
move extremities

EXAM:
MRI CERVICAL AND THORACIC SPINE WITHOUT AND WITH CONTRAST
TECHNIQUE: Multisequence MR imaging of the cervical and thoracic was performed
prior to and following IV contrast.
CONTRAST:  8mL GADAVIST GADOBUTROL 1 MMOL/ML IV SOLN

[Series 12: T2 · sagittal · 3.0mm · 0.69mm/px · 1 of 15 slices shown (1 of 4)]
[im 1/15]
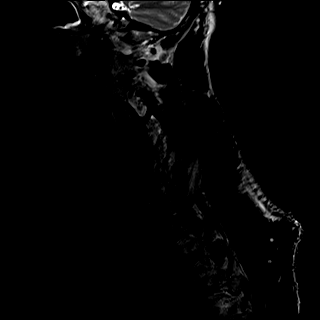

[Series 13: T1 · sagittal · 3.0mm · 0.69mm/px · 2 of 15 slices shown (1 of 5)]
[im 1/15]
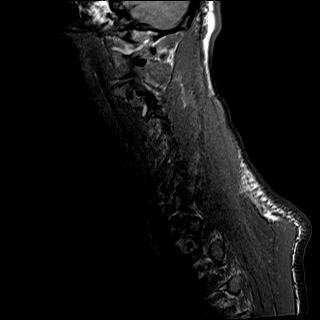
[im 15/15]
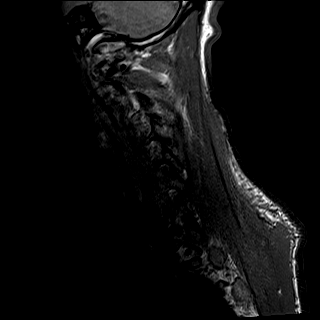

[Series 16: T2 · axial · 3.0mm · 0.66mm/px · z∈[-39,+76]mm · 4 of 37 slices shown (2 of 4)]
[im 1/37]
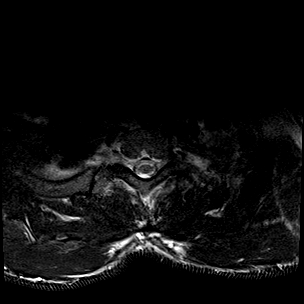
[im 13/37]
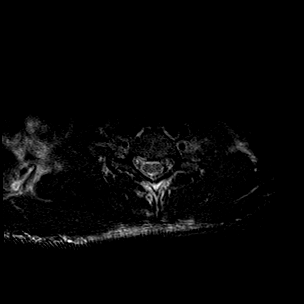
[im 25/37]
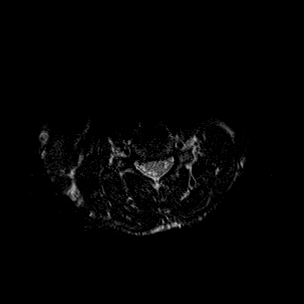
[im 37/37]
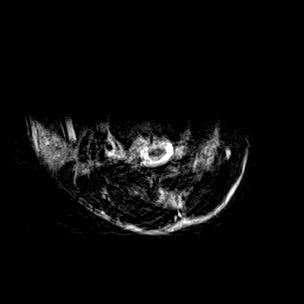

[Series 17: T1 · axial · 3.0mm · 0.39mm/px · z∈[-39,+76]mm · 4 of 37 slices shown (2 of 5)]
[im 1/37]
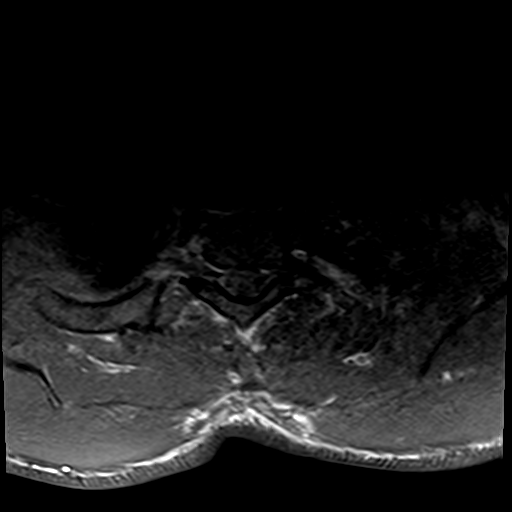
[im 13/37]
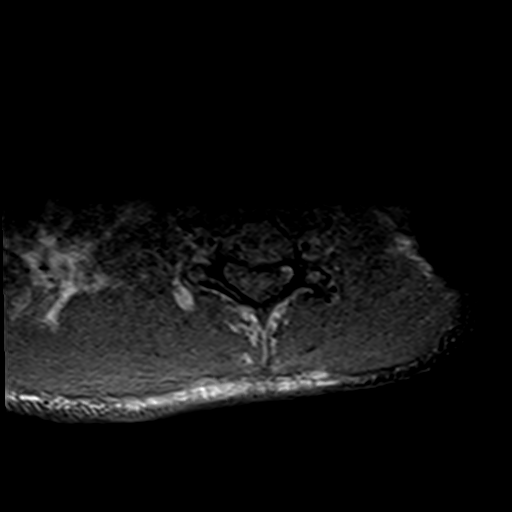
[im 25/37]
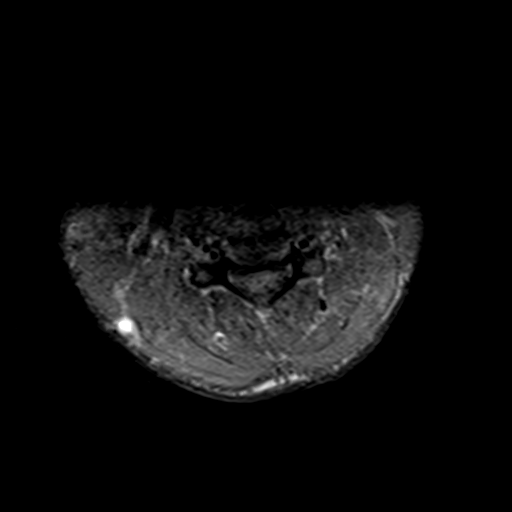
[im 37/37]
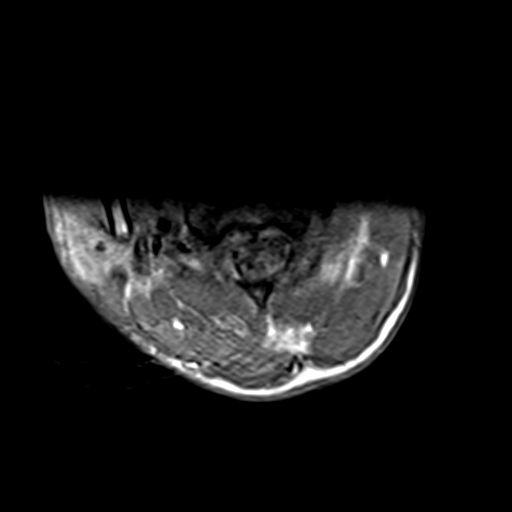

[Series 20: T1 · sagittal · 6.0mm · 1.23mm/px · 1 of 8 slices shown (3 of 5)]
[im 1/8]
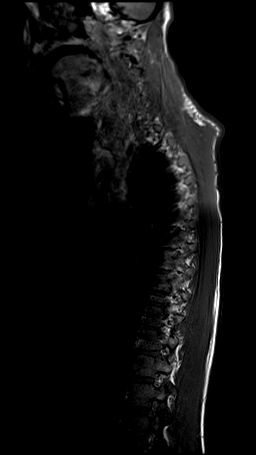

[Series 21: T2 · sagittal · 3.0mm · 0.80mm/px · 2 of 17 slices shown (3 of 4)]
[im 1/17]
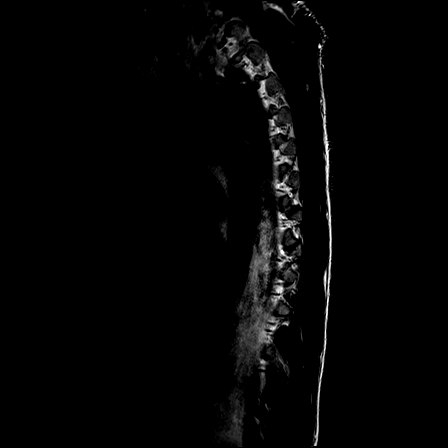
[im 17/17]
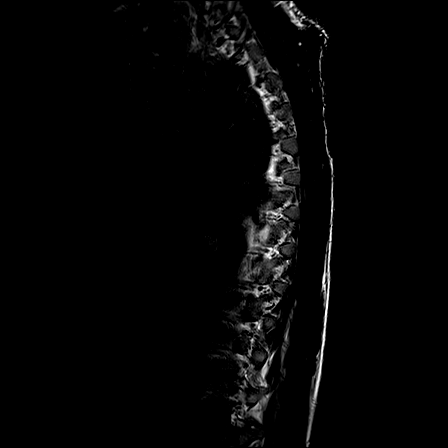

[Series 22: T1 · sagittal · 3.0mm · 0.80mm/px · 2 of 17 slices shown (4 of 5)]
[im 1/17]
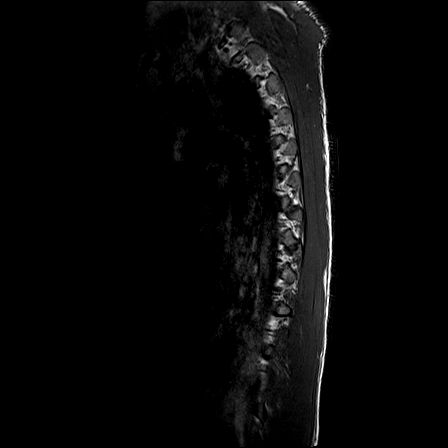
[im 17/17]
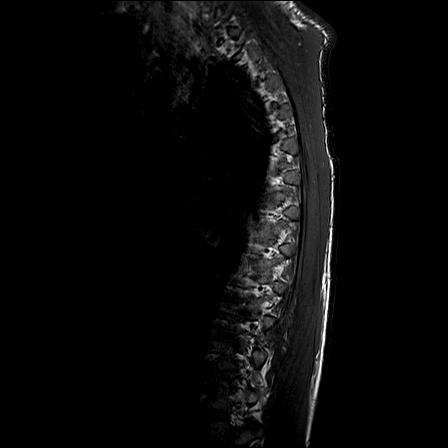

[Series 24: T2 · axial · 4.0mm · 0.59mm/px · z∈[-282,-6]mm · 4 of 39 slices shown (4 of 4)]
[im 1/39]
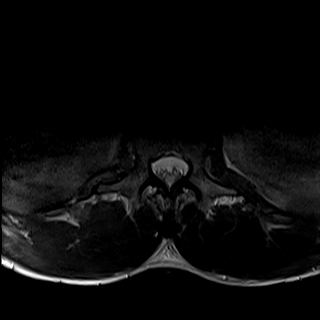
[im 13/39]
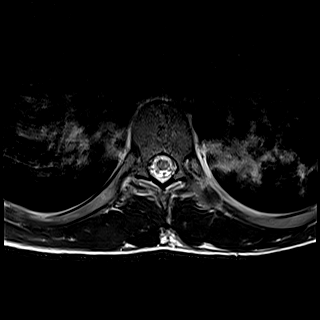
[im 26/39]
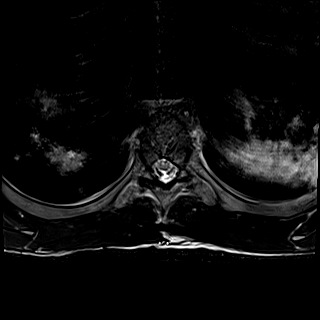
[im 39/39]
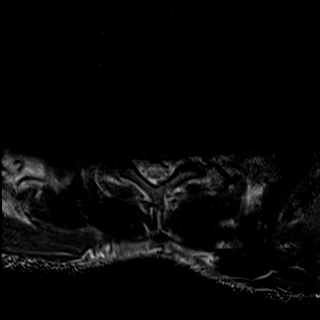

[Series 26: T1 · axial · non-contrast · 4.0mm · 0.31mm/px · z∈[-282,-6]mm · 4 of 39 slices shown (5 of 5)]
[im 1/39]
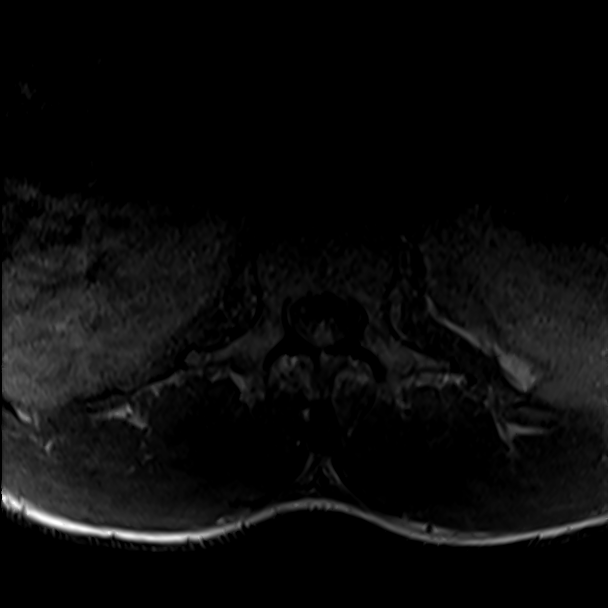
[im 13/39]
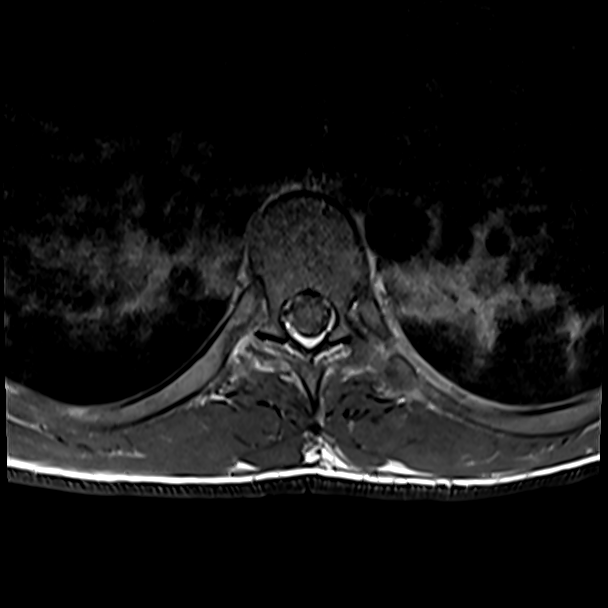
[im 26/39]
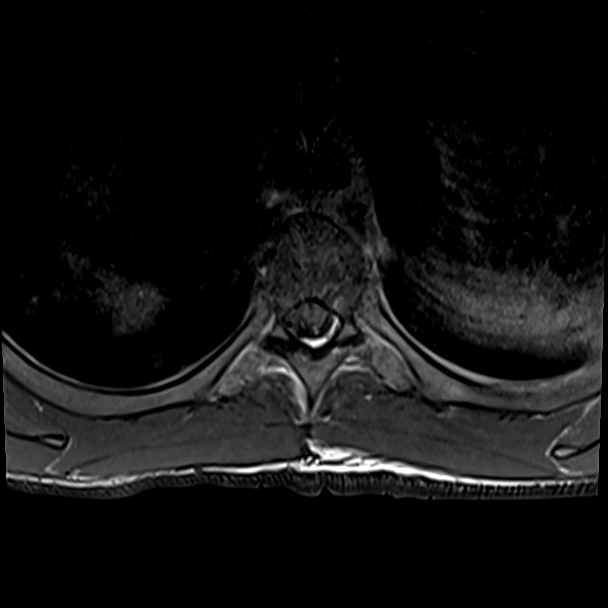
[im 39/39]
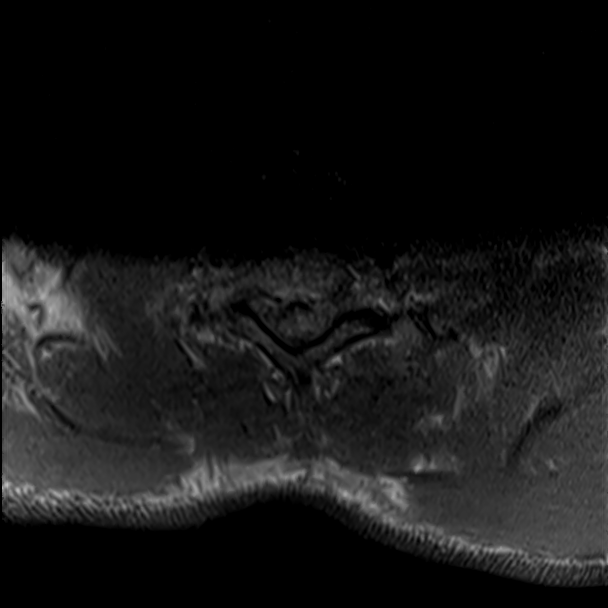

[Series 27: T1 fat-sat post-contrast · sagittal · 3.0mm · 0.43mm/px · 2 of 15 slices shown]
[im 1/15]
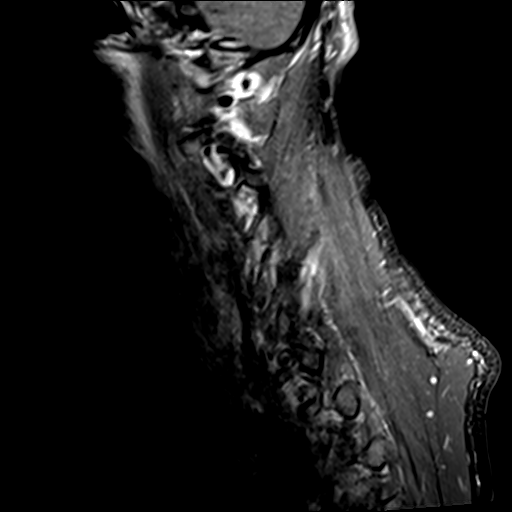
[im 15/15]
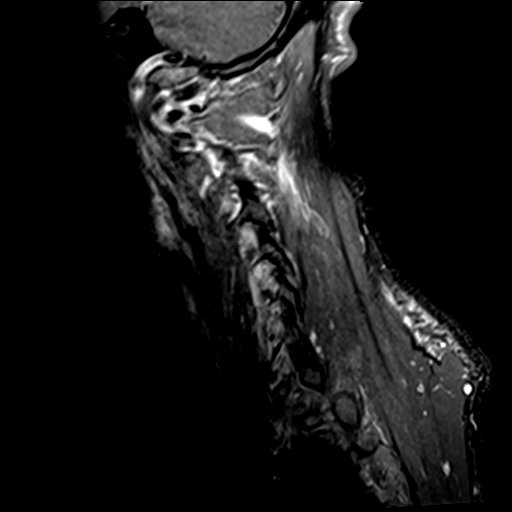

[26 of 48 positions shown; findings below may reference images not displayed]

FINDINGS: MRI CERVICAL SPINE FINDINGS

Alignment: Reversal of cervical lordosis.  No listhesis.

Vertebrae: No fracture, evidence of discitis, or bone lesion.

Cord: Axial images are significantly limited by motion such that
cord evaluation is primarily based on sagittal images. No evidence
of swelling, abnormal enhancement, or signal abnormality.

Posterior Fossa, vertebral arteries, paraspinal tissues: Negative

Disc levels:

C2-3: Unremarkable.

C3-4: Uncovertebral ridging with patent canal and foramina

C4-5: Disc narrowing with bilateral vertebral ridging and foraminal
stenosis that is mild on the right and borderline moderate on the
left.

C5-6: Disc narrowing and bulging with shallow protrusion. The canal
and foramina remain patent

C6-7: Mild right-sided annulus bulging.  No impingement

C7-T1:Unremarkable.

MRI THORACIC SPINE FINDINGS

Alignment:  Normal

Vertebrae: No fracture, evidence of discitis, or bone lesion.

Cord:  Normal signal and morphology.

Paraspinal and other soft tissues: Bilateral airspace disease as
seen on chest x-ray from 3 days ago.

Disc levels:

No significant degenerative changes or impingement.
IMPRESSION: 1. No evidence of myelopathy or other cause of weakness.
2. Extensive bilateral airspace disease.
3. Motion degraded.

## 2019-07-06 IMAGING — MR MR CERVICAL SPINE WO/W CM
10 of 17 series · 26 of 48 positions shown · IV contrast (Gadavist)
Comparison: None.

CLINICAL DATA: Motor neuron disease after cardiac arrest. Cannot
move extremities

EXAM:
MRI CERVICAL AND THORACIC SPINE WITHOUT AND WITH CONTRAST
TECHNIQUE: Multisequence MR imaging of the cervical and thoracic was performed
prior to and following IV contrast.
CONTRAST:  8mL GADAVIST GADOBUTROL 1 MMOL/ML IV SOLN

[Series 12: T2 · sagittal · 3.0mm · 0.69mm/px · 1 of 15 slices shown (1 of 4)]
[im 1/15]
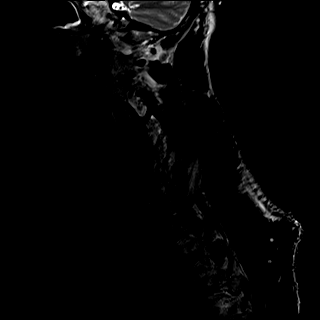

[Series 13: T1 · sagittal · 3.0mm · 0.69mm/px · 2 of 15 slices shown (1 of 5)]
[im 1/15]
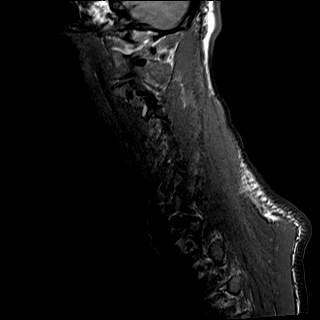
[im 15/15]
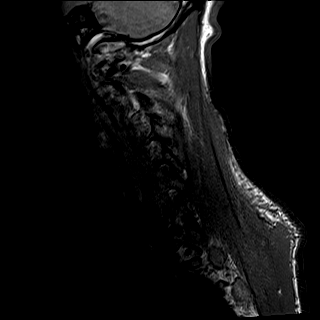

[Series 16: T2 · axial · 3.0mm · 0.66mm/px · z∈[-39,+76]mm · 4 of 37 slices shown (2 of 4)]
[im 1/37]
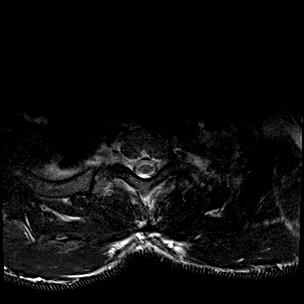
[im 13/37]
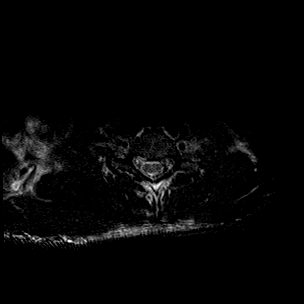
[im 25/37]
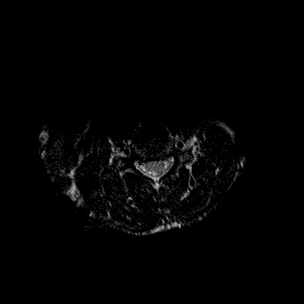
[im 37/37]
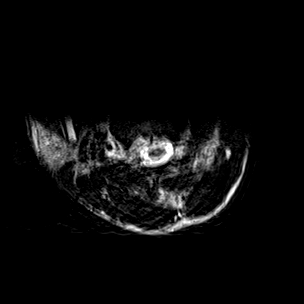

[Series 17: T1 · axial · 3.0mm · 0.39mm/px · z∈[-39,+76]mm · 4 of 37 slices shown (2 of 5)]
[im 1/37]
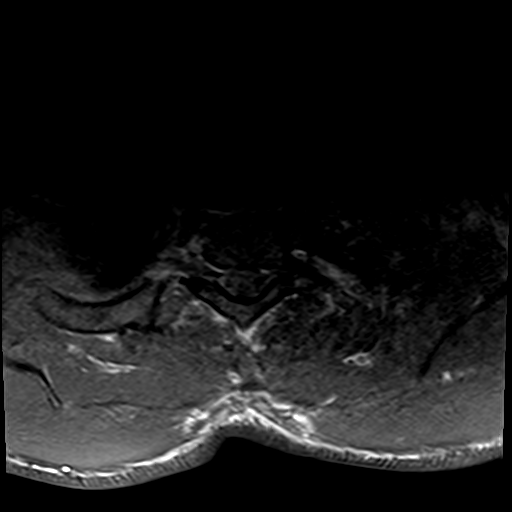
[im 13/37]
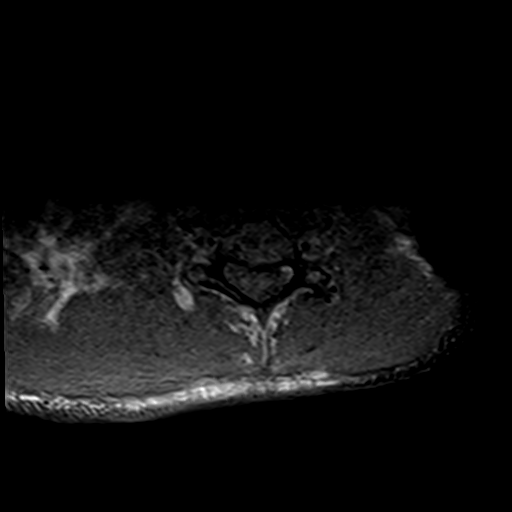
[im 25/37]
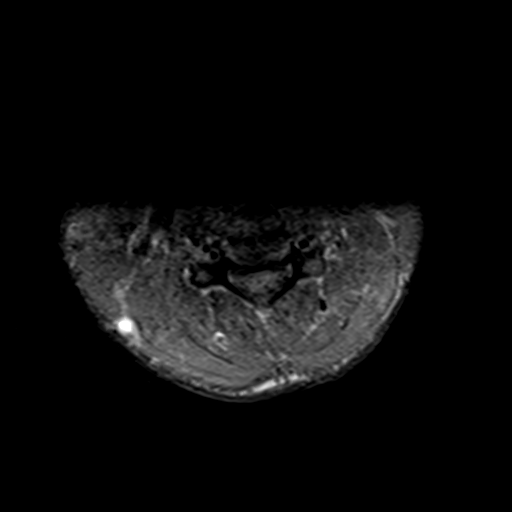
[im 37/37]
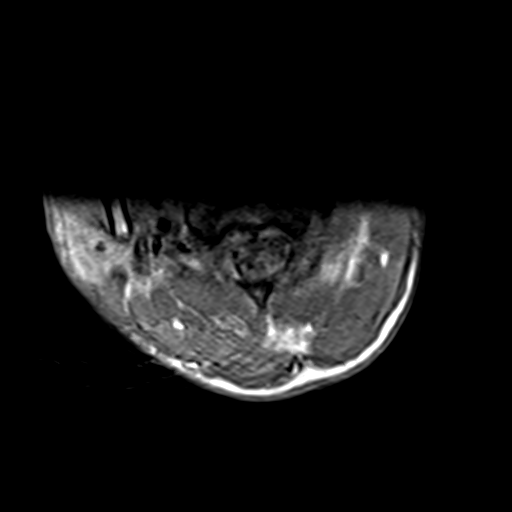

[Series 20: T1 · sagittal · 6.0mm · 1.23mm/px · 1 of 8 slices shown (3 of 5)]
[im 1/8]
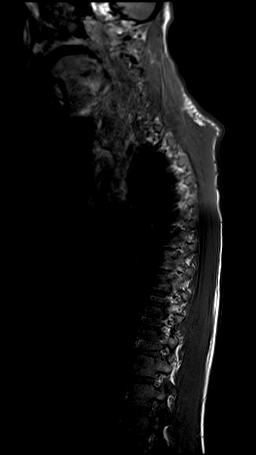

[Series 21: T2 · sagittal · 3.0mm · 0.80mm/px · 2 of 17 slices shown (3 of 4)]
[im 1/17]
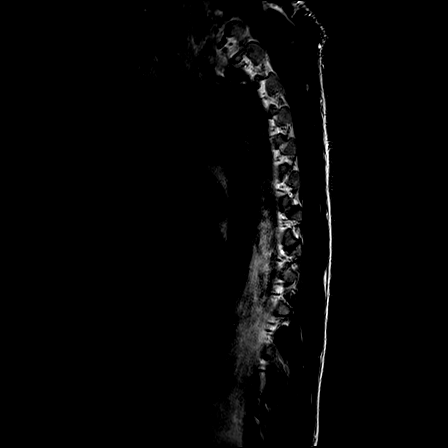
[im 17/17]
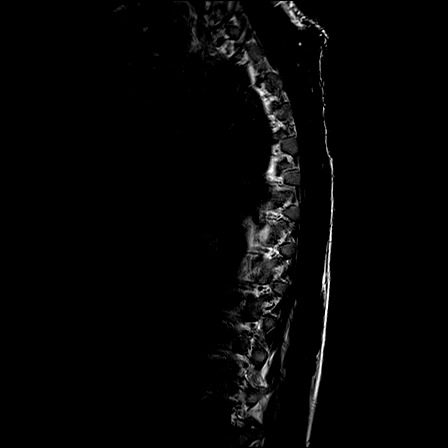

[Series 22: T1 · sagittal · 3.0mm · 0.80mm/px · 2 of 17 slices shown (4 of 5)]
[im 1/17]
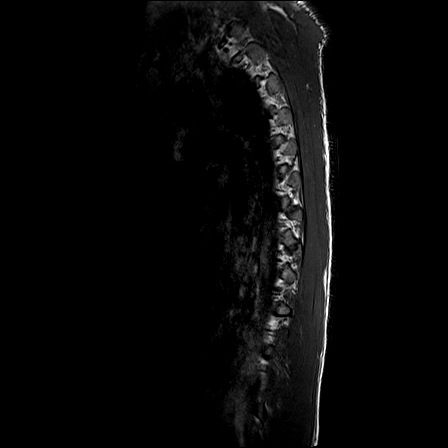
[im 17/17]
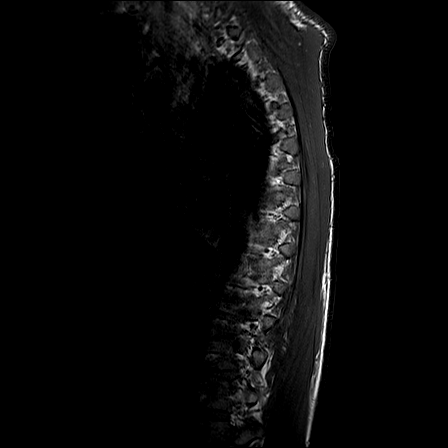

[Series 24: T2 · axial · 4.0mm · 0.59mm/px · z∈[-282,-6]mm · 4 of 39 slices shown (4 of 4)]
[im 1/39]
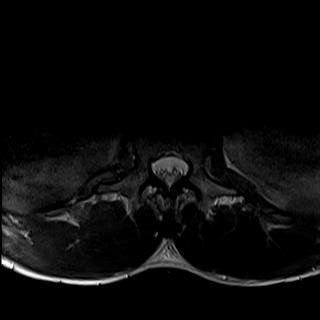
[im 13/39]
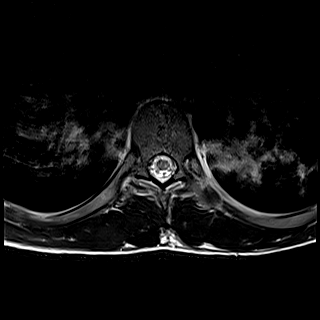
[im 26/39]
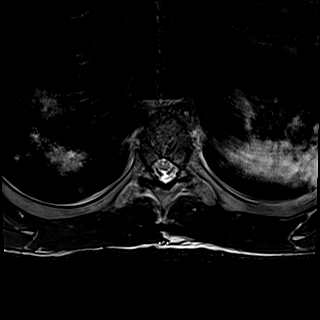
[im 39/39]
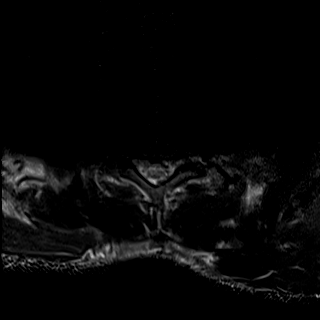

[Series 26: T1 · axial · non-contrast · 4.0mm · 0.31mm/px · z∈[-282,-6]mm · 4 of 39 slices shown (5 of 5)]
[im 1/39]
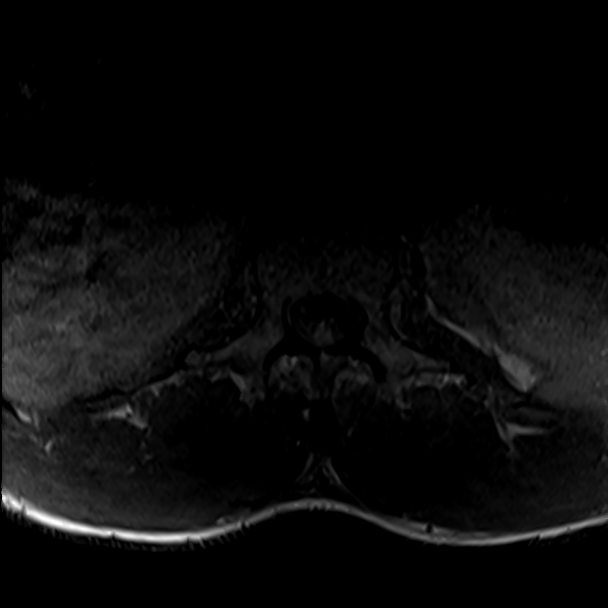
[im 13/39]
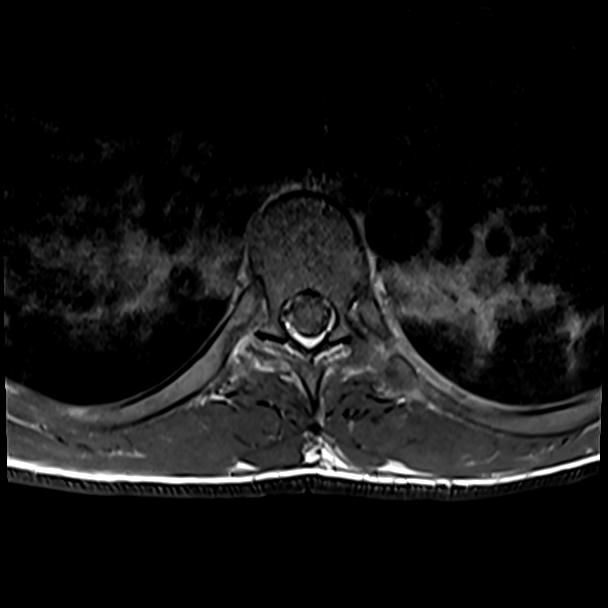
[im 26/39]
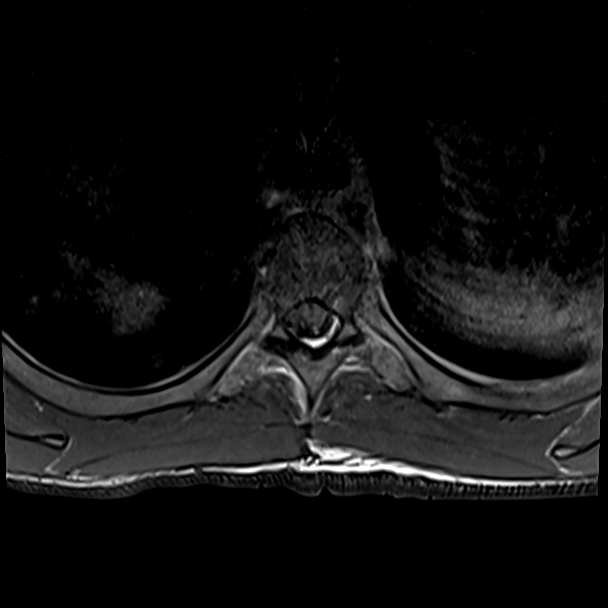
[im 39/39]
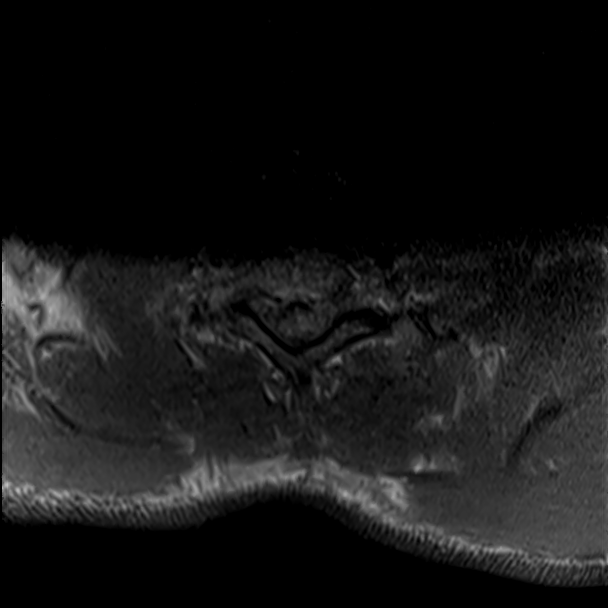

[Series 27: T1 fat-sat post-contrast · sagittal · 3.0mm · 0.43mm/px · 2 of 15 slices shown]
[im 1/15]
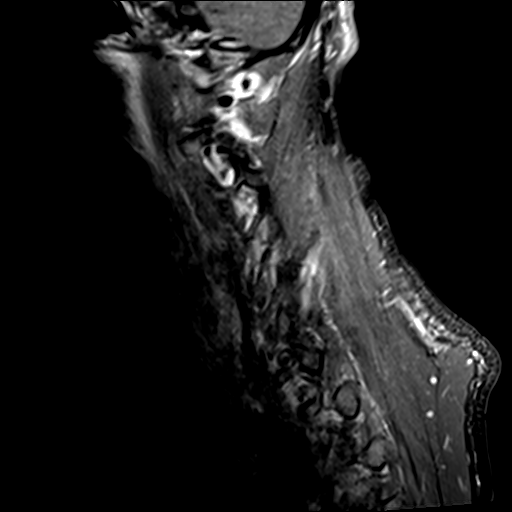
[im 15/15]
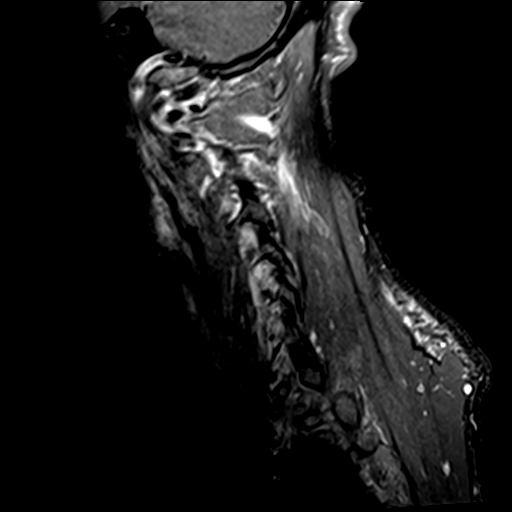

[26 of 48 positions shown; findings below may reference images not displayed]

FINDINGS: MRI CERVICAL SPINE FINDINGS

Alignment: Reversal of cervical lordosis.  No listhesis.

Vertebrae: No fracture, evidence of discitis, or bone lesion.

Cord: Axial images are significantly limited by motion such that
cord evaluation is primarily based on sagittal images. No evidence
of swelling, abnormal enhancement, or signal abnormality.

Posterior Fossa, vertebral arteries, paraspinal tissues: Negative

Disc levels:

C2-3: Unremarkable.

C3-4: Uncovertebral ridging with patent canal and foramina

C4-5: Disc narrowing with bilateral vertebral ridging and foraminal
stenosis that is mild on the right and borderline moderate on the
left.

C5-6: Disc narrowing and bulging with shallow protrusion. The canal
and foramina remain patent

C6-7: Mild right-sided annulus bulging.  No impingement

C7-T1:Unremarkable.

MRI THORACIC SPINE FINDINGS

Alignment:  Normal

Vertebrae: No fracture, evidence of discitis, or bone lesion.

Cord:  Normal signal and morphology.

Paraspinal and other soft tissues: Bilateral airspace disease as
seen on chest x-ray from 3 days ago.

Disc levels:

No significant degenerative changes or impingement.
IMPRESSION: 1. No evidence of myelopathy or other cause of weakness.
2. Extensive bilateral airspace disease.
3. Motion degraded.

## 2019-07-06 MED ORDER — POTASSIUM CHLORIDE 2 MEQ/ML IV SOLN
INTRAVENOUS | Status: DC
  Filled 2019-07-06: qty 1000

## 2019-07-06 MED ORDER — PNEUMOCOCCAL VAC POLYVALENT 25 MCG/0.5ML IJ INJ
0.5000 mL | INJECTION | INTRAMUSCULAR | Status: AC | PRN
  Administered 2019-07-14: 14:00:00 0.5 mL via INTRAMUSCULAR
  Filled 2019-07-06: qty 0.5

## 2019-07-06 MED ORDER — BISACODYL 10 MG RE SUPP
10.0000 mg | Freq: Once | RECTAL | Status: DC
  Filled 2019-07-06: qty 1

## 2019-07-06 MED ORDER — SENNOSIDES 8.8 MG/5ML PO SYRP
10.0000 mL | ORAL_SOLUTION | Freq: Two times a day (BID) | ORAL | Status: DC
  Administered 2019-07-06 – 2019-07-09 (×6): 10 mL via ORAL
  Filled 2019-07-06 (×8): qty 10

## 2019-07-06 MED ORDER — INFLUENZA VAC SPLIT QUAD 0.5 ML IM SUSY
0.5000 mL | PREFILLED_SYRINGE | INTRAMUSCULAR | Status: AC | PRN
  Administered 2019-07-14: 0.5 mL via INTRAMUSCULAR
  Filled 2019-07-06: qty 0.5

## 2019-07-06 MED ORDER — METOCLOPRAMIDE HCL 5 MG/ML IJ SOLN
10.0000 mg | Freq: Four times a day (QID) | INTRAMUSCULAR | Status: AC
  Administered 2019-07-06 (×3): 10 mg via INTRAVENOUS
  Filled 2019-07-06 (×3): qty 2

## 2019-07-06 MED ORDER — ENOXAPARIN SODIUM 40 MG/0.4ML ~~LOC~~ SOLN
40.0000 mg | SUBCUTANEOUS | Status: DC
  Administered 2019-07-06 – 2019-07-13 (×8): 40 mg via SUBCUTANEOUS
  Filled 2019-07-06 (×8): qty 0.4

## 2019-07-06 MED ORDER — GADOBUTROL 1 MMOL/ML IV SOLN
8.0000 mL | Freq: Once | INTRAVENOUS | Status: AC | PRN
  Administered 2019-07-06: 8 mL via INTRAVENOUS

## 2019-07-06 MED ORDER — POTASSIUM CHLORIDE 2 MEQ/ML IV SOLN
INTRAVENOUS | Status: AC
  Filled 2019-07-06 (×2): qty 1000

## 2019-07-06 MED ORDER — ACETAMINOPHEN 160 MG/5ML PO SOLN
650.0000 mg | Freq: Four times a day (QID) | ORAL | Status: DC
  Administered 2019-07-06 – 2019-07-10 (×15): 650 mg
  Filled 2019-07-06 (×17): qty 20.3

## 2019-07-06 MED ORDER — POTASSIUM CHLORIDE 2 MEQ/ML IV SOLN: INTRAVENOUS | Status: DC

## 2019-07-06 NOTE — Progress Notes (Addendum)
NEUROLOGY PROGRESS NOTE  Subjective: Slow thought process but able to shake his head that he is in no pain.  Exam: Vitals:   07/06/19 0800 07/06/19 0825  BP: 107/78   Pulse: 79   Resp: (!) 24   Temp: 98.8 F (37.1 C)   SpO2: 99% 95%    ROS Unable to obtain secondary to intubation and slow thought process with minimal answering of questions   Physical Exam  Constitutional: Appears well-developed and well-nourished.  Eyes: No scleral injection HENT: No OP obstrucion Head: Normocephalic.  Cardiovascular: Normal rate and regular rhythm.  Respiratory: Breathing over the ventilator GI: Soft.  No distension. There is no tenderness.  Skin: WDI   Neuro:  Mental Status: Alert, answers questions with shaking his head.  Slow thought process when asked questions Cranial Nerves: II: Intact bilaterally.  Attempts to mouth numbers with ET tube in place.  Answers the correct answer III,IV, VI: ptosis not present, extra-ocular motions intact bilaterally pupils equal, round, reactive to light and accommodation V,VII: Intubated and hard to assess, facial light touch sensation normal bilaterally VIII: hearing normal bilaterally Motor: No movement of bilateral upper or lower extremities today, no spontaneous and or to noxious stimuli.  On my assessment he does not withdraw from pain Sensory: Patient nods his head yes to noxious stimuli throughout upper and lower extremities Deep Tendon Reflexes: 2+ brachioradialis but no knee jerk or ankle jerk Plantars: Mute bilaterally   Medications:  Scheduled: . acetaminophen  650 mg Oral Q6H  . aspirin  325 mg Per Tube Daily  . chlorhexidine gluconate (MEDLINE KIT)  15 mL Mouth Rinse BID  . Chlorhexidine Gluconate Cloth  6 each Topical Daily  . feeding supplement (PRO-STAT SUGAR FREE 64)  30 mL Per Tube TID  . folic acid  1 mg Intravenous Daily  . heparin  5,000 Units Subcutaneous Q8H  . mouth rinse  15 mL Mouth Rinse 10 times per day  .  nicotine  14 mg Transdermal Daily  . pantoprazole (PROTONIX) IV  40 mg Intravenous Q24H  . polyethylene glycol  17 g Per Tube Daily  . sodium chloride flush  10-40 mL Intracatheter Q12H  . thiamine injection  100 mg Intravenous Daily   Continuous: . sodium chloride Stopped (07/06/19 0330)  . sodium chloride 10 mL/hr at 07/01/19 0300  . ceFEPime (MAXIPIME) IV 2 g (07/06/19 0550)  . dexmedetomidine (PRECEDEX) IV infusion 1 mcg/kg/hr (07/06/19 0548)  . dextrose 75 mL/hr at 07/06/19 0523  . feeding supplement (VITAL 1.5 CAL)    . norepinephrine (LEVOPHED) Adult infusion Stopped (07/03/19 0351)    Pertinent Labs/Diagnostics: -CK 633 which is trended down from 982 -Potassium 3.2 -Ammonia 11  MR BRAIN WO CONTRAST Result Date: 07/05/2019  IMPRESSION: Normal MRI of the brain. Electronically Signed   By: Ulyses Jarred M.D.   On: 07/05/2019 03:22     MR THORACIC SPINE W WO CONTRAST/MRI of cervical spine with and without contrast Result Date: 07/06/2019  IMPRESSION: 1. No evidence of myelopathy or other cause of weakness. 2. Extensive bilateral airspace disease. 3. Motion degraded. Electronically Signed   By: Monte Fantasia M.D.   On: 07/06/2019 06:16   EEG adult Result Date: 07/04/2019   IMPRESSION: This technically difficult study issuggestive of profound diffuse encephalopathy, non specific to etiology but could be seen due to sedation, hypoxic-anoxic injury.No seizures or epileptiform discharges were seen throughout the recording.  [place] PA-C [facility] [phone]  Assessment:  39 year old male who presented to the hospital status post cardiac arrest, downtime approximately 10 minutes with bilateral paraparesis, with some antigravity movement to noxious stimuli on exam yesterday.  Patient not showing any movement today at this time.  Patient does experience pain to noxious stimuli in all 4 extremities.  EEG obtained on 1/3 shows generalized  slowing with no epileptiform discharges.  Cephalopathic and slow to answer questions.  MRI of both thoracic and cervical spine do not show etiology for patient's paraparesis.  Ammonia within normal limits.   Impression: -Status post cardiac arrest -Aspiration pneumonia -Bilateral upper and lower extremity weakness -No cord infarct on MRI  Recommendations: -Continue neuro exams -See if cefepime can be replaced with alternative antibiotic -We will continue to follow patient with you    07/06/2019, 9:07 AM  NEUROHOSPITALIST ADDENDUM Performed a face to face diagnostic evaluation.   I have reviewed the contents of history and physical exam as documented by PA/ARNP/Resident and agree with above documentation.  I have discussed and formulated the above plan as documented. Edits to the note have been made as needed.  Patient somnolent, not following commands. MRI C and T spine negative for spinal cord infarction.     Karena Addison Presley Gora MD [facility] [phone]   If 7pm to 7am, please call on call as listed on AMION.

## 2019-07-06 NOTE — Progress Notes (Signed)
PULMONARY / CRITICAL CARE MEDICINE   Name: Pedro Gomez MRN: [id number] DOB: [birth year]    ADMISSION DATE:  06/30/2019 CONSULTATION DATE:  06/30/19  CHIEF COMPLAINT:  shock  HISTORY OF PRESENT ILLNESS:   39 yo gentleman with history of afib, polysubstance use with witnessed cardiac arrest w/bystander followed by EMS CPR and vfib/vtach s/p defibrillation in the field.    PAST MEDICAL HISTORY :  He  has a past medical history of Atrial fibrillation (HCC), Polysubstance abuse (HCC), and Tobacco abuse.  PAST SURGICAL HISTORY: He  has no past surgical history on file.  No Known Allergies  No current facility-administered medications on file prior to encounter.   Current Outpatient Medications on File Prior to Encounter  Medication Sig  . famotidine (PEPCID) 10 MG tablet Take 10 mg by mouth 2 (two) times daily as needed for heartburn or indigestion.    FAMILY HISTORY:  His He indicated that the status of his mother is unknown. He indicated that the status of his father is unknown.   SOCIAL HISTORY: He  has an unknown smoking status. He has never used smokeless tobacco. He reports current drug use. Drug: Marijuana.  REVIEW OF SYSTEMS:   Intubated  SUBJECTIVE:  No acute events overnight, he reports he is not in any pain  VITAL SIGNS: BP 114/80   Pulse 80   Temp 98.6 F (37 C) (Axillary)   Resp (!) 24   Ht 6\' 3"  (1.905 m)   Wt 80.7 kg   SpO2 98%   BMI 22.24 kg/m   HEMODYNAMICS:    VENTILATOR SETTINGS: Vent Mode: PRVC FiO2 (%):  [50 %-60 %] 50 % Set Rate:  [12 bmp] 12 bmp Vt Set:  [650 mL] 650 mL PEEP:  [5 cmH20] 5 cmH20 Plateau Pressure:  [16 cmH20-29 cmH20] 16 cmH20  INTAKE / OUTPUT: I/O last 3 completed shifts: In: 4169.7 [I.V.:2699; NG/GT:190; IV Piggyback:1280.7] Out: 5010 [Urine:3010; Emesis/NG output:2000]  PHYSICAL EXAMINATION: General:  Well nourished AAM Neuro:   More improvement today answering yes or no by shaking his head, does not wiggle  toes, able to track you with eye movements, does have sensation to touch in extremities HEENT:  ET tube in place Cardiovascular:  RRR, No MRG, no LE edema Lungs:  Left lung clear, Right upper field clear, Coarse sounds in lower fields Abdomen:  Non distended, normal bs Skin:  No rashes or bruising noted  LABS:  BMET Recent Labs  Lab 07/04/19 0204 07/05/19 0401 07/06/19 0609  NA 137 137 137  K 4.6 3.7 3.2*  CL 107 101 99  CO2 23 26 30   BUN 23* 23* 22*  CREATININE 0.84 0.95 1.01  GLUCOSE 112* 113* 110*    Electrolytes Recent Labs  Lab 07/02/19 0413 07/02/19 1632 07/03/19 0214 07/03/19 1626 07/04/19 0204 07/05/19 0401 07/05/19 1054 07/06/19 0609  CALCIUM  --   --  8.4*  --  8.4* 8.5*  --  8.3*  MG  --  2.0 1.7 1.8  --   --   --   --   PHOS   < > 3.3 3.6 2.8  --   --  1.9*  --    < > = values in this interval not displayed.    CBC Recent Labs  Lab 07/04/19 0204 07/05/19 0401 07/06/19 0609  WBC 9.6 9.0 9.8  HGB 13.0 11.2* 11.1*  HCT 40.1 33.2* 31.9*  PLT 160 165 163    Coag's Recent Labs  Lab 06/30/19 2150  07/01/19 0755  APTT 28 30  INR 1.1 1.1    Sepsis Markers Recent Labs  Lab 06/30/19 2150 07/01/19 0248 07/03/19 1008  LATICACIDVEN 6.0* 4.4*  --   PROCALCITON  --   --  2.67    ABG Recent Labs  Lab 06/30/19 2335  PHART 7.368  PCO2ART 34.1  PO2ART 430.0*    Liver Enzymes Recent Labs  Lab 06/30/19 2150 07/01/19 0248 07/03/19 1008  AST 66* 80* 41  ALT 66* 76* 41  ALKPHOS 50 46 63  BILITOT 0.7 0.8 0.8  ALBUMIN 3.6 3.6 2.9*    Cardiac Enzymes No results for input(s): TROPONINI, PROBNP in the last 168 hours.  Glucose Recent Labs  Lab 07/05/19 0818 07/05/19 1126 07/05/19 1524 07/05/19 2048 07/06/19 0006 07/06/19 0607  GLUCAP 114* 115* 103* 110* 108* 107*    Imaging MR CERVICAL SPINE W WO CONTRAST  Result Date: 07/06/2019 CLINICAL DATA:  Motor neuron disease after cardiac arrest. Cannot move extremities EXAM: MRI  CERVICAL AND THORACIC SPINE WITHOUT AND WITH CONTRAST TECHNIQUE: Multisequence MR imaging of the cervical and thoracic was performed prior to and following IV contrast. CONTRAST:  72mL GADAVIST GADOBUTROL 1 MMOL/ML IV SOLN COMPARISON:  None. FINDINGS: MRI CERVICAL SPINE FINDINGS Alignment: Reversal of cervical lordosis.  No listhesis. Vertebrae: No fracture, evidence of discitis, or bone lesion. Cord: Axial images are significantly limited by motion such that cord evaluation is primarily based on sagittal images. No evidence of swelling, abnormal enhancement, or signal abnormality. Posterior Fossa, vertebral arteries, paraspinal tissues: Negative Disc levels: C2-3: Unremarkable. C3-4: Uncovertebral ridging with patent canal and foramina C4-5: Disc narrowing with bilateral vertebral ridging and foraminal stenosis that is mild on the right and borderline moderate on the left. C5-6: Disc narrowing and bulging with shallow protrusion. The canal and foramina remain patent C6-7: Mild right-sided annulus bulging.  No impingement C7-T1:Unremarkable. MRI THORACIC SPINE FINDINGS Alignment:  Normal Vertebrae: No fracture, evidence of discitis, or bone lesion. Cord:  Normal signal and morphology. Paraspinal and other soft tissues: Bilateral airspace disease as seen on chest x-ray from 3 days ago. Disc levels: No significant degenerative changes or impingement. IMPRESSION: 1. No evidence of myelopathy or other cause of weakness. 2. Extensive bilateral airspace disease. 3. Motion degraded. Electronically Signed   By: Marnee Spring M.D.   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 Cannot move extremities EXAM: MRI CERVICAL AND THORACIC SPINE WITHOUT AND WITH CONTRAST TECHNIQUE: Multisequence MR imaging of the cervical and thoracic was performed prior to and following IV contrast. CONTRAST:  35mL GADAVIST GADOBUTROL 1 MMOL/ML IV SOLN  COMPARISON:  None. FINDINGS: MRI CERVICAL SPINE FINDINGS Alignment: Reversal of cervical lordosis.  No listhesis. Vertebrae: No fracture, evidence of discitis, or bone lesion. Cord: Axial images are significantly limited by motion such that cord evaluation is primarily based on sagittal images. No evidence of swelling, abnormal enhancement, or signal abnormality. Posterior Fossa, vertebral arteries, paraspinal tissues: Negative Disc levels: C2-3: Unremarkable. C3-4: Uncovertebral ridging with patent canal and foramina C4-5: Disc narrowing with bilateral vertebral ridging and foraminal stenosis that is mild on the right and borderline moderate on the left. C5-6: Disc narrowing and bulging with shallow protrusion. The canal and foramina remain patent C6-7: Mild right-sided annulus bulging.  No impingement C7-T1:Unremarkable. MRI THORACIC SPINE FINDINGS Alignment:  Normal Vertebrae: No fracture, evidence of discitis, or bone lesion. Cord:  Normal signal and morphology.  Paraspinal and other soft tissues: Bilateral airspace disease as seen on chest x-ray from 3 days ago. Disc levels: No significant degenerative changes or impingement. IMPRESSION: 1. No evidence of myelopathy or other cause of weakness. 2. Extensive bilateral airspace disease. 3. Motion degraded. Electronically Signed   By: Monte Fantasia M.D.   On: 07/06/2019 06:16     CULTURES: 06/30/19 Urine: NGTD Respiratory: NGTD Blood: NGTD ANTIBIOTICS: Cefepime 1/4--1/8  ASSESSMENT / PLAN:  NEUROLOGIC A: Encephalopathy: 2/2 poor cerebral perfusion from cardiac arrest.  He was intubated and sedated after cardiac arrest, and on targeted temperature management.   No seizure activity seen on initial EEG.  Twitching temporal muscle and abnormal eye movement noted 1/5 evening EEG repeated with no sz activity.  Normal MRI brain 1/5.  Following commands 1/6 but no extremity movement.  1/7 answering yes no questions with head shake.  Still not able to move  extremities, uncertain why  P:   -continue to monitor for recovery, neurology following   PULMONARY A: Acute respiratory failure: intubated during cardiac arrest, initially no infectious pneumonic or parapnuemonic abnormalities.  Aspiration event early am of 1/3 with new R mid to lower lobe opacification consistent with aspiration noted on x-ray along with decreased but still adequate oxygenation.    P:   -continue working on aspiration prevention, trickle tube feeds -d/c cefepime after tomorrow -Full vent support, VAP ppx, SBT when able  INFECTIOUS A:   Aspiration event with pneumonitis, high risk for subsequent PNA P:   -after today will d/c cefepime   CARDIOVASCULAR A:  Cardiac arrest: likely cardiogenic shock, may have had some stunning from cocaine vs prolonged arrhythmia, ECHO delayed and shows recovery of EF, no RWMA seen consistent with his now minimally elevated troponin.  Off all vasopressors on 1/4 normotensive  P:  -close electrolyte monitoring and replacement -continue gentle maintenace fluids with K for today -continuous cardiac monitoring -d/c asa  HEMATOLOGIC A:   Mild normocytic anemia P:  -continue to monitor  GASTROINTESTINAL A:   Nutrition: currently intubated and sedated Vomiting episodes: two vomiting events so far one resulting in aspiration P:   -continue reglan and suppositories, bowel regimen -continue protonix -repeat abd x-ray -start back trickle feeds today  FAMILY  - Updates: Calling and updating family today  - Inter-disciplinary family meet or Palliative Care meeting due by:  day 7  Pulmonary and [place] Pager: [phone] Vickki Muff MD PGY-3 Internal Medicine Pager # [phone]   07/06/2019, 7:21 AM

## 2019-07-06 NOTE — Progress Notes (Signed)
Pharmacy Antibiotic Note  Pedro Gomez is a 39 y.o. male admitted on 06/30/2019 with cardiac arrest s/p TTM. Pharmacy has been consulted for cefepime dosing with concerns for PNA. Cultures negative, Cr stable, WBC normal.  Plan: Cefepime 2g IV q8h Follow cultures, renal function, LOT  Height: 6\' 3"  (190.5 cm) Weight: 177 lb 14.6 oz (80.7 kg) IBW/kg (Calculated) : 84.5  Temp (24hrs), Avg:100 F (37.8 C), Min:98.6 F (37 C), Max:100.8 F (38.2 C)  Recent Labs  Lab 06/30/19 2150 07/01/19 0248 07/02/19 0413 07/03/19 0214 07/04/19 0204 07/05/19 0401 07/06/19 0609  WBC 9.3 10.0 10.3 14.1* 9.6 9.0 9.8  CREATININE 1.67* 1.38* 1.00 0.90 0.84 0.95 1.01  LATICACIDVEN 6.0* 4.4*  --   --   --   --   --     Estimated Creatinine Clearance: 113.2 mL/min (by C-G formula based on SCr of 1.01 mg/dL).    No Known Allergies  Antimicrobials this admission: Cefepime 1/4 >>  Microbiology results: 1/1 BCx: NGTD 1/1 UCx: negative 1/2 MRSA: negative   09/03/19, PharmD, BCPS Clinical Pharmacist [phone] Please check AMION for all [facility] numbers 07/06/2019

## 2019-07-06 NOTE — Progress Notes (Signed)
RT note-Attempted wean this morning, stomach is distended, PIP's and Plat elevated, increased RR, placed back to full support, will re assess later this morning.

## 2019-07-07 ENCOUNTER — Encounter (HOSPITAL_COMMUNITY): Payer: Self-pay | Admitting: Critical Care Medicine

## 2019-07-07 ENCOUNTER — Inpatient Hospital Stay (HOSPITAL_COMMUNITY): Payer: Self-pay

## 2019-07-07 ENCOUNTER — Other Ambulatory Visit: Payer: Self-pay

## 2019-07-07 LAB — MAGNESIUM: Magnesium: 1.9 mg/dL (ref 1.7–2.4)

## 2019-07-07 LAB — PHOSPHORUS: Phosphorus: 2.2 mg/dL — ABNORMAL LOW (ref 2.5–4.6)

## 2019-07-07 LAB — COMPREHENSIVE METABOLIC PANEL
ALT: 32 U/L (ref 0–44)
AST: 35 U/L (ref 15–41)
Albumin: 2 g/dL — ABNORMAL LOW (ref 3.5–5.0)
Alkaline Phosphatase: 52 U/L (ref 38–126)
Anion gap: 9 (ref 5–15)
BUN: 25 mg/dL — ABNORMAL HIGH (ref 6–20)
CO2: 26 mmol/L (ref 22–32)
Calcium: 8.3 mg/dL — ABNORMAL LOW (ref 8.9–10.3)
Chloride: 106 mmol/L (ref 98–111)
Creatinine, Ser: 0.97 mg/dL (ref 0.61–1.24)
GFR calc Af Amer: >60 mL/min (ref >60)
GFR calc non Af Amer: >60 mL/min (ref >60)
Glucose, Bld: 117 mg/dL — ABNORMAL HIGH (ref 70–99)
Potassium: 3.3 mmol/L — ABNORMAL LOW (ref 3.5–5.1)
Sodium: 141 mmol/L (ref 135–145)
Total Bilirubin: 1.4 mg/dL — ABNORMAL HIGH (ref 0.3–1.2)
Total Protein: 5.5 g/dL — ABNORMAL LOW (ref 6.5–8.1)

## 2019-07-07 LAB — CBC
HCT: 30.6 % — ABNORMAL LOW (ref 39.0–52.0)
Hemoglobin: 10.6 g/dL — ABNORMAL LOW (ref 13.0–17.0)
MCH: 31.2 pg (ref 26.0–34.0)
MCHC: 34.6 g/dL (ref 30.0–36.0)
MCV: 90 fL (ref 80.0–100.0)
Platelets: 166 10*3/uL (ref 150–400)
RBC: 3.4 MIL/uL — ABNORMAL LOW (ref 4.22–5.81)
RDW: 12.6 % (ref 11.5–15.5)
WBC: 8.3 10*3/uL (ref 4.0–10.5)
nRBC: 0 % (ref 0.0–0.2)

## 2019-07-07 LAB — GLUCOSE, CAPILLARY
Glucose-Capillary: 100 mg/dL — ABNORMAL HIGH (ref 70–99)
Glucose-Capillary: 103 mg/dL — ABNORMAL HIGH (ref 70–99)
Glucose-Capillary: 106 mg/dL — ABNORMAL HIGH (ref 70–99)
Glucose-Capillary: 113 mg/dL — ABNORMAL HIGH (ref 70–99)
Glucose-Capillary: 114 mg/dL — ABNORMAL HIGH (ref 70–99)
Glucose-Capillary: 95 mg/dL (ref 70–99)

## 2019-07-07 IMAGING — DX DG ABD PORTABLE 1V
1 series · 1 of 1 positions shown · non-contrast
Comparison: [DATE]

CLINICAL DATA: Orogastric tube placement

EXAM:
PORTABLE ABDOMEN - 1 VIEW

[abdomen kub]
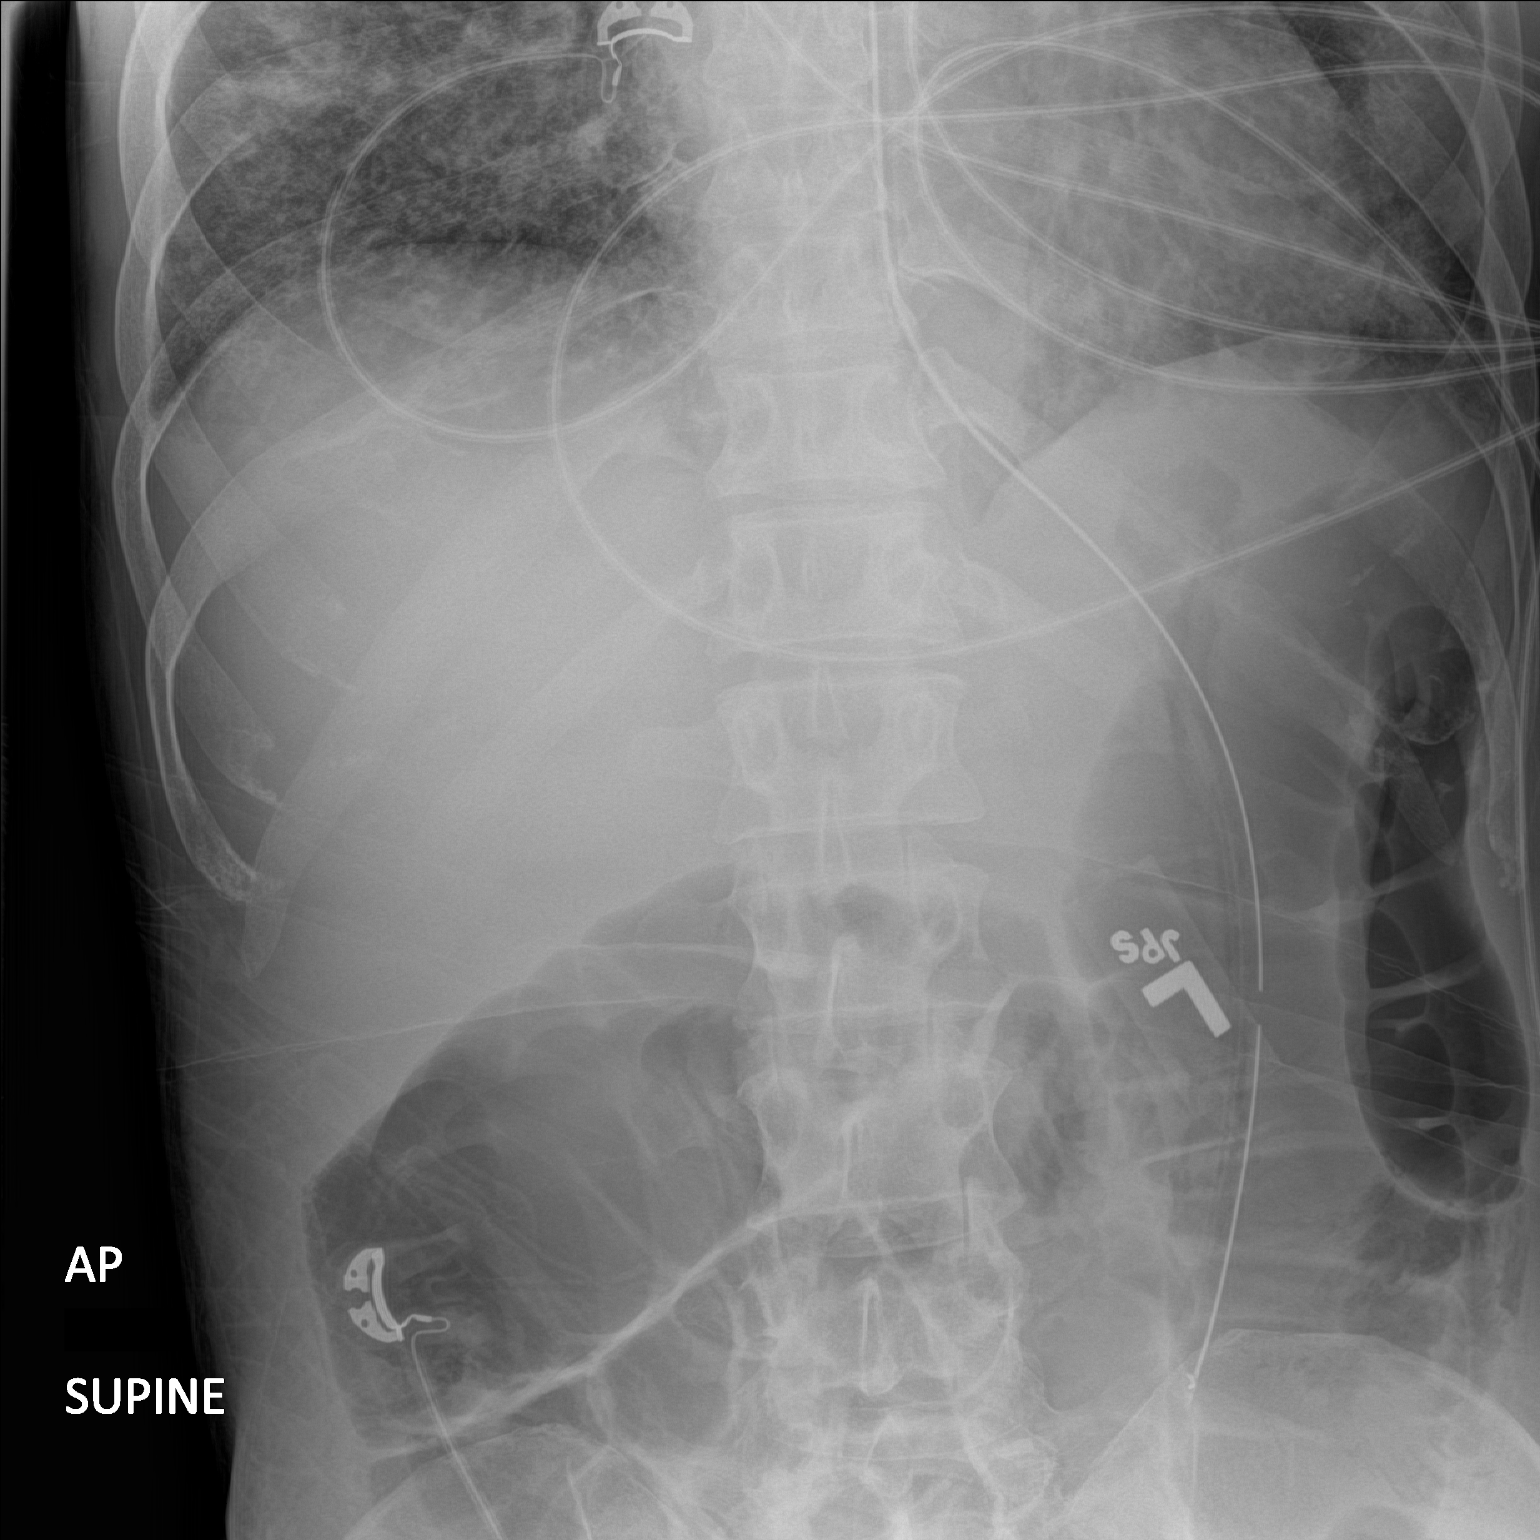

[1 of 1 positions shown; findings below may reference images not displayed]

FINDINGS: Orogastric tube tip and side port are now in the distal body of the
stomach. There remains colonic dilatation. No free air. There is
atelectatic change in the lung bases.
IMPRESSION: Orogastric tube tip and side port in distal body of stomach.
Persistent colonic dilatation. Suspect a degree of colonic ileus. No
free air. Stable appearing lung bases.

## 2019-07-07 IMAGING — DX DG CHEST 1V PORT
1 series · 1 of 1 positions shown · non-contrast
Comparison: Chest x-ray [DATE], [DATE].

CLINICAL DATA: Respiratory failure.

EXAM:
PORTABLE CHEST 1 VIEW

[chest]
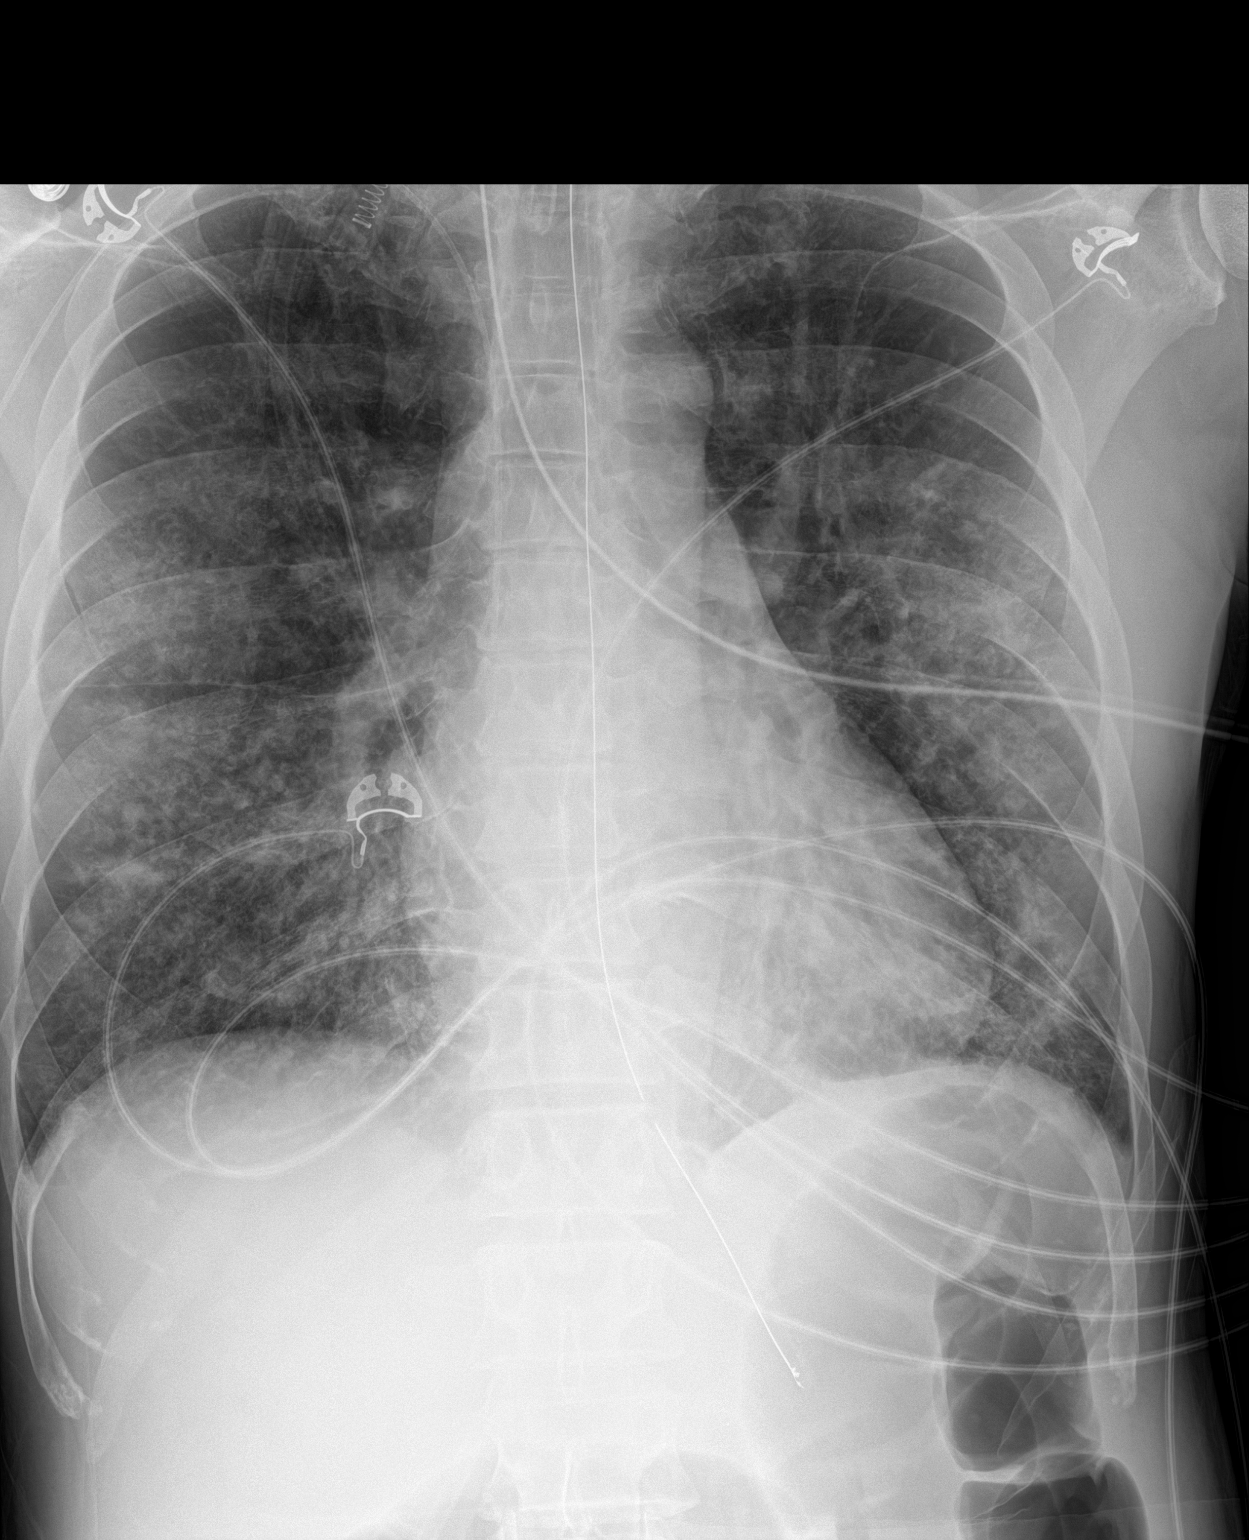

[1 of 1 positions shown; findings below may reference images not displayed]

FINDINGS: Endotracheal tube tip 5 cm above the carina. NG tube tip noted over
the stomach. NG tube side hole just above the gastroesophageal
junction. Advancement of the NG tube approximately 10 cm should be
considered. Right PICC line in stable position. Stable cardiomegaly.
Diffuse multifocal bilateral pulmonary infiltrates/edema again
noted. Interim significant improvement in aeration of the right lung
base from prior exam. Increased infiltrates in the upper lungs
bilaterally noted. No pleural effusion or pneumothorax.
IMPRESSION: 1. Endotracheal tip 5 cm above the carina. Right PICC line stable
position.

2. NG tube tip noted over the stomach. NG tube side hole just above
the gastroesophageal junction. Advancement of the NG tube
approximately 10 cm should be considered.

3.  Stable cardiomegaly.

4. Diffuse multifocal bilateral pulmonary infiltrates/edema again
noted. Interim significant improvement in aeration of the right lung
base from prior exam. Increased infiltrates in the upper lungs
bilaterally noted.

## 2019-07-07 MED ORDER — VITAL 1.5 CAL PO LIQD
1000.0000 mL | ORAL | Status: DC
  Administered 2019-07-07 – 2019-07-10 (×3): 1000 mL
  Filled 2019-07-07 (×7): qty 1000

## 2019-07-07 MED ORDER — PRO-STAT SUGAR FREE PO LIQD
60.0000 mL | Freq: Two times a day (BID) | ORAL | Status: DC
  Administered 2019-07-07 – 2019-07-11 (×8): 60 mL
  Filled 2019-07-07 (×6): qty 60

## 2019-07-07 MED ORDER — FREE WATER
100.0000 mL | Freq: Four times a day (QID) | Status: DC
  Administered 2019-07-07 – 2019-07-10 (×10): 100 mL

## 2019-07-07 MED ORDER — POTASSIUM CHLORIDE 10 MEQ/50ML IV SOLN
10.0000 meq | INTRAVENOUS | Status: AC
  Administered 2019-07-07 (×4): 10 meq via INTRAVENOUS
  Filled 2019-07-07 (×4): qty 50

## 2019-07-07 MED ORDER — OSMOLITE 1.5 CAL PO LIQD: 1000.0000 mL | ORAL | Status: DC

## 2019-07-07 MED ORDER — ACETAMINOPHEN 650 MG RE SUPP: 650.0000 mg | RECTAL | Status: DC | PRN

## 2019-07-07 MED ORDER — METOCLOPRAMIDE HCL 5 MG/ML IJ SOLN
10.0000 mg | Freq: Four times a day (QID) | INTRAMUSCULAR | Status: AC
  Administered 2019-07-07 – 2019-07-11 (×14): 10 mg via INTRAVENOUS
  Filled 2019-07-07 (×14): qty 2

## 2019-07-07 NOTE — Progress Notes (Signed)
eLink Physician-Brief Progress Note Patient Name: ARTHER HEISLER DOB: [birth year] MRN: [id number]   Date of Service  07/07/2019  HPI/Events of Note  K+ = 3.3 and Creatinine = 0.97.  eICU Interventions  Will replace K+.     Intervention Category Major Interventions: Electrolyte abnormality - evaluation and management  Tiphanie Vo Eugene 07/07/2019, 4:10 AM

## 2019-07-07 NOTE — Procedures (Signed)
Cortrak  Person Inserting Tube:  Pedro Gomez, RD Tube Type:  Cortrak - 43 inches Tube Location:  Left nare Initial Placement:  Stomach Secured by: Bridle Technique Used to Measure Tube Placement:  Documented cm marking at nare/ corner of mouth Cortrak Secured At:  75 cm   Tube left at the pylorus. MD made aware. Pt to start reglan. No x-ray is required. RN may begin using tube.   If the tube becomes dislodged please keep the tube and contact the Cortrak team at [email or web address] ([email or web address]) for replacement.  If after hours and replacement cannot be delayed, place a NG tube and confirm placement with an abdominal x-ray.    Vanessa Kick RD, LDN Clinical Nutrition Pager # [phone]

## 2019-07-07 NOTE — Progress Notes (Addendum)
Nutrition Follow-up  RD working remotely.   DOCUMENTATION CODES:   Not applicable  INTERVENTION:  - will order TF for once Cortrak placed: Vital 1.5 @ 15 ml/hr to advance by 10 ml every 8 hours to reach goal rate of 55 ml/hr with 60 ml prostat BID and 100 ml free water QID. - at goal rate, this regimen will provide 2380 kcal, 149 grams protein, and 1408 ml free water.    Monitor magnesium, potassium, and phosphorus daily for at least 3 days, MD to replete as needed, as pt is at risk for refeeding syndrome given period without nutrition, current mild hypokalemia with need for repletion.    NUTRITION DIAGNOSIS:   Inadequate oral intake related to inability to eat as evidenced by NPO status. -ongoing  GOAL:   Patient will meet greater than or equal to 90% of their needs -unmet at this time  MONITOR:   Vent status, TF tolerance, Labs, Weight trends  ASSESSMENT:   39 year old male presented with witnessed cardiac arrest. Admitted to Cardiac unit on normothermia protocol. PMH includes paroxysmal atrial fibrillation, polysubstance abuse.  Patient remains intubated and NPO. Plan for post-pyloric Cortrak placement today (post-pyloric d/t colonic ileus). Will place TF order as outlined above for once tube is placed. Current weight (80.8 kg) used to re-estimate kcal need as weight is -3.1 kg compared to admission (1/1) weight.   S/p PEA arrest thought to be d/t cocaine use. Aspiration PNA during this admission. He has been experiencing unexplained paraplegia. Mental status has significantly improved but he has failed SBT; consideration of trach early next week.   Patient is currently intubated on ventilator support MV: 17 L/min Temp (24hrs), Avg:99.2 F (37.3 C), Min:98.2 F (36.8 C), Max:100.4 F (38 C) Propofol: none   Labs reviewed; CBGs: 114 and 103 mg/dl, K: 3.3 mmol/l, BUN: 25 mg/dl, Ca: 8.3 mg/dl, Phos: 2.2 mg/dl. Medications reviewed; 1 mg IV folic acid/day, 10 mg IV reglan  QID, 1 packet miralax/day, 10 mEq IV KCl x4 runs 1/8, 10 ml senokot BID, 100 mg IV thiamine/day.   IVF; D5-NS-40 mEq KCl @ 75 ml/hr (306 kcal).  Drip; precedex @ 1.8 mcg/kg    NUTRITION - FOCUSED PHYSICAL EXAM:  unable to complete at this time.   Diet Order:   Diet Order    None      EDUCATION NEEDS:   Not appropriate for education at this time  Skin:  Skin Assessment: Reviewed RN Assessment  Last BM:  PTA  Height:   Ht Readings from Last 1 Encounters:  07/02/19 6\' 3"  (1.905 m)    Weight:   Wt Readings from Last 1 Encounters:  07/07/19 80.8 kg    Ideal Body Weight:     BMI:  Body mass index is 22.26 kg/m.  Estimated Nutritional Needs:   Kcal:  2404 kcal  Protein:  135-150 grams  Fluid:  >/= 2.4 L/day     09/04/19, MS, RD, LDN, [facility] Inpatient Clinical Dietitian Pager # [phone] After hours/weekend pager # [phone]

## 2019-07-07 NOTE — Progress Notes (Signed)
PULMONARY / CRITICAL CARE MEDICINE   Name: Pedro Gomez MRN: [id number] DOB: 1980-08-19    ADMISSION DATE:  06/30/2019 CONSULTATION DATE:  06/30/19  CHIEF COMPLAINT:  shock  HISTORY OF PRESENT ILLNESS:   39 yo gentleman with history of afib, polysubstance use with witnessed cardiac arrest w/bystander followed by EMS CPR and vfib/vtach s/p defibrillation in the field.    PAST MEDICAL HISTORY :  He  has a past medical history of Atrial fibrillation (HCC), Polysubstance abuse (HCC), and Tobacco abuse.  PAST SURGICAL HISTORY: He  has no past surgical history on file.  No Known Allergies  No current facility-administered medications on file prior to encounter.   Current Outpatient Medications on File Prior to Encounter  Medication Sig  . famotidine (PEPCID) 10 MG tablet Take 10 mg by mouth 2 (two) times daily as needed for heartburn or indigestion.    FAMILY HISTORY:  His He indicated that the status of his mother is unknown. He indicated that the status of his father is unknown.   SOCIAL HISTORY: He  has an unknown smoking status. He has never used smokeless tobacco. He reports current drug use. Drug: Marijuana.  REVIEW OF SYSTEMS:   Intubated  SUBJECTIVE:  No acute events overnight, he reports he is not in any pain  VITAL SIGNS: BP 109/77   Pulse 82   Temp (!) 100.4 F (38 C) (Oral)   Resp (!) 35   Ht 6\' 3"  (1.905 m)   Wt 80.8 kg   SpO2 100%   BMI 22.26 kg/m   HEMODYNAMICS:    VENTILATOR SETTINGS: Vent Mode: PRVC FiO2 (%):  [40 %] 40 % Set Rate:  [12 bmp] 12 bmp Vt Set:  [650 mL] 650 mL PEEP:  [5 cmH20] 5 cmH20 Plateau Pressure:  [24 cmH20-30 cmH20] 25 cmH20  INTAKE / OUTPUT: I/O last 3 completed shifts: In: 3948.1 [I.V.:2568.1; NG/GT:290; IV Piggyback:1090] Out: 4275 [Urine:2975; Emesis/NG output:1300]  PHYSICAL EXAMINATION: General:  Well nourished AAM Neuro:  Awake and following facial commands but no movement in extremities. HEENT:  ET tube in  place Cardiovascular:  RRR, No MRG, no LE edema Lungs:  Tachypnea on SBT with rhonchi  Abdomen:  Distended with decreased bowel sounds Skin:  No rashes or bruising noted  LABS:  BMET Recent Labs  Lab 07/05/19 0401 07/06/19 0609 07/07/19 0246  NA 137 137 141  K 3.7 3.2* 3.3*  CL 101 99 106  CO2 26 30 26   BUN 23* 22* 25*  CREATININE 0.95 1.01 0.97  GLUCOSE 113* 110* 117*    Electrolytes Recent Labs  Lab 07/03/19 1626 07/05/19 0401 07/05/19 1054 07/06/19 0609 07/07/19 0246  CALCIUM  --  8.5*  --  8.3* 8.3*  MG 1.8  --   --  2.2 1.9  PHOS 2.8  --  1.9* 2.8 2.2*    CBC Recent Labs  Lab 07/05/19 0401 07/06/19 0609 07/07/19 0246  WBC 9.0 9.8 8.3  HGB 11.2* 11.1* 10.6*  HCT 33.2* 31.9* 30.6*  PLT 165 163 166    Coag's Recent Labs  Lab 06/30/19 2150 07/01/19 0755  APTT 28 30  INR 1.1 1.1    Sepsis Markers Recent Labs  Lab 06/30/19 2150 07/01/19 0248 07/03/19 1008  LATICACIDVEN 6.0* 4.4*  --   PROCALCITON  --   --  2.67    ABG Recent Labs  Lab 06/30/19 2335  PHART 7.368  PCO2ART 34.1  PO2ART 430.0*    Liver Enzymes Recent Labs  Lab 07/01/19 0248 07/03/19 1008 07/07/19 0246  AST 80* 41 35  ALT 76* 41 32  ALKPHOS 46 63 52  BILITOT 0.8 0.8 1.4*  ALBUMIN 3.6 2.9* 2.0*    Cardiac Enzymes No results for input(s): TROPONINI, PROBNP in the last 168 hours.  Glucose Recent Labs  Lab 07/06/19 1156 07/06/19 1615 07/06/19 1944 07/06/19 2348 07/07/19 0328 07/07/19 0758  GLUCAP 108* 113* 117* 107* 114* 103*    Imaging DG CHEST PORT 1 VIEW  Result Date: 07/07/2019 CLINICAL DATA:  Respiratory failure. EXAM: PORTABLE CHEST 1 VIEW COMPARISON:  Chest x-ray 07/03/2019, 06/30/2019. FINDINGS: Endotracheal tube tip 5 cm above the carina. NG tube tip noted over the stomach. NG tube side hole just above the gastroesophageal junction. Advancement of the NG tube approximately 10 cm should be considered. Right PICC line in stable position. Stable  cardiomegaly. Diffuse multifocal bilateral pulmonary infiltrates/edema again noted. Interim significant improvement in aeration of the right lung base from prior exam. Increased infiltrates in the upper lungs bilaterally noted. No pleural effusion or pneumothorax. IMPRESSION: 1. Endotracheal tip 5 cm above the carina. Right PICC line stable position. 2. NG tube tip noted over the stomach. NG tube side hole just above the gastroesophageal junction. Advancement of the NG tube approximately 10 cm should be considered. 3.  Stable cardiomegaly. 4. Diffuse multifocal bilateral pulmonary infiltrates/edema again noted. Interim significant improvement in aeration of the right lung base from prior exam. Increased infiltrates in the upper lungs bilaterally noted. Electronically Signed   By: Marcello Moores  Register   On: 07/07/2019 06:19   DG Abd Portable 1V  Result Date: 07/07/2019 CLINICAL DATA:  Orogastric tube placement EXAM: PORTABLE ABDOMEN - 1 VIEW COMPARISON:  July 06, 2019 FINDINGS: Orogastric tube tip and side port are now in the distal body of the stomach. There remains colonic dilatation. No free air. There is atelectatic change in the lung bases. IMPRESSION: Orogastric tube tip and side port in distal body of stomach. Persistent colonic dilatation. Suspect a degree of colonic ileus. No free air. Stable appearing lung bases. Electronically Signed   By: Lowella Grip III M.D.   On: 07/07/2019 09:02   DG Abd Portable 1V  Result Date: 07/06/2019 CLINICAL DATA:  Cardiac arrest EXAM: PORTABLE ABDOMEN - 1 VIEW COMPARISON:  07/03/2019 FINDINGS: NG tube tip is in the proximal stomach with the side port in the distal esophagus. There is diffuse gaseous distention of bowel, most notable throughout the colon, likely ileus. This has progressed since prior study. No visible free air. IMPRESSION: Increasing gaseous distention of bowel, predominantly colon, likely ileus. Electronically Signed   By: Rolm Baptise M.D.   On:  07/06/2019 10:47   CULTURES: 06/30/19 Urine: NGTD Respiratory: NGTD Blood: NGTD ANTIBIOTICS: Cefepime 1/4--1/8  ASSESSMENT / PLAN:  Critically ill due to acute mixed respiratory failure requiring mechanical ventilation.  Status post PEA cardiac arrest likely due to cocaine use Aspiration pneumonia Acute hypoxic ischemic encephalopathy without acute MRI changes Unexplained paraplegia Tube feed intolerance due to ileus  Mental status has improved sufficiently to allow for extubation, however has failed an SBT and concern regarding ability to protect airway and cough given paraplegia. We will continue SBT's over the weekend but if no substantial improvement, will consider option of tracheostomy early next week.  Etiology of paraparesis remains unclear. Will initiate postpyloric feeds in the context of colonic ileus.   Daily Goals Checklist  Pain/Anxiety/Delirium protocol (if indicated): Precedex infusion only VAP protocol (if indicated): bundle in place  Respiratory support goals: failed SBT today - continue daily SBT Blood pressure target: no pressors.  DVT prophylaxis: Lovenox Nutrition Status: Nutrition Problem: Inadequate oral intake Etiology: inability to eat Signs/Symptoms: NPO status Interventions: NPO due to ileus. Start prokinetics and place SBFT GI prophylaxis: protonix Fluid status goals: clinically euvolemic- allow autoregulation Urinary catheter: Condom catheter Central lines: R arm PICC. Glucose control: euglycemic on no insulin. Mobility/therapy needs: bedrest. Antibiotic de-escalation: off cefepime.  Home medication reconciliation: no relevant medications. Daily labs: CBC, CMP daily Code Status: Full code Family Communication: Will update family today. Disposition: ICU  CRITICAL CARE Performed by: Lynnell Catalan   Total critical care time: 40 minutes  Critical care time was exclusive of separately billable procedures and treating other  patients.  Critical care was necessary to treat or prevent imminent or life-threatening deterioration.  Critical care was time spent personally by me on the following activities: development of treatment plan with patient and/or surrogate as well as nursing, discussions with consultants, evaluation of patient's response to treatment, examination of patient, obtaining history from patient or surrogate, ordering and performing treatments and interventions, ordering and review of laboratory studies, ordering and review of radiographic studies, pulse oximetry, re-evaluation of patient's condition and participation in multidisciplinary rounds.  Lynnell Catalan, MD [facility] ICU Physician [facility]  Pager: [phone] Mobile: [phone] After hours: [phone].   07/07/2019, 9:57 AM

## 2019-07-07 NOTE — Progress Notes (Addendum)
NEUROLOGY PROGRESS NOTE  Subjective: Patient awake, alert, in bed, NAD, still intubated, but being weaned off the vent. Still on precedex. Able to follow commands and shake head yes and no to questions.   Exam: Vitals:   07/07/19 0752 07/07/19 0757  BP: 109/77   Pulse: 82   Resp: (!) 35   Temp:  (!) 100.4 F (38 C)  SpO2:      ROS Unable to obtain secondary to intubation and slow thought process with minimal answering of questions   Physical Exam  Constitutional: Appears well-developed and well-nourished.  Eyes: No scleral injection HENT: No OP obstrucion Head: Normocephalic.  Cardiovascular: Normal rate and regular rhythm.  Respiratory: Breathing on his own, but still intubated. GI: Soft.  No distension. There is no tenderness.  Skin: WDI   Neuro:  Mental Status: Alert, answers questions with shaking his head yes and no.  Able to follow some simple commands.  Cranial Nerves: II: Intact bilaterally.  Attempts to mouth words with ET tube in place.   III,IV, VI: ptosis not present, extra-ocular motions intact bilaterally pupils equal, round, reactive to light and accommodation V,VII: face appears symmetric in presence of ETT, facial light touch sensation normal bilaterally VIII: hearing normal bilaterally Motor: Able to raise BUE anti gravity. Able to move BLE laterally across bed. Attempts were made to life BUE anti- gravity. Sensory: light touch sensation intact. Patient nods yes when asked does LT feel the same.     Medications:  Scheduled: . acetaminophen (TYLENOL) oral liquid 160 mg/5 mL  650 mg Per Tube Q6H  . bisacodyl  10 mg Rectal Once  . chlorhexidine gluconate (MEDLINE KIT)  15 mL Mouth Rinse BID  . Chlorhexidine Gluconate Cloth  6 each Topical Daily  . enoxaparin (LOVENOX) injection  40 mg Subcutaneous Q24H  . folic acid  1 mg Intravenous Daily  . mouth rinse  15 mL Mouth Rinse 10 times per day  . nicotine  14 mg Transdermal Daily  . pantoprazole  (PROTONIX) IV  40 mg Intravenous Q24H  . polyethylene glycol  17 g Per Tube Daily  . sennosides  10 mL Oral BID  . sodium chloride flush  10-40 mL Intracatheter Q12H  . thiamine injection  100 mg Intravenous Daily   Continuous: . sodium chloride Stopped (07/07/19 0639)  . sodium chloride 10 mL/hr at 07/01/19 0300  . dexmedetomidine (PRECEDEX) IV infusion 1.5 mcg/kg/hr (07/07/19 0731)  . dextrose 5 % and 0.45% NaCl 1,000 mL with potassium chloride 40 mEq infusion 75 mL/hr at 07/07/19 0700  . norepinephrine (LEVOPHED) Adult infusion Stopped (07/03/19 0351)  . potassium chloride 10 mEq (07/07/19 0822)    Pertinent Labs/Diagnostics: 1/8/20201 -Potassium 3.3 -BUN: 25  MR BRAIN WO CONTRAST Result Date: 07/05/2019  IMPRESSION: Normal MRI of the brain. Electronically Signed   By: Ulyses Jarred M.D.   On: 07/05/2019 03:22     MR THORACIC SPINE W WO CONTRAST/MRI of cervical spine with and without contrast Result Date: 07/06/2019  IMPRESSION: 1. No evidence of myelopathy or other cause of weakness. 2. Extensive bilateral airspace disease. 3. Motion degraded. Electronically Signed   By: Monte Fantasia M.D.   On: 07/06/2019 06:16   EEG adult Result Date: 07/04/2019   IMPRESSION: This technically difficult study issuggestive of profound diffuse encephalopathy, non specific to etiology but could be seen due to sedation, hypoxic-anoxic injury.No seizures or epileptiform discharges were seen throughout the recording.  Pedro Gomez    Assessment:  39 year old male  who presented to the hospital status post cardiac arrest, downtime approximately 10 minutes with bilateral paraparesis, with some antigravity movement to noxious stimuli on exam yesterday.  Patient not showing any movement today at this time.  Patient does experience pain to noxious stimuli in all 4 extremities.  EEG obtained on 1/3 shows generalized slowing with no epileptiform discharges.  Cephalopathic and slow to answer  questions.  MRI of both thoracic and cervical spine do not show etiology for patient's paraparesis.  Ammonia within normal limits. Patient continues to improve.  Encephalopathy s/p cardiac arrest: -Status post cardiac arrest -Aspiration pneumonia -Bilateral upper and lower extremity paraparesis -No cord infarct on MRI  Recommendations: -Continue to monitor for neurological changes -Agree with holding cefepime --correct metabolic derangements - continue to wean sedation as possible  Laurey Morale, MSN, NP-C [facility] [phone]   07/07/2019, 9:12 AM

## 2019-07-08 LAB — IRON AND TIBC
Iron: 12 ug/dL — ABNORMAL LOW (ref 45–182)
Saturation Ratios: 7 % — ABNORMAL LOW (ref 17.9–39.5)
TIBC: 175 ug/dL — ABNORMAL LOW (ref 250–450)
UIBC: 163 ug/dL

## 2019-07-08 LAB — COMPREHENSIVE METABOLIC PANEL
ALT: 42 U/L (ref 0–44)
AST: 49 U/L — ABNORMAL HIGH (ref 15–41)
Albumin: 1.9 g/dL — ABNORMAL LOW (ref 3.5–5.0)
Alkaline Phosphatase: 87 U/L (ref 38–126)
Anion gap: 10 (ref 5–15)
BUN: 28 mg/dL — ABNORMAL HIGH (ref 6–20)
CO2: 22 mmol/L (ref 22–32)
Calcium: 7.8 mg/dL — ABNORMAL LOW (ref 8.9–10.3)
Chloride: 111 mmol/L (ref 98–111)
Creatinine, Ser: 0.89 mg/dL (ref 0.61–1.24)
GFR calc Af Amer: >60 mL/min (ref >60)
GFR calc non Af Amer: >60 mL/min (ref >60)
Glucose, Bld: 105 mg/dL — ABNORMAL HIGH (ref 70–99)
Potassium: 3.2 mmol/L — ABNORMAL LOW (ref 3.5–5.1)
Sodium: 143 mmol/L (ref 135–145)
Total Bilirubin: 1 mg/dL (ref 0.3–1.2)
Total Protein: 5.4 g/dL — ABNORMAL LOW (ref 6.5–8.1)

## 2019-07-08 LAB — GLUCOSE, CAPILLARY
Glucose-Capillary: 105 mg/dL — ABNORMAL HIGH (ref 70–99)
Glucose-Capillary: 117 mg/dL — ABNORMAL HIGH (ref 70–99)
Glucose-Capillary: 99 mg/dL (ref 70–99)
Glucose-Capillary: 109 mg/dL — ABNORMAL HIGH (ref 70–99)
Glucose-Capillary: 114 mg/dL — ABNORMAL HIGH (ref 70–99)

## 2019-07-08 LAB — CBC
HCT: 29.4 % — ABNORMAL LOW (ref 39.0–52.0)
Hemoglobin: 10 g/dL — ABNORMAL LOW (ref 13.0–17.0)
MCH: 31.4 pg (ref 26.0–34.0)
MCHC: 34 g/dL (ref 30.0–36.0)
MCV: 92.5 fL (ref 80.0–100.0)
Platelets: 198 10*3/uL (ref 150–400)
RBC: 3.18 MIL/uL — ABNORMAL LOW (ref 4.22–5.81)
RDW: 12.8 % (ref 11.5–15.5)
WBC: 7.3 10*3/uL (ref 4.0–10.5)
nRBC: 0 % (ref 0.0–0.2)

## 2019-07-08 LAB — RETICULOCYTES
Immature Retic Fract: 9.7 % (ref 2.3–15.9)
RBC.: 3.28 MIL/uL — ABNORMAL LOW (ref 4.22–5.81)
Retic Count, Absolute: 29.8 10*3/uL (ref 19.0–186.0)
Retic Ct Pct: 0.9 % (ref 0.4–3.1)

## 2019-07-08 LAB — PHOSPHORUS: Phosphorus: 2.6 mg/dL (ref 2.5–4.6)

## 2019-07-08 LAB — FOLATE: Folate: 7.9 ng/mL (ref >5.9)

## 2019-07-08 LAB — MAGNESIUM: Magnesium: 1.6 mg/dL — ABNORMAL LOW (ref 1.7–2.4)

## 2019-07-08 LAB — FERRITIN: Ferritin: 364 ng/mL — ABNORMAL HIGH (ref 24–336)

## 2019-07-08 LAB — VITAMIN B12: Vitamin B-12: 461 pg/mL (ref 180–914)

## 2019-07-08 MED ORDER — ALTEPLASE 2 MG IJ SOLR
2.0000 mg | Freq: Once | INTRAMUSCULAR | Status: AC
  Administered 2019-07-08: 21:00:00 2 mg

## 2019-07-08 MED ORDER — MAGNESIUM SULFATE 2 GM/50ML IV SOLN
2.0000 g | Freq: Once | INTRAVENOUS | Status: AC
  Administered 2019-07-08: 09:00:00 2 g via INTRAVENOUS
  Filled 2019-07-08: qty 50

## 2019-07-08 MED ORDER — POTASSIUM CHLORIDE 10 MEQ/50ML IV SOLN
10.0000 meq | INTRAVENOUS | Status: AC
  Administered 2019-07-08 (×5): 10 meq via INTRAVENOUS
  Filled 2019-07-08 (×4): qty 50

## 2019-07-08 MED ORDER — ALTEPLASE 2 MG IJ SOLR
2.0000 mg | Freq: Once | INTRAMUSCULAR | Status: AC
  Administered 2019-07-08: 2 mg

## 2019-07-08 NOTE — Progress Notes (Signed)
10:57 SBT started 5/5 40%

## 2019-07-08 NOTE — Progress Notes (Signed)
PULMONARY / CRITICAL CARE MEDICINE   Name: Pedro Gomez MRN: [id number] DOB: [birth year]    ADMISSION DATE:  06/30/2019 CONSULTATION DATE:  06/30/19  CHIEF COMPLAINT:  shock  HISTORY OF PRESENT ILLNESS:   39 yo gentleman with history of afib, polysubstance use with witnessed cardiac arrest w/bystander followed by EMS CPR and vfib/vtach s/p defibrillation in the field.    PAST MEDICAL HISTORY :  He  has a past medical history of Atrial fibrillation ([place]), Polysubstance abuse ([place]), and Tobacco abuse.  PAST SURGICAL HISTORY: He  has no past surgical history on file.  No Known Allergies  No current facility-administered medications on file prior to encounter.   Current Outpatient Medications on File Prior to Encounter  Medication Sig  . famotidine (PEPCID) 10 MG tablet Take 10 mg by mouth 2 (two) times daily as needed for heartburn or indigestion.    FAMILY HISTORY:  His He indicated that the status of his mother is unknown. He indicated that the status of his father is unknown.   SOCIAL HISTORY: He  reports that he has been smoking cigarettes. He has never used smokeless tobacco. He reports current alcohol use. He reports current drug use. Drug: Marijuana.  REVIEW OF SYSTEMS:   Intubated  SUBJECTIVE:  No acute events overnight, he reports he is not in any pain  VITAL SIGNS: BP 107/75   Pulse 80   Temp 98.2 F (36.8 C) (Axillary)   Resp (!) 29   Ht 6\' 3"  (1.905 m)   Wt 78.8 kg   SpO2 98%   BMI 21.71 kg/m   HEMODYNAMICS:    VENTILATOR SETTINGS: Vent Mode: PRVC FiO2 (%):  [40 %] 40 % Set Rate:  [12 bmp] 12 bmp Vt Set:  [650 mL] 650 mL PEEP:  [5 cmH20] 5 cmH20 Plateau Pressure:  [22 cmH20-24 cmH20] 23 cmH20  INTAKE / OUTPUT: I/O last 3 completed shifts: In: 3215.3 [I.V.:2282.3; NG/GT:615; IV Piggyback:318.1] Out: 2145 [WCBJS:2831; Emesis/NG output:400]  PHYSICAL EXAMINATION: General:  Well nourished AAM Neuro:   More improvement today answering yes or  no by shaking his head, wiggles toes, slight movement of extremities when asked, able to track you with eye movements, does have sensation to touch in extremities HEENT:  ET tube in place Cardiovascular:  RRR, No MRG, no LE edema Lungs:  Left lung clear, Right upper field clear, Coarse sounds in lower fields Abdomen:  Non distended, normal bs Skin:  No rashes or bruising noted  LABS:  BMET Recent Labs  Lab 07/06/19 0609 07/07/19 0246 07/08/19 0411  NA 137 141 143  K 3.2* 3.3* 3.2*  CL 99 106 111  CO2 30 26 22   BUN 22* 25* 28*  CREATININE 1.01 0.97 0.89  GLUCOSE 110* 117* 105*    Electrolytes Recent Labs  Lab 07/06/19 0609 07/07/19 0246 07/08/19 0411  CALCIUM 8.3* 8.3* 7.8*  MG 2.2 1.9 1.6*  PHOS 2.8 2.2* 2.6    CBC Recent Labs  Lab 07/06/19 0609 07/07/19 0246 07/08/19 0411  WBC 9.8 8.3 7.3  HGB 11.1* 10.6* 10.0*  HCT 31.9* 30.6* 29.4*  PLT 163 166 198    Coag's Recent Labs  Lab 07/01/19 0755  APTT 30  INR 1.1    Sepsis Markers Recent Labs  Lab 07/03/19 1008  PROCALCITON 2.67    ABG No results for input(s): PHART, PCO2ART, PO2ART in the last 168 hours.  Liver Enzymes Recent Labs  Lab 07/03/19 1008 07/07/19 0246 07/08/19 0411  AST 41 35  49*  ALT 41 32 42  ALKPHOS 63 52 87  BILITOT 0.8 1.4* 1.0  ALBUMIN 2.9* 2.0* 1.9*    Cardiac Enzymes No results for input(s): TROPONINI, PROBNP in the last 168 hours.  Glucose Recent Labs  Lab 07/07/19 0758 07/07/19 1127 07/07/19 1551 07/07/19 2011 07/07/19 2341 07/08/19 0343  GLUCAP 103* 95 106* 113* 100* 105*    Imaging DG Abd Portable 1V  Result Date: 07/07/2019 CLINICAL DATA:  Orogastric tube placement EXAM: PORTABLE ABDOMEN - 1 VIEW COMPARISON:  July 06, 2019 FINDINGS: Orogastric tube tip and side port are now in the distal body of the stomach. There remains colonic dilatation. No free air. There is atelectatic change in the lung bases. IMPRESSION: Orogastric tube tip and side port in  distal body of stomach. Persistent colonic dilatation. Suspect a degree of colonic ileus. No free air. Stable appearing lung bases. Electronically Signed   By: Bretta Bang III M.D.   On: 07/07/2019 09:02     CULTURES: 06/30/19 Urine: NGTD Respiratory: NGTD Blood: NGTD ANTIBIOTICS: Cefepime 1/4--1/8  ASSESSMENT / PLAN:  NEUROLOGIC A: Encephalopathy: 2/2 poor cerebral perfusion from cardiac arrest.  He was intubated and sedated after cardiac arrest, and on targeted temperature management.   No seizure activity seen on initial EEG.  Twitching temporal muscle and abnormal eye movement noted 1/5 evening EEG repeated with no sz activity.  Normal MRI brain 1/5.  Following commands 1/6 but no extremity movement.  1/7 answering yes no questions with head shake.  4 extremity movement noted 1/8, still weak but improving 1/9  P:   -continue to monitor for recovery, neurology following  -start PT/OT  PULMONARY A: Acute respiratory failure: intubated during cardiac arrest, initially no infectious pneumonic or parapnuemonic abnormalities.  Aspiration event early am of 1/3 with new R mid to lower lobe opacification consistent with aspiration noted on x-ray along with decreased but still adequate oxygenation.  Failing SBT's, in consideration for trach  P:   -continue aspiration prevention -doing well on SBT overall ready for trial of extubation will order  INFECTIOUS A:   Aspiration event with pneumonitis, with subsequent PNA P:   -off cefepime and no infectious symptoms, continue monitoring   CARDIOVASCULAR A:  Cardiac arrest: likely cardiogenic shock, may have had some stunning from cocaine vs prolonged arrhythmia, ECHO delayed and shows recovery of EF, no RWMA seen consistent with his now minimally elevated troponin.  Off all vasopressors on 1/4 normotensive  P:  -close electrolyte monitoring and replacement -continuous cardiac monitoring  HEMATOLOGIC A:   Normocytic Anemia: Hgb  trending down, asa stopped 1/7, some workup ordered 1/8 no cause identified,  Reticulocyte index 0.42 indicating hypoproliferation P:  -continue to monitor  GASTROINTESTINAL A:   Ileus: has tube feeds, bowel regimen and prokinetics to stimulate motility, aggressively replacing electrolyte. cortrak placed 1/8, tube feeds restarted.  Still very distended Vomiting episodes: no vomiting in last 24hrs P:   -continue reglan and suppositories, bowel regimen, electrolyte replacement -continue tube feeds -continue protonix  FAMILY  - Updates: Calling and updating family today  - Inter-disciplinary family meet or Palliative Care meeting due by:  day 7  Pulmonary and Critical Care Medicine [facility] Pager: [phone] Thornell Mule MD PGY-3 Internal Medicine Pager # [phone]   07/08/2019, 7:55 AM

## 2019-07-08 NOTE — Progress Notes (Signed)
Inpatient Rehabilitation Admissions Coordinator  Inpatient rehab consult recommended by Therapy I will place order for consult and Admissions Coordinator will see patient on Monday for rehab assessment.  Ottie Glazier, RN, MSN Rehab Admissions Coordinator [phone] 07/08/2019 1:05 PM

## 2019-07-08 NOTE — Progress Notes (Signed)
Patient extubated per MD's order, placed on 4LNC, no stridor heard, patient is able to vocalize, SATS 94% RR 32 MPM, HR 72 BPM, BP 104/76 will continue to monitor patient.

## 2019-07-08 NOTE — Evaluation (Signed)
Physical Therapy Evaluation Patient Details Name: Pedro Gomez MRN: [id number] DOB: 1981/02/03 Today's Date: 07/08/2019   History of Present Illness  39 year old male who presented to the hospital status post cardiac arrest, downtime approximately 10 minutes with bilateral paraparesis  Clinical Impression  Orders received for PT evaluation. Patient demonstrates deficits in functional mobility as indicated below. Will benefit from continued skilled PT to address deficits and maximize function. Will see as indicated and progress as tolerated.  Anticipate patient will tolerate activities well and would benefit from comprehensive inpatient therapies. Highly recommend CIR consult.  Patient with noted delays in activation but can sustain against resistance. Following commands consistently with increased time. Will assess further once cleared for OOB.  (Per nursing request, patient remained bedlevel for today's eval.)    Follow Up Recommendations CIR    Equipment Recommendations       Recommendations for Other Services Rehab consult     Precautions / Restrictions Precautions Precautions: Fall Restrictions Weight Bearing Restrictions: No      Mobility  Bed Mobility Overal bed mobility: Needs Assistance Bed Mobility: Supine to Sit     Supine to sit: +2 for physical assistance;Mod assist     General bed mobility comments: long sit with two person assist moderate assist with UE support (per nursing, hold off on dangle and further mobility)  Transfers                    Ambulation/Gait                Stairs            Wheelchair Mobility    Modified Rankin (Stroke Patients Only)       Balance Overall balance assessment: Needs assistance Sitting-balance support: Bilateral upper extremity supported Sitting balance-Leahy Scale: Poor                                       Pertinent Vitals/Pain Pain Assessment: Faces Faces Pain Scale:  No hurt    Home Living Family/patient expects to be discharged to:: Private residence Living Arrangements: Parent               Additional Comments: patient poor historian but is alert to self and place    Prior Function Level of Independence: Independent               Hand Dominance        Extremity/Trunk Assessment   Upper Extremity Assessment Upper Extremity Assessment: Defer to OT evaluation(limited control, decreased activation )    Lower Extremity Assessment Lower Extremity Assessment: RLE deficits/detail;LLE deficits/detail RLE Deficits / Details: patient with noted activiation delays and limited ability to maintain in space, however strong against resistance RLE Sensation: decreased proprioception RLE Coordination: decreased fine motor;decreased gross motor LLE Deficits / Details: patient with noted activiation delays and limited ability to maintain in space, however strong against resistance LLE Sensation: decreased proprioception LLE Coordination: decreased fine motor;decreased gross motor       Communication      Cognition Arousal/Alertness: Awake/alert Behavior During Therapy: WFL for tasks assessed/performed Overall Cognitive Status: Impaired/Different from baseline Area of Impairment: Orientation;Following commands;Safety/judgement;Awareness;Problem solving;Memory;Attention                 Orientation Level: Disoriented to;Time;Situation Current Attention Level: Sustained Memory: Decreased short-term memory Following Commands: Follows one step commands with increased time;Follows multi-step commands with  increased time   Awareness: Emergent Problem Solving: Slow processing;Decreased initiation;Requires verbal cues;Requires tactile cues        General Comments      Exercises     Assessment/Plan    PT Assessment Patient needs continued PT services  PT Problem List Decreased strength;Decreased activity tolerance;Decreased  balance;Decreased mobility;Decreased coordination;Decreased cognition;Impaired sensation;Impaired tone       PT Treatment Interventions DME instruction;Gait training;Stair training;Functional mobility training;Therapeutic activities;Therapeutic exercise;Balance training;Neuromuscular re-education;Cognitive remediation    PT Goals (Current goals can be found in the Care Plan section)  Acute Rehab PT Goals Patient Stated Goal: none stated PT Goal Formulation: With patient Time For Goal Achievement: 07/22/19 Potential to Achieve Goals: Good    Frequency Min 3X/week   Barriers to discharge        Co-evaluation               AM-PAC PT "6 Clicks" Mobility  Outcome Measure Help needed turning from your back to your side while in a flat bed without using bedrails?: A Lot Help needed moving from lying on your back to sitting on the side of a flat bed without using bedrails?: A Lot Help needed moving to and from a bed to a chair (including a wheelchair)?: A Lot Help needed standing up from a chair using your arms (e.g., wheelchair or bedside chair)?: A Lot Help needed to walk in hospital room?: A Lot Help needed climbing 3-5 steps with a railing? : Total 6 Click Score: 11    End of Session         PT Visit Diagnosis: Muscle weakness (generalized) (M62.81);Difficulty in walking, not elsewhere classified (R26.2);Apraxia (R48.2);Other symptoms and signs involving the nervous system (R29.898)    Time: 7035-0093 PT Time Calculation (min) (ACUTE ONLY): 21 min   Charges:   PT Evaluation $PT Eval Moderate Complexity: 1 Mod          Alben Deeds, PT DPT  Board Certified Neurologic Specialist Acute Rehabilitation Services Office [place] 07/08/2019, 1:02 PM

## 2019-07-09 ENCOUNTER — Inpatient Hospital Stay (HOSPITAL_COMMUNITY): Payer: Self-pay

## 2019-07-09 LAB — GLUCOSE, CAPILLARY
Glucose-Capillary: 102 mg/dL — ABNORMAL HIGH (ref 70–99)
Glucose-Capillary: 112 mg/dL — ABNORMAL HIGH (ref 70–99)
Glucose-Capillary: 115 mg/dL — ABNORMAL HIGH (ref 70–99)
Glucose-Capillary: 115 mg/dL — ABNORMAL HIGH (ref 70–99)
Glucose-Capillary: 117 mg/dL — ABNORMAL HIGH (ref 70–99)
Glucose-Capillary: 98 mg/dL (ref 70–99)

## 2019-07-09 LAB — COMPREHENSIVE METABOLIC PANEL
ALT: 46 U/L — ABNORMAL HIGH (ref 0–44)
AST: 45 U/L — ABNORMAL HIGH (ref 15–41)
Albumin: 2.2 g/dL — ABNORMAL LOW (ref 3.5–5.0)
Alkaline Phosphatase: 86 U/L (ref 38–126)
Anion gap: 10 (ref 5–15)
BUN: 32 mg/dL — ABNORMAL HIGH (ref 6–20)
CO2: 23 mmol/L (ref 22–32)
Calcium: 8.3 mg/dL — ABNORMAL LOW (ref 8.9–10.3)
Chloride: 110 mmol/L (ref 98–111)
Creatinine, Ser: 0.8 mg/dL (ref 0.61–1.24)
GFR calc Af Amer: >60 mL/min (ref >60)
GFR calc non Af Amer: >60 mL/min (ref >60)
Glucose, Bld: 123 mg/dL — ABNORMAL HIGH (ref 70–99)
Potassium: 3.8 mmol/L (ref 3.5–5.1)
Sodium: 143 mmol/L (ref 135–145)
Total Bilirubin: 0.8 mg/dL (ref 0.3–1.2)
Total Protein: 5.7 g/dL — ABNORMAL LOW (ref 6.5–8.1)

## 2019-07-09 LAB — CBC
HCT: 34.1 % — ABNORMAL LOW (ref 39.0–52.0)
Hemoglobin: 11.3 g/dL — ABNORMAL LOW (ref 13.0–17.0)
MCH: 30.5 pg (ref 26.0–34.0)
MCHC: 33.1 g/dL (ref 30.0–36.0)
MCV: 92.2 fL (ref 80.0–100.0)
Platelets: 296 10*3/uL (ref 150–400)
RBC: 3.7 MIL/uL — ABNORMAL LOW (ref 4.22–5.81)
RDW: 13.1 % (ref 11.5–15.5)
WBC: 14.2 10*3/uL — ABNORMAL HIGH (ref 4.0–10.5)
nRBC: 0 % (ref 0.0–0.2)

## 2019-07-09 LAB — PHOSPHORUS: Phosphorus: 3.7 mg/dL (ref 2.5–4.6)

## 2019-07-09 LAB — BRAIN NATRIURETIC PEPTIDE: B Natriuretic Peptide: 108.2 pg/mL — ABNORMAL HIGH (ref 0.0–100.0)

## 2019-07-09 LAB — MAGNESIUM: Magnesium: 2.1 mg/dL (ref 1.7–2.4)

## 2019-07-09 IMAGING — DX DG CHEST 1V PORT
1 series · 1 of 1 positions shown · non-contrast
Comparison: Chest radiograph dated [DATE].

CLINICAL DATA: 38-year-old male with respiratory failure and
hypoxia.

EXAM:
PORTABLE CHEST 1 VIEW

[chest]
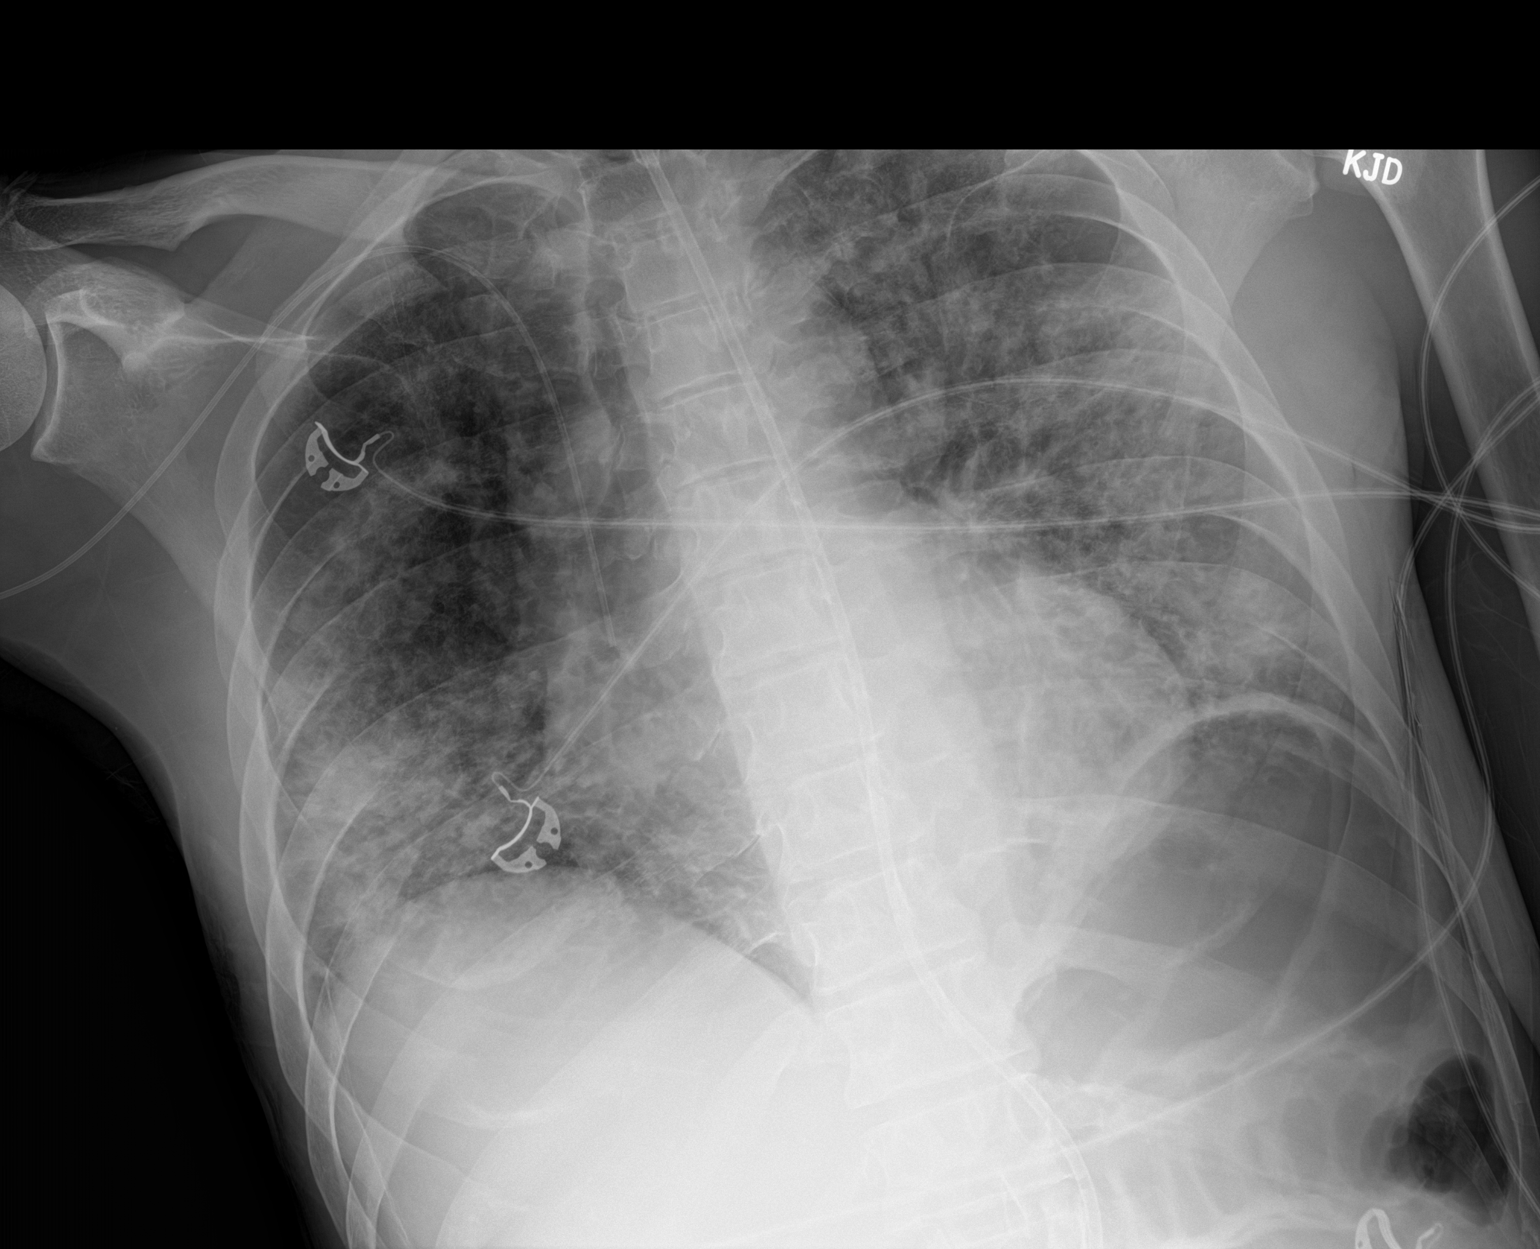

[1 of 1 positions shown; findings below may reference images not displayed]

FINDINGS: Interval removal of the enteric tube and placement of a feeding tube
with tip beyond the inferior margin of the image. Right-sided PICC
in similar position. Interval worsening of the diffuse bilateral
confluent airspace opacities since the prior radiograph. No large
pleural effusion or pneumothorax. Stable cardiac silhouette. No
acute osseous pathology.
IMPRESSION: Interval worsening of the diffuse bilateral confluent airspace
opacities. Continued follow-up recommended.

## 2019-07-09 MED ORDER — FUROSEMIDE 10 MG/ML IJ SOLN
INTRAMUSCULAR | Status: AC
  Administered 2019-07-09: 20 mg via INTRAVENOUS
  Filled 2019-07-09: qty 2

## 2019-07-09 MED ORDER — POTASSIUM CHLORIDE 20 MEQ PO PACK
40.0000 meq | PACK | Freq: Two times a day (BID) | ORAL | Status: AC
  Administered 2019-07-09 – 2019-07-10 (×3): 40 meq via ORAL
  Filled 2019-07-09 (×3): qty 2

## 2019-07-09 MED ORDER — FUROSEMIDE 10 MG/ML IJ SOLN
20.0000 mg | Freq: Once | INTRAMUSCULAR | Status: AC
  Administered 2019-07-09: 20 mg via INTRAVENOUS
  Filled 2019-07-09: qty 2

## 2019-07-09 MED ORDER — FUROSEMIDE 10 MG/ML IJ SOLN: 20.0000 mg | Freq: Once | INTRAMUSCULAR | Status: AC

## 2019-07-09 NOTE — Progress Notes (Signed)
Reason for consult: Encephalopathy, quadraparesis  Subjective: Patient has been extubated, is able to state his name and follow commands.   ROS: Unable to obtain due to poor mental status  Examination  Vital signs in last 24 hours: Temp:  [97.8 F (36.6 C)-99.4 F (37.4 C)] 98.6 F (37 C) (01/10 0751) Pulse Rate:  [73-111] 87 (01/10 0900) Resp:  [23-44] 30 (01/10 0900) BP: (101-147)/(69-92) 121/80 (01/10 0900) SpO2:  [88 %-100 %] 99 % (01/10 0900) FiO2 (%):  [40 %-80 %] 68 % (01/10 0814) Weight:  [75.6 kg] 75.6 kg (01/10 0600)  General: lying in bed CVS: pulse-normal rate and rhythm RS: breathing comfortably Extremities: normal   Neuro: MS: Alert to place and person, unable to provide history.  Impaired memory and still confused.  No obvious language deficit, repetition naming intact. CN: pupils equal and reactive,  EOMI, face symmetric, tongue midline, normal sensation over face, Motor:4/5 strength in all 4 extremities, continues to improve Reflexes: 2+ bilaterally over patella, biceps, plantars: flexor Coordination: Dysmetria to finger-nose as well as impaired heel-to-shin in bilateral legs Gait: not tested  Basic Metabolic Panel: Recent Labs  Lab 07/03/19 1626 07/03/19 1626 07/05/19 0401 07/05/19 1054 07/06/19 0609 07/07/19 0246 07/08/19 0411 07/09/19 0314  NA  --   --  137  --  137 141 143 143  K  --   --  3.7  --  3.2* 3.3* 3.2* 3.8  CL  --   --  101  --  99 106 111 110  CO2  --   --  26  --  30 26 22 23   GLUCOSE  --   --  113*  --  110* 117* 105* 123*  BUN  --   --  23*  --  22* 25* 28* 32*  CREATININE  --   --  0.95  --  1.01 0.97 0.89 0.80  CALCIUM  --    < > 8.5*  --  8.3* 8.3* 7.8* 8.3*  MG 1.8  --   --   --  2.2 1.9 1.6* 2.1  PHOS 2.8  --   --  1.9* 2.8 2.2* 2.6 3.7   < > = values in this interval not displayed.    CBC: Recent Labs  Lab 07/05/19 0401 07/06/19 0609 07/07/19 0246 07/08/19 0411 07/09/19 0314  WBC 9.0 9.8 8.3 7.3 14.2*  HGB  11.2* 11.1* 10.6* 10.0* 11.3*  HCT 33.2* 31.9* 30.6* 29.4* 34.1*  MCV 91.5 89.9 90.0 92.5 92.2  PLT 165 163 166 198 296     Coagulation Studies: No results for input(s): LABPROT, INR in the last 72 hours.  Imaging Reviewed:     ASSESSMENT AND PLAN  39 year old male status post PEA arrest, history of alcohol versus polysubstance abuse.  Neurology consulted as patient not moving bilateral upper and lower extremities, MRI C and T-spine rule out spinal cord infarct.  Patient subsequently improved and his encephalopathy continues to improve as well.  His symptoms of confusion and weakness likely from hypoxia ischemic encephalopathy as well as effect of sedation/cefepime, possibly worsened with metabolic disturbances.  Patient  continues to show improvement. Patient does have a EtOH abuse, but has continued to receive thiamine since admission.   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   Karena Addison Omya Winfield [facility] Pager Number [phone] For questions after 7pm please refer to  AMION to reach the Neurologist on call

## 2019-07-09 NOTE — Evaluation (Signed)
Clinical/Bedside Swallow Evaluation Patient Details  Name: Pedro Gomez MRN: [id number] Date of Birth: 1980-07-10  Today's Date: 07/09/2019 Time: SLP Start Time (ACUTE ONLY): 1019 SLP Stop Time (ACUTE ONLY): 1027 SLP Time Calculation (min) (ACUTE ONLY): 8 min  Past Medical History:  Past Medical History:  Diagnosis Date  . Atrial fibrillation (HCC)   . Polysubstance abuse (HCC)   . Tobacco abuse    Past Surgical History: History reviewed. No pertinent surgical history. HPI:  39 yo gentleman with history of afib, polysubstance use with witnessed cardiac arrest w/bystander followed by EMS CPR and vfib/vtach s/p defibrillation in the field.  CXR 1/10: "Interval worsening of the diffuse bilateral confluent airspace opacities"  Head CT 1/1: no acute findings   Assessment / Plan / Recommendation Clinical Impression  Pt presents with clinical indicators of pharyngeal dysphagia in setting of VDRF requiring 7 day intubation. There was immediate wet cough with 1 trial of thin liquid.  Pt tolerated puree with good oral clearance and no clinical s/s of aspiration.  With regular solid, pt was noted to take large bite.  There was dry cough expelling some cracker from oral cavity.  Following the swallow there was trace oral residue on palatte.  Recommend MBS prior to initiation of PO diet.  MBSS has been scheduled later this date. SLP Visit Diagnosis: Dysphagia, oropharyngeal phase (R13.12)    Aspiration Risk  Moderate aspiration risk    Diet Recommendation NPO        Other  Recommendations Oral Care Recommendations: Oral care QID   Follow up Recommendations (TBD)      Frequency and Duration (TBD)          Prognosis   TBD     Swallow Study   General Date of Onset: 06/30/19 HPI: 39 yo gentleman with history of afib, polysubstance use with witnessed cardiac arrest w/bystander followed by EMS CPR and vfib/vtach s/p defibrillation in the field.  CXR 1/10: "Interval worsening of the  diffuse bilateral confluent airspace opacities"  Head CT 1/1: no acute findings Type of Study: Bedside Swallow Evaluation Previous Swallow Assessment: None Diet Prior to this Study: NPO Temperature Spikes Noted: No Respiratory Status: Nasal cannula History of Recent Intubation: Yes Length of Intubations (days): 7 days Date extubated: 07/08/19 Behavior/Cognition: Alert;Cooperative;Pleasant mood Oral Cavity Assessment: Within Functional Limits Oral Care Completed by SLP: No Oral Cavity - Dentition: Adequate natural dentition Self-Feeding Abilities: Needs assist Patient Positioning: Upright in bed Baseline Vocal Quality: Breathy;Low vocal intensity Volitional Swallow: Able to elicit    Oral/Motor/Sensory Function Overall Oral Motor/Sensory Function: Within functional limits Facial ROM: Within Functional Limits Facial Symmetry: Within Functional Limits Lingual ROM: Within Functional Limits Lingual Symmetry: Within Functional Limits Lingual Strength: Within Functional Limits Velum: Within Functional Limits Mandible: Within Functional Limits   Ice Chips Ice chips: Within functional limits   Thin Liquid Thin Liquid: Impaired Presentation: Cup Pharyngeal  Phase Impairments: Cough - Immediate    Nectar Thick Nectar Thick Liquid: Not tested   Honey Thick Honey Thick Liquid: Not tested   Puree Puree: Within functional limits Presentation: Spoon   Solid     Solid: Impaired Presentation: Self Fed Pharyngeal Phase Impairments: Cough - Immediate      Kerrie Pleasure, MA, CCC-SLP Acute Rehabilitation Services Office: [phone]; Pager (1/10): [phone] 07/09/2019,11:06 AM

## 2019-07-09 NOTE — Progress Notes (Signed)
Modified Barium Swallow Progress Note  Patient Details  Name: Pedro Gomez MRN: [id number] Date of Birth: 02-12-1981  Today's Date: 07/09/2019  Modified Barium Swallow completed.  Full report located under Chart Review in the Imaging Section.  Brief recommendations include the following:  Clinical Impression  Pt presents with functional oropharyngeal swallowing.  There was no penetration or aspiration with any consistency trials, including thin liquid by straw.  With pill simulation there was oral statsis of tablet when given with thin liquid wash, and mastication of tablet when given with puree.  This is suspected to be 2/2 cognitive deficits rather than a true oral dysphagia.  Recommend regular texture diet with thin liquids.  Administer medications crushed with puree.  SLP to follow for diet tolerance.   Swallow Evaluation Recommendations   Recommended Consults: (Consider cognitive evaluation if pt does not return to baseline.)   SLP Diet Recommendations: Regular solids;Thin liquid   Liquid Administration via: Cup;No straw   Medication Administration: Crushed with puree   Supervision: Staff to assist with self feeding   Compensations: Slow rate;Small sips/bites   Postural Changes: Seated upright at 90 degrees   Oral Care Recommendations: Oral care BID        Kerrie Pleasure, MA, CCC-SLP Acute Rehabilitation Services Office: [phone]; Pager (1/10): [phone] 07/09/2019,1:49 PM

## 2019-07-09 NOTE — Progress Notes (Addendum)
PULMONARY / CRITICAL CARE MEDICINE   Name: Pedro Gomez MRN: [id number] DOB: 1980/10/30    ADMISSION DATE:  06/30/2019 CONSULTATION DATE:  06/30/19  CHIEF COMPLAINT:  shock  HISTORY OF PRESENT ILLNESS:   39 yo gentleman with history of afib, polysubstance use with witnessed cardiac arrest w/bystander followed by EMS CPR and vfib/vtach s/p defibrillation in the field.    PAST MEDICAL HISTORY :  He  has a past medical history of Atrial fibrillation ([place]), Polysubstance abuse ([place]), and Tobacco abuse.  PAST SURGICAL HISTORY: He  has no past surgical history on file.  No Known Allergies  No current facility-administered medications on file prior to encounter.   Current Outpatient Medications on File Prior to Encounter  Medication Sig  . famotidine (PEPCID) 10 MG tablet Take 10 mg by mouth 2 (two) times daily as needed for heartburn or indigestion.    FAMILY HISTORY:  His He indicated that the status of his mother is unknown. He indicated that the status of his father is unknown.   SOCIAL HISTORY: He  reports that he has been smoking cigarettes. He has never used smokeless tobacco. He reports current alcohol use. He reports current drug use. Drug: Marijuana.  REVIEW OF SYSTEMS:   Intubated  SUBJECTIVE:  No acute events overnight, he reports he is not in any pain, he is having bowel movements  VITAL SIGNS: BP 101/84 (BP Location: Left Arm)   Pulse 89   Temp 98.6 F (37 C) (Oral)   Resp (!) 25   Ht 6\' 3"  (1.905 m)   Wt 75.6 kg   SpO2 95%   BMI 20.83 kg/m   HEMODYNAMICS:    VENTILATOR SETTINGS: Vent Mode: PSV FiO2 (%):  [40 %-80 %] 68 % PEEP:  [5 cmH20] 5 cmH20 Pressure Support:  [5 cmH20] 5 cmH20  INTAKE / OUTPUT: I/O last 3 completed shifts: In: 1711.3 [I.V.:748.3; NG/GT:713; IV Piggyback:250] Out: 2395 [Urine:2395]  PHYSICAL EXAMINATION: General:  comfortable appearing  AAM Neuro:   Continues to improve, higher executive function testing today, knows  similarities between apple and orange answers seeds and shape, moving extremities still weak but improving HEENT:  NCAT Cardiovascular:  RRR, No MRG, no LE edema or JVD Lungs:  No increased wob, Slight decrease basilar sounds, broncial breath sounds LLL Abdomen:  Distended with interval improvement, normal bs Skin:  No rashes or bruising noted  LABS:  BMET Recent Labs  Lab 07/07/19 0246 07/08/19 0411 07/09/19 0314  NA 141 143 143  K 3.3* 3.2* 3.8  CL 106 111 110  CO2 26 22 23   BUN 25* 28* 32*  CREATININE 0.97 0.89 0.80  GLUCOSE 117* 105* 123*    Electrolytes Recent Labs  Lab 07/07/19 0246 07/08/19 0411 07/09/19 0314  CALCIUM 8.3* 7.8* 8.3*  MG 1.9 1.6* 2.1  PHOS 2.2* 2.6 3.7    CBC Recent Labs  Lab 07/07/19 0246 07/08/19 0411 07/09/19 0314  WBC 8.3 7.3 14.2*  HGB 10.6* 10.0* 11.3*  HCT 30.6* 29.4* 34.1*  PLT 166 198 296    Coag's No results for input(s): APTT, INR in the last 168 hours.  Sepsis Markers Recent Labs  Lab 07/03/19 1008  PROCALCITON 2.67    ABG No results for input(s): PHART, PCO2ART, PO2ART in the last 168 hours.  Liver Enzymes Recent Labs  Lab 07/07/19 0246 07/08/19 0411 07/09/19 0314  AST 35 49* 45*  ALT 32 42 46*  ALKPHOS 52 87 86  BILITOT 1.4* 1.0 0.8  ALBUMIN  2.0* 1.9* 2.2*    Cardiac Enzymes No results for input(s): TROPONINI, PROBNP in the last 168 hours.  Glucose Recent Labs  Lab 07/08/19 1132 07/08/19 1550 07/08/19 2012 07/09/19 0000 07/09/19 0334 07/09/19 0744  GLUCAP 99 109* 114* 112* 115* 117*    Imaging DG CHEST PORT 1 VIEW  Result Date: 07/09/2019 CLINICAL DATA:  39 year old male with respiratory failure and hypoxia. EXAM: PORTABLE CHEST 1 VIEW COMPARISON:  Chest radiograph dated 07/07/2019. FINDINGS: Interval removal of the enteric tube and placement of a feeding tube with tip beyond the inferior margin of the image. Right-sided PICC in similar position. Interval worsening of the diffuse bilateral  confluent airspace opacities since the prior radiograph. No large pleural effusion or pneumothorax. Stable cardiac silhouette. No acute osseous pathology. IMPRESSION: Interval worsening of the diffuse bilateral confluent airspace opacities. Continued follow-up recommended. Electronically Signed   By: Elgie Collard M.D.   On: 07/09/2019 03:52     CULTURES: 06/30/19 Urine: NGTD Respiratory: NGTD Blood: NGTD ANTIBIOTICS: Cefepime 1/4--1/8  ASSESSMENT / PLAN:  NEUROLOGIC A: Encephalopathy: 2/2 poor cerebral perfusion from cardiac arrest.  He was intubated and sedated after cardiac arrest, and on targeted temperature management.   No seizure activity seen on initial EEG.  Twitching temporal muscle and abnormal eye movement noted 1/5 evening EEG repeated with no sz activity.  Normal MRI brain 1/5.  Following commands 1/6 but no extremity movement.  1/7 answering yes no questions with head shake.  4 extremity movement noted 1/8, still weak but improving 1/9.  1/10 continued improvement in movement and abstract thinking.  P:   -continue to monitor for recovery, neurology following  -wean precedex -continue PT/OT  -SLP eval  PULMONARY A: Acute respiratory failure: intubated during cardiac arrest, initially no infectious pneumonic or parapnuemonic abnormalities.  Aspiration event early am of 1/3 with new R mid to lower lobe opacification consistent with aspiration noted on x-ray along with decreased but still adequate oxygenation.  Treated with cefepime for aspiration PNA 1/3-1/8.  Extubated 1/9  On HFNC  P:   -continue aspiration prevention -Ordered chest physiotherapy, encouraged working with incentive spiro with the help of our nursing staff -Important to keep working with PT -wean off precedex  -On high flow 13L, rec'd lasix this am, continue assessing respiratory function hopefully will continue to do well and not require reintubation  INFECTIOUS A:   Aspiration event with pneumonitis,  with subsequent PNA P:   -off cefepime and no infectious symptoms, continue monitoring   CARDIOVASCULAR A:  Cardiac arrest: likely cardiogenic shock, may have had some stunning from cocaine vs prolonged arrhythmia, ECHO delayed and shows recovery of EF, no RWMA seen consistent with his now minimally elevated troponin.  Off all vasopressors on 1/4 and normotensive  P:  -close electrolyte monitoring and replacement -continuous cardiac monitoring  HEMATOLOGIC A:   Normocytic Anemia: Hgb trending down, asa stopped 1/7, some workup ordered 1/8 no cause identified,  Reticulocyte index 0.42 indicating hypoproliferation Leukocytosis: new on 1/10,  P:  -continue to monitor  GASTROINTESTINAL A:   Ileus: has tube feeds, bowel regimen and prokinetics to stimulate motility, aggressively replacing electrolyte. cortrak placed 1/8, tube feeds restarted.  Still distended but improved Vomiting episodes: no vomiting in last 48hrs P:   -continue reglan and suppositories, bowel regimen, electrolyte replacement -SLP eval until PO intake continue tube feeds -continue protonix  FAMILY  - Updates: Calling and updating family today  - Inter-disciplinary family meet or Palliative Care meeting due by:  day 7  Pulmonary and Critical Care Medicine [facility] Pager: [phone] Thornell Mule MD PGY-3 Internal Medicine Pager # [phone]   07/09/2019, 8:24 AM

## 2019-07-09 NOTE — Progress Notes (Signed)
eLink Physician-Brief Progress Note Patient Name: Pedro Gomez DOB: Nov 23, 1980 MRN: [id number]   Date of Service  07/09/2019  HPI/Events of Note  CXR most consistent with volume overload / interstitial edema.    eICU Interventions  Will give 20mg  IV Lasix.     Intervention Category Major Interventions: Hypoxemia - evaluation and management  07/09/2019, 6:36 AM

## 2019-07-09 NOTE — Progress Notes (Signed)
eLink Physician-Brief Progress Note Patient Name: Pedro Gomez DOB: 1980-10-24 MRN: [id number]   Date of Service  07/09/2019  HPI/Events of Note  49M with history of cocaine abuse c/b VFib arrest prompting current hospital stay who was extubated within last 48 hours.  I am called because he desaturated to 70s relatively suddenly requiring NRB mask to be applied. He is currently breathing 30x/min with some accessory muscle use. Saturations on NRB are 98%. BP 107/69. HR 76.  RN suspects aspiration event.   eICU Interventions  CXR to further evaluate.  Hold tube feeds for now.  Will ask RT to switch him to HFNC given acute hypoxemic respiratory failure with increased work of breathing.  High risk for intubation.     Intervention Category Major Interventions: Respiratory failure - evaluation and management;Hypoxemia - evaluation and management  Janae Bridgeman 07/09/2019, 3:25 AM

## 2019-07-10 ENCOUNTER — Inpatient Hospital Stay (HOSPITAL_COMMUNITY): Payer: Self-pay

## 2019-07-10 LAB — CBC
HCT: 33.5 % — ABNORMAL LOW (ref 39.0–52.0)
Hemoglobin: 10.9 g/dL — ABNORMAL LOW (ref 13.0–17.0)
MCH: 30.4 pg (ref 26.0–34.0)
MCHC: 32.5 g/dL (ref 30.0–36.0)
MCV: 93.3 fL (ref 80.0–100.0)
Platelets: 396 10*3/uL (ref 150–400)
RBC: 3.59 MIL/uL — ABNORMAL LOW (ref 4.22–5.81)
RDW: 13.1 % (ref 11.5–15.5)
WBC: 13.2 10*3/uL — ABNORMAL HIGH (ref 4.0–10.5)
nRBC: 0 % (ref 0.0–0.2)

## 2019-07-10 LAB — GLUCOSE, CAPILLARY
Glucose-Capillary: 104 mg/dL — ABNORMAL HIGH (ref 70–99)
Glucose-Capillary: 104 mg/dL — ABNORMAL HIGH (ref 70–99)
Glucose-Capillary: 105 mg/dL — ABNORMAL HIGH (ref 70–99)
Glucose-Capillary: 107 mg/dL — ABNORMAL HIGH (ref 70–99)
Glucose-Capillary: 109 mg/dL — ABNORMAL HIGH (ref 70–99)
Glucose-Capillary: 113 mg/dL — ABNORMAL HIGH (ref 70–99)
Glucose-Capillary: 116 mg/dL — ABNORMAL HIGH (ref 70–99)
Glucose-Capillary: >600 mg/dL (ref 70–99)

## 2019-07-10 LAB — COMPREHENSIVE METABOLIC PANEL
ALT: 63 U/L — ABNORMAL HIGH (ref 0–44)
AST: 54 U/L — ABNORMAL HIGH (ref 15–41)
Albumin: 2.1 g/dL — ABNORMAL LOW (ref 3.5–5.0)
Alkaline Phosphatase: 101 U/L (ref 38–126)
Anion gap: 9 (ref 5–15)
BUN: 23 mg/dL — ABNORMAL HIGH (ref 6–20)
CO2: 26 mmol/L (ref 22–32)
Calcium: 8.2 mg/dL — ABNORMAL LOW (ref 8.9–10.3)
Chloride: 109 mmol/L (ref 98–111)
Creatinine, Ser: 0.78 mg/dL (ref 0.61–1.24)
GFR calc Af Amer: >60 mL/min (ref >60)
GFR calc non Af Amer: >60 mL/min (ref >60)
Glucose, Bld: 120 mg/dL — ABNORMAL HIGH (ref 70–99)
Potassium: 3.4 mmol/L — ABNORMAL LOW (ref 3.5–5.1)
Sodium: 144 mmol/L (ref 135–145)
Total Bilirubin: 0.7 mg/dL (ref 0.3–1.2)
Total Protein: 5.6 g/dL — ABNORMAL LOW (ref 6.5–8.1)

## 2019-07-10 LAB — MAGNESIUM: Magnesium: 1.7 mg/dL (ref 1.7–2.4)

## 2019-07-10 LAB — PHOSPHORUS: Phosphorus: 2.8 mg/dL (ref 2.5–4.6)

## 2019-07-10 IMAGING — DX DG CHEST 1V PORT
1 series · 1 of 1 positions shown · non-contrast
Comparison: [DATE]

CLINICAL DATA: Status post recent cardiac arrest

EXAM:
PORTABLE CHEST 1 VIEW

[chest]
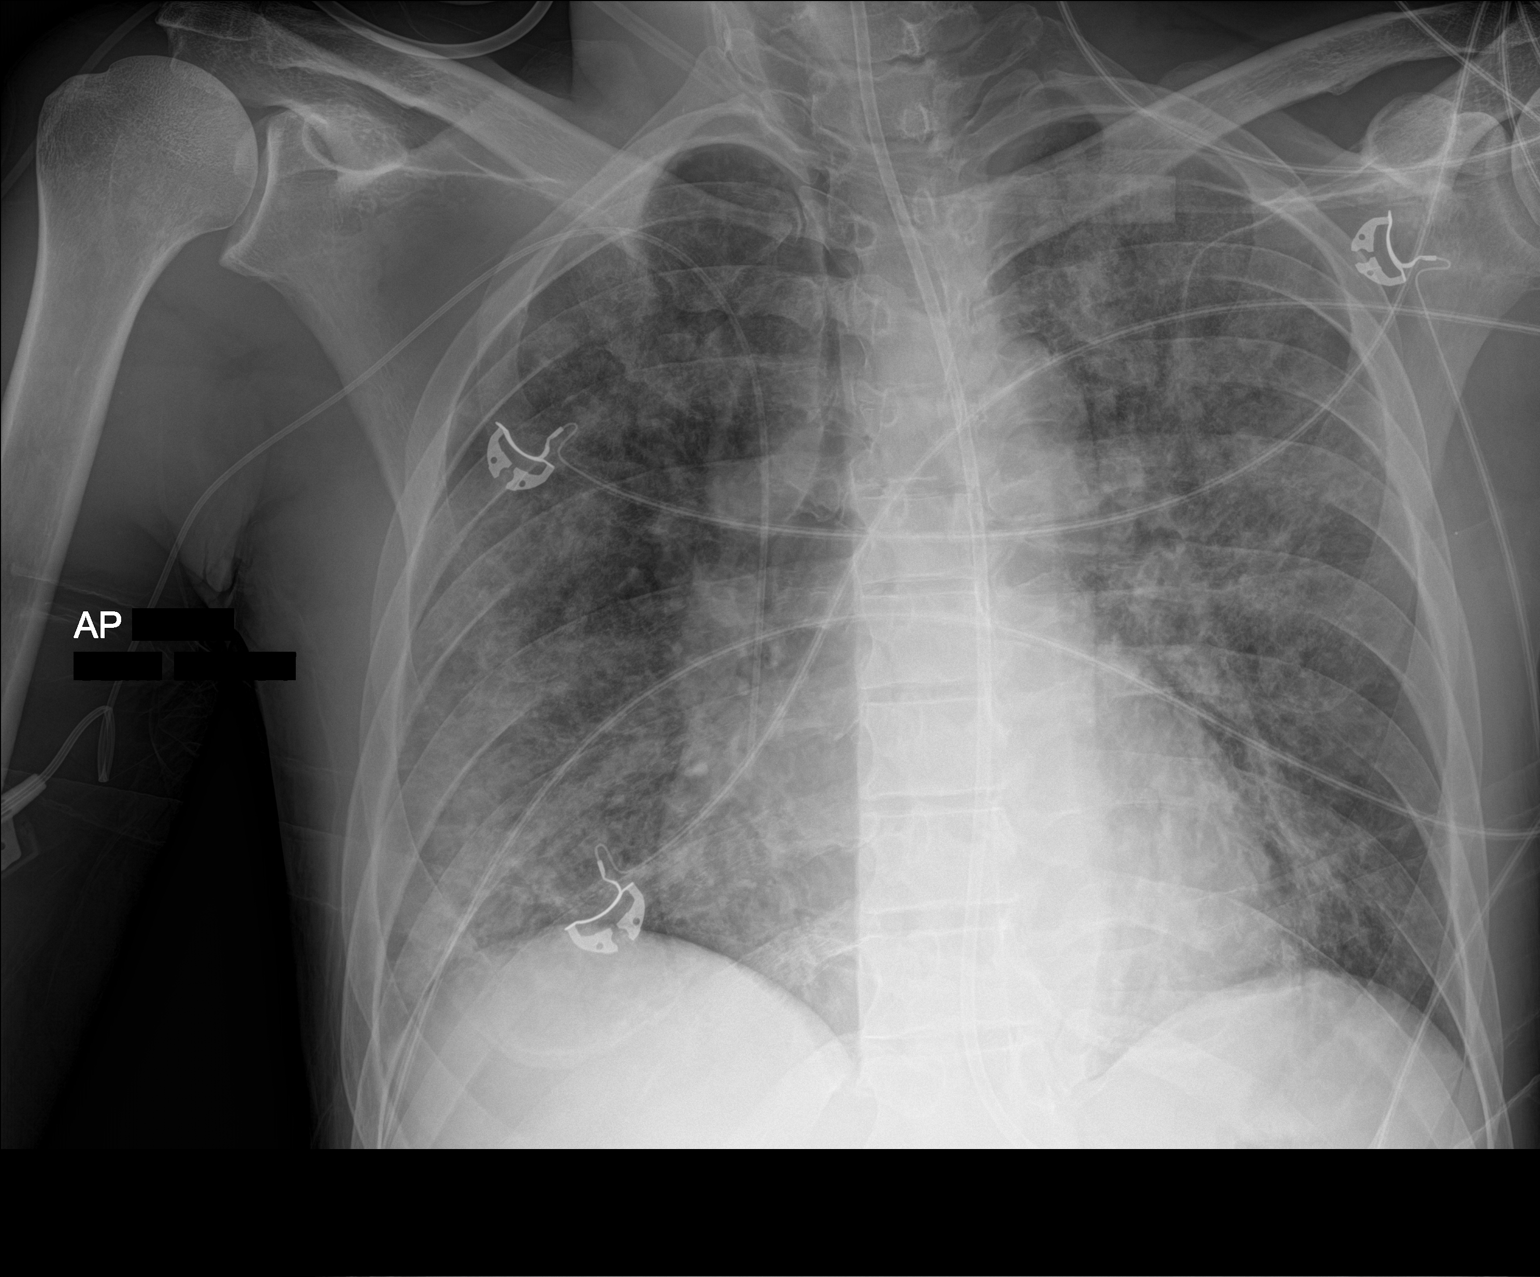

[1 of 1 positions shown; findings below may reference images not displayed]

FINDINGS: Feeding tube tip is below the diaphragm. Central catheter tip is
cavoatrial junction. No pneumothorax. There is widespread airspace
opacity throughout the lungs bilaterally with slightly less
consolidation in the bases. No new airspace opacity evident. Heart
upper normal in size with pulmonary vascularity normal. No
adenopathy. No bone lesions.
IMPRESSION: Tube and catheter positions as described without pneumothorax.
Widespread airspace opacity bilaterally. Question multifocal
pneumonia versus noncardiogenic edema. Both entities may be present
concurrently. Slightly less consolidation in the bases. No new
opacity evident. Stable cardiac silhouette.

## 2019-07-10 MED ORDER — SENNOSIDES 8.8 MG/5ML PO SYRP
10.0000 mL | ORAL_SOLUTION | Freq: Every day | ORAL | Status: DC
  Filled 2019-07-10 (×5): qty 10

## 2019-07-10 MED ORDER — ENSURE ENLIVE PO LIQD
237.0000 mL | Freq: Three times a day (TID) | ORAL | Status: DC
  Administered 2019-07-10 – 2019-07-14 (×4): 237 mL via ORAL

## 2019-07-10 MED ORDER — ADULT MULTIVITAMIN W/MINERALS CH
1.0000 | ORAL_TABLET | Freq: Every day | ORAL | Status: DC
  Administered 2019-07-10 – 2019-07-14 (×5): 1 via ORAL
  Filled 2019-07-10 (×5): qty 1

## 2019-07-10 MED ORDER — THIAMINE HCL 100 MG PO TABS
100.0000 mg | ORAL_TABLET | Freq: Every day | ORAL | Status: DC
  Administered 2019-07-10 – 2019-07-14 (×5): 100 mg via ORAL
  Filled 2019-07-10 (×5): qty 1

## 2019-07-10 NOTE — Progress Notes (Addendum)
Inpatient Rehab Admissions:  Inpatient Rehab Consult received.  I met with pt at the bedside for rehabilitation assessment and as follow up from PM&R consult. Feel pt is a great candidate for CIR. Due to cognitive deficits noted, I gathered most information from his mom. Discussed estimated daily cost of care for IP Rehab as pt is currently uninsured-per notes, financial counseling following this case. I have confirmed 24/7 A at DC and confirmed their preference for CIR at this time.   I do not have a bed available at this time. Will follow up once a bed becomes available for this patient.   Raechel Ache, OTR/L  Rehab Admissions Coordinator  [phone] 07/10/2019 3:01 PM

## 2019-07-10 NOTE — Consult Note (Signed)
Physical Medicine and Rehabilitation Consult   Reason for Consult: Functional decline.  Referring Physician: Dr. Denese Killings    HPI: Pedro Gomez is a 39 y.o. male with history of A fib,small PFO, polysubstance abuse who was admitted on 07/01/19 after witnessed cardiac arrest with 10 minutes downtime and bystander CPR as well as ACLS protocol with ROSC. GCS 3 at admission with UDS positive for cocaine and THC. He was intubated in ED, started on cooling protocol and noted to have increase in oral secretions with coughing after 30 minutes. CT head head negative for acute changes and VT/VF arrest felt to be due to cocaine. AKI with metabolic acidosis treated with IVF and he had bouts of agitation with shivering concerning for seizures. EEG showed no epileptiform activity and extensive muscle artifact noted therefore placed on LT-EEG which revealed profound diffuse encephalopathy without seizures. 2D echo showed EF 50-55% with no wall abnormality and intermittent bubbles in right heart.   Tube feeds started for nutritional support and placed on IV thiamine due to history of ETOH abuse. He had difficulty with vent wean as well as fevers with acute respiratory failure with felt to be due to pneumonitis v/s aspiration PNA.  He continued to have decrease in LOC with right gaze preference and MRI brain 1/6 was negative for acute changes with normal white matter signal. MRI cervical and  thoracic spine done due to tetraplegia and was negative for myelopathy, Fx or diskitis but showed extensive bilateral airspace disease. Neurology following of input on metabolic/anoxic encephalopathy with quadriparesis and recommended changing Cefepime to alternative antibiotics to avoid side effect.    Review of Systems  Unable to perform ROS: Mental acuity      Past Medical History:  Diagnosis Date  . Atrial fibrillation (HCC)   . Polysubstance abuse (HCC)   . Tobacco abuse     History reviewed. No  pertinent surgical history.    Family History  Problem Relation Age of Onset  . Hypertension Mother   . Hypertension Father     Social History:  Married? Has children? Per reports that he has been smoking cigarettes. He has never used smokeless tobacco. Per reports current alcohol use. And drug use. Drug: Marijuana and cocaine on UDS.    Allergies: No Known Allergies    Medications Prior to Admission  Medication Sig Dispense Refill  . famotidine (PEPCID) 10 MG tablet Take 10 mg by mouth 2 (two) times daily as needed for heartburn or indigestion.      Home: Home Living Family/patient expects to be discharged to:: Private residence Living Arrangements: Parent Additional Comments: patient poor historian but is alert to self and place  Functional History: Prior Function Level of Independence: Independent Functional Status:  Mobility: Bed Mobility Overal bed mobility: Needs Assistance Bed Mobility: Supine to Sit Supine to sit: +2 for physical assistance, Mod assist General bed mobility comments: long sit with two person assist moderate assist with UE support (per nursing, hold off on dangle and further mobility)        ADL:    Cognition: Cognition Overall Cognitive Status: Impaired/Different from baseline Orientation Level: Oriented to person Cognition Arousal/Alertness: Awake/alert Behavior During Therapy: WFL for tasks assessed/performed Overall Cognitive Status: Impaired/Different from baseline Area of Impairment: Orientation, Following commands, Safety/judgement, Awareness, Problem solving, Memory, Attention Orientation Level: Disoriented to, Time, Situation Current Attention Level: Sustained Memory: Decreased short-term memory Following Commands: Follows one step commands with increased time, Follows multi-step commands with increased time  Awareness: Emergent Problem Solving: Slow processing, Decreased initiation, Requires verbal cues, Requires tactile  cues  Blood pressure 127/77, pulse 95, temperature 98.7 F (37.1 C), temperature source Oral, resp. rate (!) 21, height 6\' 3"  (1.905 m), weight 82.3 kg, SpO2 97 %.  Nursing note and vitals reviewed. General: Alert and oriented x 3, No apparent distress HEENT: Head is normocephalic, atraumatic, PERRLA, EOMI, sclera anicteric, oral mucosa pink and moist, dentition intact, ext ear canals clear, [place] in nares and bilateral mittens in place.  Neck: Supple without JVD or lymphadenopathy Heart: Reg rate and rhythm. No murmurs rubs or gallops Chest: CTA bilaterally without wheezes, rales, or rhonchi; no distress Abdomen: Soft, non-tender, non-distended, bowel sounds positive. Extremities: No clubbing, cyanosis, or edema. Pulses are 2+ Skin: Clean and intact without signs of breakdown Neuro: Expressive > receptive deficits. Verbal output limited with occasional echolalia. He was able to chose name and place with choice of two. Unable to state age, DOB, month or recent holidays. Flat affect with delayed processing and internally distracted but able to redirect without difficulty. Tetraplegia --Right>left.  .  Musculoskeletal: Moved left lower extremity antigravity upon command, did not move any other extremities during testing.  Psych: Pt's affect is flat.  Results for orders placed or performed during the hospital encounter of 06/30/19 (from the past 24 hour(s))  Brain natriuretic peptide     Status: Abnormal   Collection Time: 07/09/19  9:15 AM  Result Value Ref Range   B Natriuretic Peptide 108.2 (H) 0.0 - 100.0 pg/mL  Glucose, capillary     Status: None   Collection Time: 07/09/19 11:34 AM  Result Value Ref Range   Glucose-Capillary 98 70 - 99 mg/dL  Glucose, capillary     Status: Abnormal   Collection Time: 07/09/19  3:31 PM  Result Value Ref Range   Glucose-Capillary 102 (H) 70 - 99 mg/dL  Glucose, capillary     Status: Abnormal   Collection Time: 07/09/19  8:27 PM  Result Value Ref  Range   Glucose-Capillary 115 (H) 70 - 99 mg/dL  Glucose, capillary     Status: Abnormal   Collection Time: 07/10/19 12:13 AM  Result Value Ref Range   Glucose-Capillary 109 (H) 70 - 99 mg/dL  Comprehensive metabolic panel     Status: Abnormal   Collection Time: 07/10/19  3:56 AM  Result Value Ref Range   Sodium 144 135 - 145 mmol/L   Potassium 3.4 (L) 3.5 - 5.1 mmol/L   Chloride 109 98 - 111 mmol/L   CO2 26 22 - 32 mmol/L   Glucose, Bld 120 (H) 70 - 99 mg/dL   BUN 23 (H) 6 - 20 mg/dL   Creatinine, Ser 09/07/19 0.61 - 1.24 mg/dL   Calcium 8.2 (L) 8.9 - 10.3 mg/dL   Total Protein 5.6 (L) 6.5 - 8.1 g/dL   Albumin 2.1 (L) 3.5 - 5.0 g/dL   AST 54 (H) 15 - 41 U/L   ALT 63 (H) 0 - 44 U/L   Alkaline Phosphatase 101 38 - 126 U/L   Total Bilirubin 0.7 0.3 - 1.2 mg/dL   GFR calc non Af Amer >60 >60 mL/min   GFR calc Af Amer >60 >60 mL/min   Anion gap 9 5 - 15  CBC     Status: Abnormal   Collection Time: 07/10/19  3:56 AM  Result Value Ref Range   WBC 13.2 (H) 4.0 - 10.5 K/uL   RBC 3.59 (L) 4.22 - 5.81 MIL/uL  Hemoglobin 10.9 (L) 13.0 - 17.0 g/dL   HCT 33.5 (L) 39.0 - 52.0 %   MCV 93.3 80.0 - 100.0 fL   MCH 30.4 26.0 - 34.0 pg   MCHC 32.5 30.0 - 36.0 g/dL   RDW 13.1 11.5 - 15.5 %   Platelets 396 150 - 400 K/uL   nRBC 0.0 0.0 - 0.2 %  Phosphorus     Status: None   Collection Time: 07/10/19  3:56 AM  Result Value Ref Range   Phosphorus 2.8 2.5 - 4.6 mg/dL  Magnesium     Status: None   Collection Time: 07/10/19  3:56 AM  Result Value Ref Range   Magnesium 1.7 1.7 - 2.4 mg/dL  Glucose, capillary     Status: Abnormal   Collection Time: 07/10/19  4:13 AM  Result Value Ref Range   Glucose-Capillary 113 (H) 70 - 99 mg/dL  Glucose, capillary     Status: Abnormal   Collection Time: 07/10/19  8:21 AM  Result Value Ref Range   Glucose-Capillary 107 (H) 70 - 99 mg/dL   DG CHEST PORT 1 VIEW  Result Date: 07/10/2019 CLINICAL DATA:  Status post recent cardiac arrest EXAM: PORTABLE CHEST 1  VIEW COMPARISON:  July 09, 2019 FINDINGS: Feeding tube tip is below the diaphragm. Central catheter tip is cavoatrial junction. No pneumothorax. There is widespread airspace opacity throughout the lungs bilaterally with slightly less consolidation in the bases. No new airspace opacity evident. Heart upper normal in size with pulmonary vascularity normal. No adenopathy. No bone lesions. IMPRESSION: Tube and catheter positions as described without pneumothorax. Widespread airspace opacity bilaterally. Question multifocal pneumonia versus noncardiogenic edema. Both entities may be present concurrently. Slightly less consolidation in the bases. No new opacity evident. Stable cardiac silhouette. Electronically Signed   By: Lowella Grip III M.D.   On: 07/10/2019 08:37   DG CHEST PORT 1 VIEW  Result Date: 07/09/2019 CLINICAL DATA:  39 year old male with respiratory failure and hypoxia. EXAM: PORTABLE CHEST 1 VIEW COMPARISON:  Chest radiograph dated 07/07/2019. FINDINGS: Interval removal of the enteric tube and placement of a feeding tube with tip beyond the inferior margin of the image. Right-sided PICC in similar position. Interval worsening of the diffuse bilateral confluent airspace opacities since the prior radiograph. No large pleural effusion or pneumothorax. Stable cardiac silhouette. No acute osseous pathology. IMPRESSION: Interval worsening of the diffuse bilateral confluent airspace opacities. Continued follow-up recommended. Electronically Signed   By: Anner Crete M.D.   On: 07/09/2019 03:52   DG Swallowing Func-Speech Pathology  Result Date: 07/09/2019 Objective Swallowing Evaluation: Type of Study: MBS-Modified Barium Swallow Study  Patient Details Name: TYRECK BELL MRN: [id number] Date of Birth: February 05, 1981 Today's Date: 07/09/2019 Time: SLP Start Time (ACUTE ONLY): 42 -SLP Stop Time (ACUTE ONLY): 1240 SLP Time Calculation (min) (ACUTE ONLY): 10 min Past Medical History: Past Medical  History: Diagnosis Date . Atrial fibrillation ([place])  . Polysubstance abuse ([place])  . Tobacco abuse  Past Surgical History: No past surgical history on file. HPI: 39 yo gentleman with history of afib, polysubstance use with witnessed cardiac arrest w/bystander followed by EMS CPR and vfib/vtach s/p defibrillation in the field.  CXR 1/10: "Interval worsening of the diffuse bilateral confluent airspace opacities"  Head CT 1/1: no acute findings  Subjective: pt awake, alert, pleasant, participative Assessment / Plan / Recommendation CHL IP CLINICAL IMPRESSIONS 07/09/2019 Clinical Impression Pt presents with functional oropharyngeal swallowing.  There was no penetration or aspiration with any consistency trials,  including thin liquid by straw.  With pill simulation there was oral statsis of tablet when given with thin liquid wash, and mastication of tablet when given with puree.  This is suspected to be 2/2 cognitive deficits rather than a true oral dysphagia.  Recommend regular texture diet with thin liquids.  Administer medications crushed with puree.  SLP to follow for diet tolerance. SLP Visit Diagnosis Dysphagia, unspecified (R13.10) Attention and concentration deficit following -- Frontal lobe and executive function deficit following -- Impact on safety and function No limitations   CHL IP TREATMENT RECOMMENDATION 07/09/2019 Treatment Recommendations Therapy as outlined in treatment plan below   Prognosis 07/09/2019 Prognosis for Safe Diet Advancement Good Barriers to Reach Goals -- Barriers/Prognosis Comment -- CH[facility] IP DIET RECOMMENDATION 07/09/2019 SLP Diet Recommendations Regular solids;Thin liquid Liquid Administration via Cup;No straw Medication Administration Crushed with puree Compensations Slow rate;Small sips/bites Postural Changes Seated upright at 90 degrees   CHL IP OTHER RECOMMENDATIONS 07/09/2019 Recommended Consults (No Data) Oral Care Recommendations Oral care BID Other Recommendations --   CH[facility] IP FOLLOW  UP RECOMMENDATIONS 07/09/2019 Follow up Recommendations (No Data)   CHL IP FREQUENCY AND DURATION 07/09/2019 Speech Therapy Frequency (ACUTE ONLY) min 2x/week Treatment Duration 2 weeks      CHL IP ORAL PHASE 07/09/2019 Oral Phase Impaired Oral - Pudding Teaspoon -- Oral - Pudding Cup -- Oral - Honey Teaspoon -- Oral - Honey Cup -- Oral - Nectar Teaspoon -- Oral - Nectar Cup -- Oral - Nectar Straw -- Oral - Thin Teaspoon -- Oral - Thin Cup WFL Oral - Thin Straw WFL Oral - Puree WFL Oral - Mech Soft -- Oral - Regular WFL Oral - Multi-Consistency -- Oral - Pill Holding of bolus;Reduced posterior propulsion Oral Phase - Comment --  CHL IP PHARYNGEAL PHASE 07/09/2019 Pharyngeal Phase Impaired Pharyngeal- Pudding Teaspoon -- Pharyngeal -- Pharyngeal- Pudding Cup -- Pharyngeal -- Pharyngeal- Honey Teaspoon -- Pharyngeal -- Pharyngeal- Honey Cup -- Pharyngeal -- Pharyngeal- Nectar Teaspoon -- Pharyngeal -- Pharyngeal- Nectar Cup -- Pharyngeal -- Pharyngeal- Nectar Straw -- Pharyngeal -- Pharyngeal- Thin Teaspoon -- Pharyngeal -- Pharyngeal- Thin Cup Delayed swallow initiation-vallecula Pharyngeal Material does not enter airway Pharyngeal- Thin Straw Delayed swallow initiation-pyriform sinuses Pharyngeal Material does not enter airway Pharyngeal- Puree Delayed swallow initiation-vallecula Pharyngeal Material does not enter airway Pharyngeal- Mechanical Soft -- Pharyngeal -- Pharyngeal- Regular Delayed swallow initiation-vallecula Pharyngeal Material does not enter airway Pharyngeal- Multi-consistency -- Pharyngeal -- Pharyngeal- Pill (No Data) Pharyngeal (No Data) Pharyngeal Comment --  CHL IP CERVICAL ESOPHAGEAL PHASE 07/09/2019 Cervical Esophageal Phase WFL Pudding Teaspoon -- Pudding Cup -- Honey Teaspoon -- Honey Cup -- Nectar Teaspoon -- Nectar Cup -- Nectar Straw -- Thin Teaspoon -- Thin Cup -- Thin Straw -- Puree -- Mechanical Soft -- Regular -- Multi-consistency -- Pill -- Cervical Esophageal Comment -- Kerrie PleasureLeigh E Borum,  MA, CCC-SLP Acute Rehabilitation Services Office: [phone] 07/09/2019, 1:50 PM                Assessment/Plan: Diagnosis: Encephalopathy 1. Does the need for close, 24 hr/day medical supervision in concert with the patient's rehab needs make it unreasonable for this patient to be served in a less intensive setting? Yes 2. Co-Morbidities requiring supervision/potential complications: afib, PFO, polysubstance abuse 3. Due to bladder management, bowel management, safety, skin/wound care, disease management, medication administration, pain management and patient education, does the patient require 24 hr/day rehab nursing? Yes 4. Does the patient require coordinated care of a physician, rehab nurse, therapy disciplines  of PT, OT, SLP to address physical and functional deficits in the context of the above medical diagnosis(es)? Yes Addressing deficits in the following areas: balance, endurance, locomotion, strength, transferring, bowel/bladder control, bathing, dressing, feeding, grooming, toileting, cognition, speech, language, swallowing and psychosocial support 5. Can the patient actively participate in an intensive therapy program of at least 3 hrs of therapy per day at least 5 days per week? Yes 6. The potential for patient to make measurable gains while on inpatient rehab is good 7. Anticipated functional outcomes upon discharge from inpatient rehab are min assist  with PT, min assist with OT, min assist with SLP. 8. Estimated rehab length of stay to reach the above functional goals is: 2-3 weeks 9. Anticipated discharge destination: Home 10. Overall Rehab/Functional Prognosis: good  RECOMMENDATIONS: This patient's condition is appropriate for continued rehabilitative care in the following setting: CIR Patient has agreed to participate in recommended program. N/A Note that insurance prior authorization may be required for reimbursement for recommended care.  Comment: Mr. Soledad would be a  good CIR candidate given his severely impaired mobility and cognition. Now tolerating regular diet, but receiving supplementary feeds per tube.   Jacquelynn Cree, PA-C 07/10/2019   I have personally performed a face to face diagnostic evaluation, including, but not limited to relevant history and physical exam findings, of this patient and developed relevant assessment and plan.  Additionally, I have reviewed and concur with the physician assistant's documentation above.  Sula Soda, MD

## 2019-07-10 NOTE — Progress Notes (Signed)
  Speech Language Pathology Treatment: Dysphagia  Patient Details Name: Pedro Gomez MRN: [id number] DOB: 07-26-1980 Today's Date: 07/10/2019 Time: 1020-1040 SLP Time Calculation (min) (ACUTE ONLY): 20 min  Assessment / Plan / Recommendation Clinical Impression  SLP followed up for PO diet tolerance s/p MBSS with regular thin liquid diet initiated 1/10. RN reports no difficulties with PO morning breakfast meal. Pt still receiving small amounts of alternative nutrition via cortrak with goals for removal as able. Pt assessed with regular, mixed textures, and thin liquids without overt s/sx of aspiration exhibited.  Pt with some mildly prolonged mastication of solid textures, likely in setting of cognitive deficits, however adequate oral clearance exhibited with increased time. Continue current diet with safe swallowing precautions, no further dysphagia needs identified.     HPI HPI: 39 yo gentleman with history of afib, polysubstance use with witnessed cardiac arrest w/bystander followed by EMS CPR and vfib/vtach s/p defibrillation in the field.  CXR 1/10: "Interval worsening of the diffuse bilateral confluent airspace opacities"  Head CT 1/1: no acute findings      SLP Plan  All goals met       Recommendations  Diet recommendations: Regular;Thin liquid Liquids provided via: Cup;Straw Medication Administration: Whole meds with puree(as tolerated ) Supervision: Full supervision/cueing for compensatory strategies;Staff to assist with self feeding Compensations: Slow rate;Small sips/bites;Minimize environmental distractions Postural Changes and/or Swallow Maneuvers: Seated upright 90 degrees;Upright 30-60 min after meal                General recommendations: Rehab consult Oral Care Recommendations: Oral care BID Follow up Recommendations: Inpatient Rehab Plan: All goals met       GO                Jakia Kennebrew E Rasha Ibe MA, CCC-SLP Acute Rehabilitation Services 07/10/2019,  10:44 AM

## 2019-07-10 NOTE — Progress Notes (Signed)
Pt received from 2H. VSS. CHG complete. Telemetry applied. Pt oriented to room and unit. Mother updated on pt new room.  Versie Starks, RN

## 2019-07-10 NOTE — Progress Notes (Signed)
Physical Therapy Treatment Patient Details Name: Pedro Gomez MRN: [id number] DOB: 09-22-1980 Today's Date: 07/10/2019    History of Present Illness Pt is a 39 y.o. male admitted 06/30/19 post-VF cardiac arrest with downtime ~10 minutes; pt with HR but GCS of 3 upon arrival; h/o alcohol abuse vs polysubstance abuse. ETT 1/1-1/9. Pt with bilateral paraparesis. Head CT with no acute findings. MRI C and T-spine rule out spinal cord infarct. PMH includes polysubance abuse, afib.    PT Comments    Pt admitted for above. He demonstrated improvement in functional mobility with all tasks today. He was able to complete bed mobility min guard with increased time, and transfers mod assist +2 as described below. Pt also required mod assist +1 for maintaining sitting balance during donning of socks. Pt's cognition impaired as described below, particularly with problem solving and memory. Pt unable to recall home situation at this time. Due to cognition, required frequent and repeated cueing throughout session in order to complete and stay on task. Continue to recommend CIR at this time. Pt would benefit from continued skilled PT intervention in order to address deficits.  Vitals during session:  125/86 sitting, 135/85 post-transfer 93-98% 6L - titrated to 5L HR 90-108 sitting    Follow Up Recommendations  CIR     Equipment Recommendations  Other (comment)(TBD next venue)    Recommendations for Other Services Rehab consult     Precautions / Restrictions Precautions Precautions: Fall Restrictions Weight Bearing Restrictions: No    Mobility  Bed Mobility Overal bed mobility: Needs Assistance Bed Mobility: Supine to Sit     Supine to sit: Min guard;HOB elevated     General bed mobility comments: close min guard for safety but pt able to manage B LE and trunk to sit EOB; mod/max cueing for sequencing to continue moving toward EOB  Transfers Overall transfer level: Needs  assistance Equipment used: 1 person hand held assist Transfers: Sit to/from Stand;Stand Pivot Transfers Sit to Stand: +2 physical assistance;Min assist Stand pivot transfers: Mod assist;+2 physical assistance       General transfer comment: min assist +2 for power up, mod +2 for stand pivot; cueing for sequencing and hand placement throughout both transfers; pt able to take pivoting steps to chair without LE facilitation but with cueing  Ambulation/Gait                 Stairs             Wheelchair Mobility    Modified Rankin (Stroke Patients Only)       Balance Overall balance assessment: Needs assistance Sitting-balance support: No upper extremity supported;Feet unsupported Sitting balance-Leahy Scale: Poor Sitting balance - Comments: mod A +1 for sitting balance while donning socks; posterior/right lean Postural control: Posterior lean;Right lateral lean Standing balance support: Single extremity supported;During functional activity Standing balance-Leahy Scale: Fair Standing balance comment: static balance fair, mild unsteadiness noted but no AD used                            Cognition Arousal/Alertness: Awake/alert Behavior During Therapy: WFL for tasks assessed/performed Overall Cognitive Status: Impaired/Different from baseline Area of Impairment: Orientation;Attention;Memory;Following commands;Safety/judgement;Awareness;Problem solving                 Orientation Level: Disoriented to;Time;Situation Current Attention Level: Sustained Memory: Decreased short-term memory Following Commands: Follows one step commands with increased time;Follows multi-step commands inconsistently;Follows multi-step commands with increased time Safety/Judgement: Decreased  awareness of safety;Decreased awareness of deficits Awareness: Emergent Problem Solving: Slow processing;Decreased initiation;Difficulty sequencing;Requires verbal cues;Requires tactile  cues General Comments: pt required frequent cueing to stay on task; spent time repeating questions or commands "we're going to stand. so we're going to stand." difficulty sequencing with donning socks; knew it was 12pm in the afternoon but was unable to state how he knew it was day and not night      Exercises      General Comments General comments (skin integrity, edema, etc.): hip and knee strength 5/5      Pertinent Vitals/Pain Pain Assessment: No/denies pain    Home Living                      Prior Function            PT Goals (current goals can now be found in the care plan section) Acute Rehab PT Goals Patient Stated Goal: "anything's better than doing nothing" PT Goal Formulation: With patient Time For Goal Achievement: 07/22/19 Potential to Achieve Goals: Good Progress towards PT goals: Progressing toward goals    Frequency    Min 3X/week      PT Plan Current plan remains appropriate    Co-evaluation PT/OT/SLP Co-Evaluation/Treatment: Yes Reason for Co-Treatment: Complexity of the patient's impairments (multi-system involvement);For patient/therapist safety;Necessary to address cognition/behavior during functional activity;To address functional/ADL transfers PT goals addressed during session: Mobility/safety with mobility;Balance;Strengthening/ROM OT goals addressed during session: ADL's and self-care;Proper use of Adaptive equipment and DME;Strengthening/ROM      AM-PAC PT "6 Clicks" Mobility   Outcome Measure  Help needed turning from your back to your side while in a flat bed without using bedrails?: A Little Help needed moving from lying on your back to sitting on the side of a flat bed without using bedrails?: A Little Help needed moving to and from a bed to a chair (including a wheelchair)?: A Lot Help needed standing up from a chair using your arms (e.g., wheelchair or bedside chair)?: A Little Help needed to walk in hospital room?: A  Lot Help needed climbing 3-5 steps with a railing? : A Lot 6 Click Score: 15    End of Session Equipment Utilized During Treatment: Gait belt;Oxygen Activity Tolerance: Patient tolerated treatment well Patient left: in chair;with call bell/phone within reach;with chair alarm set Nurse Communication: Mobility status PT Visit Diagnosis: Muscle weakness (generalized) (M62.81);Difficulty in walking, not elsewhere classified (R26.2);Apraxia (R48.2);Other symptoms and signs involving the nervous system (R29.898)     Time: 1062-6948 PT Time Calculation (min) (ACUTE ONLY): 43 min  Charges:  $Therapeutic Activity: 8-22 mins                     Wyvonna Plum, SPT    Verdene Lennert 07/10/2019, 2:31 PM

## 2019-07-10 NOTE — Evaluation (Signed)
Occupational Therapy Evaluation Patient Details Name: Pedro Gomez MRN: [id number] DOB: 1981/01/26 Today's Date: 07/10/2019    History of Present Illness Pt is a 39 y.o. male admitted 06/30/19 post-VF cardiac arrest with downtime ~10 minutes; pt with HR but GCS of 3 upon arrival; h/o alcohol abuse vs polysubstance abuse. ETT 1/1-1/9. Pt with bilateral paraparesis. Head CT with no acute findings. MRI C and T-spine rule out spinal cord infarct. PMH includes polysubance abuse, afib.   Clinical Impression   Pt admitted with above. He demonstrates the below listed deficits and will benefit from continued OT to maximize safety and independence with BADLs.  Pt presents to OT with significant cognitive deficits, impaired balance, and generalized weakness.  He currently requires mod - total A for ADLs and mod A +2 for functional mobility.  He is unable to provide info re: PLOF and no family available, although it is presumed he was fully independent.  Recommend CIR.       Follow Up Recommendations  CIR;Supervision/Assistance - 24 hour    Equipment Recommendations  None recommended by OT    Recommendations for Other Services Rehab consult     Precautions / Restrictions Precautions Precautions: Fall Restrictions Weight Bearing Restrictions: No      Mobility Bed Mobility Overal bed mobility: Needs Assistance Bed Mobility: Supine to Sit     Supine to sit: HOB elevated;Min assist     General bed mobility comments: close min guard to move toward EOB, and min A to scoot hips to EOB  for safety but pt able to manage B LE and trunk to sit EOB; mod/max cueing for sequencing to continue moving toward EOB  Transfers Overall transfer level: Needs assistance Equipment used: 2 person hand held assist Transfers: Sit to/from [employer] Sit to Stand: +2 physical assistance;Mod assist Stand pivot transfers: Mod assist;+2 physical assistance       General transfer comment: min  assist +2 for power up, mod +2 for stand pivot; cueing for sequencing and hand placement throughout both transfers; pt able to take pivoting steps to chair without LE facilitation but with cueing    Balance Overall balance assessment: Needs assistance Sitting-balance support: No upper extremity supported;Feet unsupported Sitting balance-Leahy Scale: Poor Sitting balance - Comments: mod A +1 for sitting balance while donning socks; posterior/right lean Postural control: Posterior lean;Right lateral lean Standing balance support: Single extremity supported;During functional activity Standing balance-Leahy Scale: Fair Standing balance comment: static balance fair, mild unsteadiness noted but no AD used                           ADL either performed or assessed with clinical judgement   ADL Overall ADL's : Needs assistance/impaired Eating/Feeding: Set up;Sitting   Grooming: Wash/dry hands;Wash/dry face;Moderate assistance;Sitting   Upper Body Bathing: Moderate assistance;Sitting   Lower Body Bathing: Maximal assistance;Sit to/from stand   Upper Body Dressing : Maximal assistance;Sitting   Lower Body Dressing: Moderate assistance;Sit to/from stand Lower Body Dressing Details (indicate cue type and reason): able to don socks while sitting EOB with mod A  Toilet Transfer: Moderate assistance;+2 for safety/equipment;+2 for physical assistance;Stand-pivot;BSC   Toileting- Clothing Manipulation and Hygiene: Total assistance;Sit to/from stand       Functional mobility during ADLs: Moderate assistance;+2 for safety/equipment;+2 for physical assistance       Vision         Perception Perception Perception Tested?: Yes   Praxis Praxis Praxis tested?: Within functional  limits    Pertinent Vitals/Pain Pain Assessment: No/denies pain     Hand Dominance     Extremity/Trunk Assessment Upper Extremity Assessment Upper Extremity Assessment: LUE  deficits/detail;Generalized weakness LUE Deficits / Details: Pt with difficulty grasping objects and frequently drops items from Lt hand.  He was slow to incorporate Lt UE into activity  LUE Coordination: decreased gross motor;decreased fine motor   Lower Extremity Assessment Lower Extremity Assessment: Defer to PT evaluation       Communication     Cognition Arousal/Alertness: Awake/alert Behavior During Therapy: WFL for tasks assessed/performed Overall Cognitive Status: Impaired/Different from baseline Area of Impairment: Orientation;Attention;Memory;Following commands;Safety/judgement;Awareness;Problem solving                 Orientation Level: Disoriented to;Time;Situation Current Attention Level: Sustained Memory: Decreased short-term memory Following Commands: Follows one step commands with increased time;Follows multi-step commands inconsistently;Follows multi-step commands with increased time Safety/Judgement: Decreased awareness of safety;Decreased awareness of deficits Awareness: Intellectual Problem Solving: Slow processing;Decreased initiation;Difficulty sequencing;Requires verbal cues;Requires tactile cues General Comments: pt required frequent cueing to stay on task; spent time repeating questions or commands "we're going to stand. so we're going to stand." difficulty sequencing with donning socks; knew it was 12pm in the afternoon but was unable to state how he knew it was day and not night   General Comments  VSs     Exercises     Shoulder Instructions      Home Living Family/patient expects to be discharged to:: Private residence                                 Additional Comments: Pt unable to provide info.  He is unsure who he lived with PTA, or where he lived       Prior Functioning/Environment Level of Independence: Independent        Comments: presumed independence based on age.  He does not remember if he was employed          OT Problem List: Decreased strength;Decreased activity tolerance;Impaired balance (sitting and/or standing);Decreased cognition;Decreased coordination;Decreased safety awareness;Decreased knowledge of use of DME or AE;Cardiopulmonary status limiting activity;Impaired UE functional use      OT Treatment/Interventions: Self-care/ADL training;Therapeutic exercise;Neuromuscular education;DME and/or AE instruction;Energy conservation;Therapeutic activities;Cognitive remediation/compensation;Patient/family education;Balance training    OT Goals(Current goals can be found in the care plan section) Acute Rehab OT Goals Patient Stated Goal: "anything's better than doing nothing" OT Goal Formulation: With patient Time For Goal Achievement: 07/24/19 Potential to Achieve Goals: Good  OT Frequency: Min 2X/week   Barriers to D/C: Decreased caregiver support          Co-evaluation PT/OT/SLP Co-Evaluation/Treatment: Yes Reason for Co-Treatment: Complexity of the patient's impairments (multi-system involvement);For patient/therapist safety;Necessary to address cognition/behavior during functional activity;To address functional/ADL transfers PT goals addressed during session: Mobility/safety with mobility;Balance;Strengthening/ROM OT goals addressed during session: ADL's and self-care      AM-PAC OT "6 Clicks" Daily Activity     Outcome Measure Help from another person eating meals?: A Little Help from another person taking care of personal grooming?: A Little Help from another person toileting, which includes using toliet, bedpan, or urinal?: A Lot Help from another person bathing (including washing, rinsing, drying)?: A Lot Help from another person to put on and taking off regular upper body clothing?: A Lot Help from another person to put on and taking off regular lower body clothing?: A Lot 6 Click  Score: 14   End of Session Equipment Utilized During Treatment: Oxygen;Gait belt Nurse  Communication: Mobility status  Activity Tolerance: Patient tolerated treatment well Patient left: in chair;with call bell/phone within reach;with chair alarm set  OT Visit Diagnosis: Unsteadiness on feet (R26.81);Cognitive communication deficit (R41.841)                Time: 8937-3428 OT Time Calculation (min): 38 min Charges:  OT General Charges $OT Visit: 1 Visit OT Evaluation $OT Eval Moderate Complexity: 1 Mod OT Treatments $Self Care/Home Management : 8-22 mins  Nilsa Nutting., OTR/L Acute Rehabilitation Services Pager [phone] Office [phone]   Lucille Passy M 07/10/2019, 4:53 PM

## 2019-07-10 NOTE — Progress Notes (Signed)
PULMONARY / CRITICAL CARE MEDICINE   Name: Pedro Gomez MRN: [id number] DOB: October 06, 1980    ADMISSION DATE:  06/30/2019 CONSULTATION DATE:  06/30/19  CHIEF COMPLAINT:  shock  HISTORY OF PRESENT ILLNESS:   39 yo gentleman with history of afib, polysubstance use with witnessed cardiac arrest w/bystander followed by EMS CPR and vfib/vtach s/p defibrillation in the field.    PAST MEDICAL HISTORY :  He  has a past medical history of Atrial fibrillation ([place]), Polysubstance abuse ([place]), and Tobacco abuse.  PAST SURGICAL HISTORY: He  has no past surgical history on file.  No Known Allergies  No current facility-administered medications on file prior to encounter.   Current Outpatient Medications on File Prior to Encounter  Medication Sig  . famotidine (PEPCID) 10 MG tablet Take 10 mg by mouth 2 (two) times daily as needed for heartburn or indigestion.    FAMILY HISTORY:  His He indicated that the status of his mother is unknown. He indicated that the status of his father is unknown.   SOCIAL HISTORY: He  reports that he has been smoking cigarettes. He has never used smokeless tobacco. He reports current alcohol use. He reports current drug use. Drug: Marijuana.  REVIEW OF SYSTEMS:   Intubated  SUBJECTIVE:  No acute events overnight, multiple bowel movements overnight  VITAL SIGNS: BP 130/78   Pulse 90   Temp 99.2 F (37.3 C) (Axillary)   Resp (!) 26   Ht 6\' 3"  (1.905 m)   Wt 82.3 kg   SpO2 98%   BMI 22.68 kg/m   HEMODYNAMICS:    VENTILATOR SETTINGS: FiO2 (%):  [40 %-68 %] 40 %  INTAKE / OUTPUT: I/O last 3 completed shifts: In: 1829.9 [P.O.:600; I.V.:206.9; NG/GT:1023] Out: 3400 [Urine:3400]  PHYSICAL EXAMINATION: General:  comfortable appearing  AAM Neuro:   Continues to improve, higher executive function testing today, knows similarities between apple and orange answers seeds and shape, moving extremities still weak but improving HEENT:   NCAT Cardiovascular:  RRR, systolic murmur likely flow murmur, no LE edema or JVD Lungs:  No increased wob, Slight decrease basilar sounds, broncial breath sounds LLL Abdomen: no longer distended Skin:  No rashes or bruising noted  LABS:  BMET Recent Labs  Lab 07/08/19 0411 07/09/19 0314 07/10/19 0356  NA 143 143 144  K 3.2* 3.8 3.4*  CL 111 110 109  CO2 22 23 26   BUN 28* 32* 23*  CREATININE 0.89 0.80 0.78  GLUCOSE 105* 123* 120*    Electrolytes Recent Labs  Lab 07/08/19 0411 07/09/19 0314 07/10/19 0356  CALCIUM 7.8* 8.3* 8.2*  MG 1.6* 2.1 1.7  PHOS 2.6 3.7 2.8    CBC Recent Labs  Lab 07/08/19 0411 07/09/19 0314 07/10/19 0356  WBC 7.3 14.2* 13.2*  HGB 10.0* 11.3* 10.9*  HCT 29.4* 34.1* 33.5*  PLT 198 296 396    Coag's No results for input(s): APTT, INR in the last 168 hours.  Sepsis Markers Recent Labs  Lab 07/03/19 1008  PROCALCITON 2.67    ABG No results for input(s): PHART, PCO2ART, PO2ART in the last 168 hours.  Liver Enzymes Recent Labs  Lab 07/08/19 0411 07/09/19 0314 07/10/19 0356  AST 49* 45* 54*  ALT 42 46* 63*  ALKPHOS 87 86 101  BILITOT 1.0 0.8 0.7  ALBUMIN 1.9* 2.2* 2.1*    Cardiac Enzymes No results for input(s): TROPONINI, PROBNP in the last 168 hours.  Glucose Recent Labs  Lab 07/09/19 0744 07/09/19 1134 07/09/19 1531  07/09/19 2027 07/10/19 0013 07/10/19 0413  GLUCAP 117* 98 102* 115* 109* 113*    Imaging DG Swallowing Func-Speech Pathology  Result Date: 07/09/2019 Objective Swallowing Evaluation: Type of Study: MBS-Modified Barium Swallow Study  Patient Details Name: Pedro Gomez MRN: [id number] Date of Birth: Apr 14, 1981 Today's Date: 07/09/2019 Time: SLP Start Time (ACUTE ONLY): 1230 -SLP Stop Time (ACUTE ONLY): 1240 SLP Time Calculation (min) (ACUTE ONLY): 10 min Past Medical History: Past Medical History: Diagnosis Date . Atrial fibrillation ([facility])  . Polysubstance abuse ([facility])  . Tobacco abuse  Past Surgical  History: No past surgical history on file. HPI: 39 yo gentleman with history of afib, polysubstance use with witnessed cardiac arrest w/bystander followed by EMS CPR and vfib/vtach s/p defibrillation in the field.  CXR 1/10: "Interval worsening of the diffuse bilateral confluent airspace opacities"  Head CT 1/1: no acute findings  Subjective: pt awake, alert, pleasant, participative Assessment / Plan / Recommendation CHL IP CLINICAL IMPRESSIONS 07/09/2019 Clinical Impression Pt presents with functional oropharyngeal swallowing.  There was no penetration or aspiration with any consistency trials, including thin liquid by straw.  With pill simulation there was oral statsis of tablet when given with thin liquid wash, and mastication of tablet when given with puree.  This is suspected to be 2/2 cognitive deficits rather than a true oral dysphagia.  Recommend regular texture diet with thin liquids.  Administer medications crushed with puree.  SLP to follow for diet tolerance. SLP Visit Diagnosis Dysphagia, unspecified (R13.10) Attention and concentration deficit following -- Frontal lobe and executive function deficit following -- Impact on safety and function No limitations   CHL IP TREATMENT RECOMMENDATION 07/09/2019 Treatment Recommendations Therapy as outlined in treatment plan below   Prognosis 07/09/2019 Prognosis for Safe Diet Advancement Good Barriers to Reach Goals -- Barriers/Prognosis Comment -- CHL IP DIET RECOMMENDATION 07/09/2019 SLP Diet Recommendations Regular solids;Thin liquid Liquid Administration via Cup;No straw Medication Administration Crushed with puree Compensations Slow rate;Small sips/bites Postural Changes Seated upright at 90 degrees   CHL IP OTHER RECOMMENDATIONS 07/09/2019 Recommended Consults (No Data) Oral Care Recommendations Oral care BID Other Recommendations --   CHL IP FOLLOW UP RECOMMENDATIONS 07/09/2019 Follow up Recommendations (No Data)   CHL IP FREQUENCY AND DURATION 07/09/2019 Speech  Therapy Frequency (ACUTE ONLY) min 2x/week Treatment Duration 2 weeks      CHL IP ORAL PHASE 07/09/2019 Oral Phase Impaired Oral - Pudding Teaspoon -- Oral - Pudding Cup -- Oral - Honey Teaspoon -- Oral - Honey Cup -- Oral - Nectar Teaspoon -- Oral - Nectar Cup -- Oral - Nectar Straw -- Oral - Thin Teaspoon -- Oral - Thin Cup WFL Oral - Thin Straw WFL Oral - Puree WFL Oral - Mech Soft -- Oral - Regular WFL Oral - Multi-Consistency -- Oral - Pill Holding of bolus;Reduced posterior propulsion Oral Phase - Comment --  CHL IP PHARYNGEAL PHASE 07/09/2019 Pharyngeal Phase Impaired Pharyngeal- Pudding Teaspoon -- Pharyngeal -- Pharyngeal- Pudding Cup -- Pharyngeal -- Pharyngeal- Honey Teaspoon -- Pharyngeal -- Pharyngeal- Honey Cup -- Pharyngeal -- Pharyngeal- Nectar Teaspoon -- Pharyngeal -- Pharyngeal- Nectar Cup -- Pharyngeal -- Pharyngeal- Nectar Straw -- Pharyngeal -- Pharyngeal- Thin Teaspoon -- Pharyngeal -- Pharyngeal- Thin Cup Delayed swallow initiation-vallecula Pharyngeal Material does not enter airway Pharyngeal- Thin Straw Delayed swallow initiation-pyriform sinuses Pharyngeal Material does not enter airway Pharyngeal- Puree Delayed swallow initiation-vallecula Pharyngeal Material does not enter airway Pharyngeal- Mechanical Soft -- Pharyngeal -- Pharyngeal- Regular Delayed swallow initiation-vallecula Pharyngeal Material does not enter airway  Pharyngeal- Multi-consistency -- Pharyngeal -- Pharyngeal- Pill (No Data) Pharyngeal (No Data) Pharyngeal Comment --  CHL IP CERVICAL ESOPHAGEAL PHASE 07/09/2019 Cervical Esophageal Phase WFL Pudding Teaspoon -- Pudding Cup -- Honey Teaspoon -- Honey Cup -- Nectar Teaspoon -- Nectar Cup -- Nectar Straw -- Thin Teaspoon -- Thin Cup -- Thin Straw -- Puree -- Mechanical Soft -- Regular -- Multi-consistency -- Pill -- Cervical Esophageal Comment -- Kerrie Pleasure, MA, CCC-SLP Acute Rehabilitation Services Office: [phone] 07/09/2019, 1:50 PM                 CULTURES: 06/30/19 Urine: NGTD Respiratory: NGTD Blood: NGTD ANTIBIOTICS: Cefepime 1/4--1/8  ASSESSMENT / PLAN:  NEUROLOGIC A: Encephalopathy: 2/2 poor cerebral perfusion from cardiac arrest.  He was intubated and sedated after cardiac arrest, and on targeted temperature management.   No seizure activity seen on initial EEG.  Twitching temporal muscle and abnormal eye movement noted 1/5 evening EEG repeated with no sz activity.  Normal MRI brain 1/5.  Following commands 1/6 but no extremity movement.  1/7 answering yes no questions with head shake.  4 extremity movement noted 1/8, still weak but improving 1/9.  1/10 continued improvement in movement and abstract thinking.  P:   -off precedex, mentating well, passed speech eval needs help with feeds because still weak discussed this with nurse  PULMONARY A: Acute respiratory failure: intubated during cardiac arrest, initially no infectious pneumonic or parapnuemonic abnormalities.  Aspiration event early am of 1/3 with new R mid to lower lobe opacification consistent with aspiration noted on x-ray along with decreased but still adequate oxygenation.  Treated with cefepime for aspiration PNA 1/3-1/8.  Extubated 1/9  On HFNC  P:   -continue aspiration prevention -continue chest physiotherapy, encouraged working with incentive spiro with the help of our nursing staff -Important to keep working with PT -On high flow requirement dow to 6L continue supplemental O2  INFECTIOUS A:   Aspiration event with pneumonitis, with subsequent PNA P:   -off cefepime and no infectious symptoms, continue monitoring   CARDIOVASCULAR A:  Cardiac arrest: likely cardiogenic shock, may have had some stunning from cocaine vs prolonged arrhythmia, ECHO delayed and shows recovery of EF, no RWMA seen consistent with his now minimally elevated troponin.  Off all vasopressors on 1/4 and normotensive  P:  -close electrolyte monitoring and  replacement -continuous cardiac monitoring  HEMATOLOGIC A:   Normocytic Anemia: Hgb trending down, asa stopped 1/7, some workup ordered 1/8 no cause identified,  Reticulocyte index 0.42 indicating hypoproliferation Leukocytosis: new on 1/10  P:  -no current s/s of infection continue to monitor  GASTROINTESTINAL A:   Ileus: has tube feeds, bowel regimen and prokinetics to stimulate motility, aggressively replacing electrolyte. cortrak placed 1/8, tube feeds restarted.  1/10-1/11 overnight multiple bowel movements, ileus has resolved. Vomiting episodes: no vomiting in last 72hrs P:   -reglan for one more day, continue bowel regimen, electrolyte replacement -continue transitioning off tube feeds, will need help with feeding -continue protonix  Dispo: transer pt to floor  FAMILY  - Updates: Calling and updating family today  - Inter-disciplinary family meet or Palliative Care meeting due by:  day 7  Pulmonary and Critical Care Medicine [facility] Pager: [phone] Thornell Mule MD PGY-3 Internal Medicine Pager # [phone]   07/10/2019, 7:53 AM

## 2019-07-10 NOTE — Progress Notes (Signed)
Nutrition Follow-up   DOCUMENTATION CODES:   Not applicable  INTERVENTION:    Ensure Enlive po TID, each supplement provides 350 kcal and 20 grams of protein  Magic cup TID with meals, each supplement provides 290 kcal and 9 grams of protein  If PO intake poor:  -Vital 1.5 @ 60 ml/hr via Cortrak  -30 ml Prostat BID  Provides: 2360 kcals, 120 grams protein, 1097 ml free water.   NUTRITION DIAGNOSIS:   Inadequate oral intake related to inability to eat as evidenced by NPO status.   Ongoing   GOAL:   Patient will meet greater than or equal to 90% of their needs   Diet advanced   MONITOR:   Vent status, TF tolerance, Labs, Weight trends  ASSESSMENT:   39 year old male presented with witnessed cardiac arrest. Admitted to Cardiac unit on normothermia protocol. PMH includes paroxysmal atrial fibrillation, polysubstance abuse.   1/3- vomited, aspiration, TF decreased to trickle  1/6-TF held for vomiting 1/4-rewarmed  1/8- gastric Cortrak placed 1/9- extubated   Pt discussed during ICU rounds and with RN.   Extremity movement improving but patient remains weak. Diet advanced to regular with thin liquids. Pt needs feeding assistance. Finished 50% of breakfast this am. Cortrak remains in place. Will hold TF and allow for PO intake. If PO remains stable may remove Cortrak. RD to add supplements.   Admission weight: 83.9 kg  Current weight: 82.3 kg   I/O: +1,221 ml since admit UOP: 2,500 ml x 24 hrs   Medications: folic acid, reglan, MVI with minerals, miralax, 40 mEq BID, senokot, thiamine  Labs: K 3.4 (L) LFTs elevated   Diet Order:   Diet Order            Diet regular Room service appropriate? Yes; Fluid consistency: Thin  Diet effective now              EDUCATION NEEDS:   Not appropriate for education at this time  Skin:  Skin Assessment: Reviewed RN Assessment  Last BM:  1/11  Height:   Ht Readings from Last 1 Encounters:  07/02/19 6\' 3"  (1.905  m)    Weight:   Wt Readings from Last 1 Encounters:  07/10/19 82.3 kg    Ideal Body Weight:  89.1 kg  BMI:  Body mass index is 22.68 kg/m.  Estimated Nutritional Needs:   Kcal:  2300-2500 kcal  Protein:  120-135 grams  Fluid:  >/= 2.3 L/day   09/07/19 RD, LDN Clinical Nutrition Pager # - [phone]

## 2019-07-11 DIAGNOSIS — T17908S Unspecified foreign body in respiratory tract, part unspecified causing other injury, sequela: Secondary | ICD-10-CM

## 2019-07-11 DIAGNOSIS — J9601 Acute respiratory failure with hypoxia: Principal | ICD-10-CM

## 2019-07-11 LAB — COMPREHENSIVE METABOLIC PANEL
ALT: 72 U/L — ABNORMAL HIGH (ref 0–44)
AST: 66 U/L — ABNORMAL HIGH (ref 15–41)
Albumin: 2.1 g/dL — ABNORMAL LOW (ref 3.5–5.0)
Alkaline Phosphatase: 104 U/L (ref 38–126)
Anion gap: 6 (ref 5–15)
BUN: 19 mg/dL (ref 6–20)
CO2: 26 mmol/L (ref 22–32)
Calcium: 8.3 mg/dL — ABNORMAL LOW (ref 8.9–10.3)
Chloride: 111 mmol/L (ref 98–111)
Creatinine, Ser: 0.81 mg/dL (ref 0.61–1.24)
GFR calc Af Amer: >60 mL/min (ref >60)
GFR calc non Af Amer: >60 mL/min (ref >60)
Glucose, Bld: 104 mg/dL — ABNORMAL HIGH (ref 70–99)
Potassium: 3.8 mmol/L (ref 3.5–5.1)
Sodium: 143 mmol/L (ref 135–145)
Total Bilirubin: 1 mg/dL (ref 0.3–1.2)
Total Protein: 5.6 g/dL — ABNORMAL LOW (ref 6.5–8.1)

## 2019-07-11 LAB — GLUCOSE, CAPILLARY
Glucose-Capillary: 102 mg/dL — ABNORMAL HIGH (ref 70–99)
Glucose-Capillary: 106 mg/dL — ABNORMAL HIGH (ref 70–99)
Glucose-Capillary: 107 mg/dL — ABNORMAL HIGH (ref 70–99)
Glucose-Capillary: 111 mg/dL — ABNORMAL HIGH (ref 70–99)
Glucose-Capillary: 116 mg/dL — ABNORMAL HIGH (ref 70–99)
Glucose-Capillary: 99 mg/dL (ref 70–99)

## 2019-07-11 LAB — CBC
HCT: 31 % — ABNORMAL LOW (ref 39.0–52.0)
Hemoglobin: 10.2 g/dL — ABNORMAL LOW (ref 13.0–17.0)
MCH: 30.4 pg (ref 26.0–34.0)
MCHC: 32.9 g/dL (ref 30.0–36.0)
MCV: 92.3 fL (ref 80.0–100.0)
Platelets: 458 10*3/uL — ABNORMAL HIGH (ref 150–400)
RBC: 3.36 MIL/uL — ABNORMAL LOW (ref 4.22–5.81)
RDW: 13 % (ref 11.5–15.5)
WBC: 11.6 10*3/uL — ABNORMAL HIGH (ref 4.0–10.5)
nRBC: 0 % (ref 0.0–0.2)

## 2019-07-11 LAB — PHOSPHORUS: Phosphorus: 2.4 mg/dL — ABNORMAL LOW (ref 2.5–4.6)

## 2019-07-11 LAB — MAGNESIUM: Magnesium: 1.6 mg/dL — ABNORMAL LOW (ref 1.7–2.4)

## 2019-07-11 MED ORDER — POTASSIUM PHOSPHATES 15 MMOLE/5ML IV SOLN
30.0000 mmol | Freq: Once | INTRAVENOUS | Status: AC
  Administered 2019-07-11: 09:00:00 30 mmol via INTRAVENOUS
  Filled 2019-07-11: qty 10

## 2019-07-11 MED ORDER — MAGNESIUM SULFATE 4 GM/100ML IV SOLN
4.0000 g | Freq: Once | INTRAVENOUS | Status: AC
  Administered 2019-07-11: 09:00:00 4 g via INTRAVENOUS
  Filled 2019-07-11: qty 100

## 2019-07-11 MED ORDER — FOLIC ACID 1 MG PO TABS
1.0000 mg | ORAL_TABLET | Freq: Every day | ORAL | Status: DC
  Administered 2019-07-12 – 2019-07-14 (×3): 1 mg via ORAL
  Filled 2019-07-11 (×3): qty 1

## 2019-07-11 MED ORDER — PANTOPRAZOLE SODIUM 40 MG PO TBEC
40.0000 mg | DELAYED_RELEASE_TABLET | Freq: Every day | ORAL | Status: DC
  Administered 2019-07-11 – 2019-07-14 (×3): 40 mg via ORAL
  Filled 2019-07-11 (×5): qty 1

## 2019-07-11 NOTE — Plan of Care (Signed)
  Problem: Coping: Goal: Level of anxiety will decrease Outcome: Progressing   Problem: Activity: Goal: Risk for activity intolerance will decrease Outcome: Not Progressing   

## 2019-07-11 NOTE — Progress Notes (Signed)
PROGRESS NOTE    Pedro Gomez  VOH:[id number] DOB: [birth year] DOA: 06/30/2019 PCP: Patient, No Pcp Per   Brief Narrative:  39 yo gentleman with history of afib, polysubstance use with witnessed cardiac arrest w/bystander followed by EMS CPR and vfib/vtach s/p defibrillation in the field   Assessment & Plan:   Active Problems:   Cardiac arrest (HCC)   Encephalopathy acute   Cocaine abuse (HCC)   Polysubstance abuse (HCC)   Paroxysmal atrial fibrillation (HCC)   PFO (patent foramen ovale)   Current smoker  1 acute respiratory failure with hypoxia secondary to cardiac arrest/aspiration pneumonia Patient was intubated during cardiac arrest and admitted to the critical care service.  Initially there was no infectious or parapneumonic abnormalities noted on radiography.  Patient noted to have an aspiration event the morning of 07/02/2019 with a new right mid to lower lobe opacification consistent with aspiration along with hypoxia.  Patient status post full course of cefepime for aspiration pneumonia from 07/02/2019-07/07/2019.  Patient extubated on 07/08/2019 and currently on 96% on 2 L nasal cannula.  Continue aspiration precautions, chest PT.  Continue to wean O2.  Supportive care.  2.  Acute metabolic encephalopathy Likely secondary to decreased cerebral perfusion from cardiac arrest.  Patient noted to be intubated and sedated after cardiac arrest and was placed on targeted temperature management.  Initial EEG done was negative for any seizure activity.  Patient noted to have some twitching temporal muscle and abnormal eye movement on 07/04/2019 and repeat EEG done showed no seizure activity.  MRI brain done on 07/04/2019 with no acute abnormalities noted.  Patient was placed on the Precedex drip which has subsequently been discontinued.  Patient seen by neurology.  Patient being followed by speech.  Supportive care.  3.  Cardiac arrest/cardiogenic shock Felt secondary to stenting from cocaine  versus prolonged arrhythmia.  2D echo done with a EF of 50 to 55%, mildly increased LVH, no wall motion abnormalities.  Keep potassium greater than 4.  Keep magnesium greater than 2.  4. Ileus Status post tube feeds.  Patient on bowel regimen of prokinetics.  Core track placed 07/07/2019.  Tube feeds resumed.  Patient with multiple bowel movements with flatus.  Ileus resolved.  Continue cortrack for now and once adequate oral intake could likely discontinue.  5.  Normocytic anemia Hemoglobin noted to be trending down during the hospitalization and aspirin discontinued on 07/06/2019.  Patient with no overt bleeding.  Hemoglobin stable at 10.2.  Follow.    DVT prophylaxis: Lovenox Code Status: Full Family Communication: Updated patient.  No family at bedside. Disposition Plan: Family refusing CIR per CIR note.  Home with home health when clinically stable.   Consultants:   Neurology: Dr.Aroor 07/05/2019  Inpatient rehab Dr. Carlis Abbott 07/10/2019  Procedures:   2D echo 07/02/2019  MRI brain 07/05/2019  MRI C and T-spine 07/06/2019  CT head 06/30/2019  EEG x2  Cortrack 07/07/2019  Antimicrobials:   IV cefepime 07/02/2019>>>> 07/07/2019     Subjective: Patient laying in bed watching television.  Patient oriented to self.  Knows he is in [place] and in [facility].  Not sure what kind of place disease.  Not sure what year it is.  Knows it is January.  Denies any chest pain or shortness of breath.  Objective: Vitals:   07/11/19 0137 07/11/19 0341 07/11/19 0749 07/11/19 0915  BP:  130/75 124/85   Pulse:      Resp: (!) 29 (!) 26 (!) 22 Marland Kitchen)  22  Temp:  98.5 F (36.9 C) 99.3 F (37.4 C)   TempSrc:  Oral Oral   SpO2: 95% 95% 97% 95%  Weight:      Height:        Intake/Output Summary (Last 24 hours) at 07/11/2019 1123 Last data filed at 07/11/2019 1000 Gross per 24 hour  Intake 605 ml  Output 1800 ml  Net -1195 ml   Filed Weights   07/08/19 0700 07/09/19 0600  07/10/19 0500  Weight: 78.8 kg 75.6 kg 82.3 kg    Examination:  General exam: Appears calm and comfortable. Cortrack. Respiratory system: Clear to auscultation. Respiratory effort normal. Cardiovascular system: S1 & S2 heard, RRR. No JVD, murmurs, rubs, gallops or clicks. No pedal edema. Gastrointestinal system: Abdomen is nondistended, soft and nontender. No organomegaly or masses felt. Normal bowel sounds heard. Central nervous system: Alert and oriented to self and place.  Moving extremities spontaneously.  Extremities: Symmetric 5 x 5 power. Skin: No rashes, lesions or ulcers Psychiatry: Judgement and insight appear normal. Mood & affect appropriate.     Data Reviewed: I have personally reviewed following labs and imaging studies  CBC: Recent Labs  Lab 07/07/19 0246 07/08/19 0411 07/09/19 0314 07/10/19 0356 07/11/19 0411  WBC 8.3 7.3 14.2* 13.2* 11.6*  HGB 10.6* 10.0* 11.3* 10.9* 10.2*  HCT 30.6* 29.4* 34.1* 33.5* 31.0*  MCV 90.0 92.5 92.2 93.3 92.3  PLT 166 198 296 396 458*   Basic Metabolic Panel: Recent Labs  Lab 07/07/19 0246 07/08/19 0411 07/09/19 0314 07/10/19 0356 07/11/19 0411  NA 141 143 143 144 143  K 3.3* 3.2* 3.8 3.4* 3.8  CL 106 111 110 109 111  CO2 26 22 23 26 26   GLUCOSE 117* 105* 123* 120* 104*  BUN 25* 28* 32* 23* 19  CREATININE 0.97 0.89 0.80 0.78 0.81  CALCIUM 8.3* 7.8* 8.3* 8.2* 8.3*  MG 1.9 1.6* 2.1 1.7 1.6*  PHOS 2.2* 2.6 3.7 2.8 2.4*   GFR: Estimated Creatinine Clearance: 143.9 mL/min (by C-G formula based on SCr of 0.81 mg/dL). Liver Function Tests: Recent Labs  Lab 07/07/19 0246 07/08/19 0411 07/09/19 0314 07/10/19 0356 07/11/19 0411  AST 35 49* 45* 54* 66*  ALT 32 42 46* 63* 72*  ALKPHOS 52 87 86 101 104  BILITOT 1.4* 1.0 0.8 0.7 1.0  PROT 5.5* 5.4* 5.7* 5.6* 5.6*  ALBUMIN 2.0* 1.9* 2.2* 2.1* 2.1*   No results for input(s): LIPASE, AMYLASE in the last 168 hours. Recent Labs  Lab 07/05/19 1544  AMMONIA 11    Coagulation Profile: No results for input(s): INR, PROTIME in the last 168 hours. Cardiac Enzymes: Recent Labs  Lab 07/05/19 1054 07/06/19 0609  CKTOTAL 982* 633*   BNP (last 3 results) No results for input(s): PROBNP in the last 8760 hours. HbA1C: No results for input(s): HGBA1C in the last 72 hours. CBG: Recent Labs  Lab 07/10/19 1657 07/10/19 2024 07/10/19 2319 07/11/19 0339 07/11/19 0752  GLUCAP 104* 104* 105* 107* 111*   Lipid Profile: No results for input(s): CHOL, HDL, LDLCALC, TRIG, CHOLHDL, LDLDIRECT in the last 72 hours. Thyroid Function Tests: No results for input(s): TSH, T4TOTAL, FREET4, T3FREE, THYROIDAB in the last 72 hours. Anemia Panel: No results for input(s): VITAMINB12, FOLATE, FERRITIN, TIBC, IRON, RETICCTPCT in the last 72 hours. Sepsis Labs: No results for input(s): PROCALCITON, LATICACIDVEN in the last 168 hours.  Recent Results (from the past 240 hour(s))  Culture, respiratory (non-expectorated)     Status: None  Collection Time: 07/03/19 11:52 AM   Specimen: Tracheal Aspirate; Respiratory  Result Value Ref Range Status   Specimen Description TRACHEAL ASPIRATE  Final   Special Requests NONE  Final   Gram Stain   Final    ABUNDANT WBC PRESENT, PREDOMINANTLY PMN ABUNDANT GRAM NEGATIVE RODS ABUNDANT GRAM POSITIVE COCCI FEW GRAM POSITIVE RODS    Culture   Final    Consistent with normal respiratory flora. Performed at [facility], [address] [place], [place] [address]    Report Status 07/06/2019 FINAL  Final         Radiology Studies: DG CHEST PORT 1 VIEW  Result Date: 07/10/2019 CLINICAL DATA:  Status post recent cardiac arrest EXAM: PORTABLE CHEST 1 VIEW COMPARISON:  July 09, 2019 FINDINGS: Feeding tube tip is below the diaphragm. Central catheter tip is cavoatrial junction. No pneumothorax. There is widespread airspace opacity throughout the lungs bilaterally with slightly less consolidation in the bases. No new  airspace opacity evident. Heart upper normal in size with pulmonary vascularity normal. No adenopathy. No bone lesions. IMPRESSION: Tube and catheter positions as described without pneumothorax. Widespread airspace opacity bilaterally. Question multifocal pneumonia versus noncardiogenic edema. Both entities may be present concurrently. Slightly less consolidation in the bases. No new opacity evident. Stable cardiac silhouette. Electronically Signed   By: Bretta Bang III M.D.   On: 07/10/2019 08:37   DG Swallowing Func-Speech Pathology  Result Date: 07/09/2019 Objective Swallowing Evaluation: Type of Study: MBS-Modified Barium Swallow Study  Patient Details Name: ELKIN BELFIELD MRN: [id number] Date of Birth: Aug 17, 1980 Today's Date: 07/09/2019 Time: SLP Start Time (ACUTE ONLY): 1230 -SLP Stop Time (ACUTE ONLY): 1240 SLP Time Calculation (min) (ACUTE ONLY): 10 min Past Medical History: Past Medical History: Diagnosis Date . Atrial fibrillation (HCC)  . Polysubstance abuse (HCC)  . Tobacco abuse  Past Surgical History: No past surgical history on file. HPI: 39 yo gentleman with history of afib, polysubstance use with witnessed cardiac arrest w/bystander followed by EMS CPR and vfib/vtach s/p defibrillation in the field.  CXR 1/10: "Interval worsening of the diffuse bilateral confluent airspace opacities"  Head CT 1/1: no acute findings  Subjective: pt awake, alert, pleasant, participative Assessment / Plan / Recommendation CHL IP CLINICAL IMPRESSIONS 07/09/2019 Clinical Impression Pt presents with functional oropharyngeal swallowing.  There was no penetration or aspiration with any consistency trials, including thin liquid by straw.  With pill simulation there was oral statsis of tablet when given with thin liquid wash, and mastication of tablet when given with puree.  This is suspected to be 2/2 cognitive deficits rather than a true oral dysphagia.  Recommend regular texture diet with thin liquids.  Administer  medications crushed with puree.  SLP to follow for diet tolerance. SLP Visit Diagnosis Dysphagia, unspecified (R13.10) Attention and concentration deficit following -- Frontal lobe and executive function deficit following -- Impact on safety and function No limitations   CHL IP TREATMENT RECOMMENDATION 07/09/2019 Treatment Recommendations Therapy as outlined in treatment plan below   Prognosis 07/09/2019 Prognosis for Safe Diet Advancement Good Barriers to Reach Goals -- Barriers/Prognosis Comment -- CHL IP DIET RECOMMENDATION 07/09/2019 SLP Diet Recommendations Regular solids;Thin liquid Liquid Administration via Cup;No straw Medication Administration Crushed with puree Compensations Slow rate;Small sips/bites Postural Changes Seated upright at 90 degrees   CHL IP OTHER RECOMMENDATIONS 07/09/2019 Recommended Consults (No Data) Oral Care Recommendations Oral care BID Other Recommendations --   CHL IP FOLLOW UP RECOMMENDATIONS 07/09/2019 Follow up Recommendations (No Data)  CHL IP FREQUENCY AND DURATION 07/09/2019 Speech Therapy Frequency (ACUTE ONLY) min 2x/week Treatment Duration 2 weeks      CHL IP ORAL PHASE 07/09/2019 Oral Phase Impaired Oral - Pudding Teaspoon -- Oral - Pudding Cup -- Oral - Honey Teaspoon -- Oral - Honey Cup -- Oral - Nectar Teaspoon -- Oral - Nectar Cup -- Oral - Nectar Straw -- Oral - Thin Teaspoon -- Oral - Thin Cup WFL Oral - Thin Straw WFL Oral - Puree WFL Oral - Mech Soft -- Oral - Regular WFL Oral - Multi-Consistency -- Oral - Pill Holding of bolus;Reduced posterior propulsion Oral Phase - Comment --  CHL IP PHARYNGEAL PHASE 07/09/2019 Pharyngeal Phase Impaired Pharyngeal- Pudding Teaspoon -- Pharyngeal -- Pharyngeal- Pudding Cup -- Pharyngeal -- Pharyngeal- Honey Teaspoon -- Pharyngeal -- Pharyngeal- Honey Cup -- Pharyngeal -- Pharyngeal- Nectar Teaspoon -- Pharyngeal -- Pharyngeal- Nectar Cup -- Pharyngeal -- Pharyngeal- Nectar Straw -- Pharyngeal -- Pharyngeal- Thin Teaspoon -- Pharyngeal  -- Pharyngeal- Thin Cup Delayed swallow initiation-vallecula Pharyngeal Material does not enter airway Pharyngeal- Thin Straw Delayed swallow initiation-pyriform sinuses Pharyngeal Material does not enter airway Pharyngeal- Puree Delayed swallow initiation-vallecula Pharyngeal Material does not enter airway Pharyngeal- Mechanical Soft -- Pharyngeal -- Pharyngeal- Regular Delayed swallow initiation-vallecula Pharyngeal Material does not enter airway Pharyngeal- Multi-consistency -- Pharyngeal -- Pharyngeal- Pill (No Data) Pharyngeal (No Data) Pharyngeal Comment --  CHL IP CERVICAL ESOPHAGEAL PHASE 07/09/2019 Cervical Esophageal Phase WFL Pudding Teaspoon -- Pudding Cup -- Honey Teaspoon -- Honey Cup -- Nectar Teaspoon -- Nectar Cup -- Nectar Straw -- Thin Teaspoon -- Thin Cup -- Thin Straw -- Puree -- Mechanical Soft -- Regular -- Multi-consistency -- Pill -- Cervical Esophageal Comment -- Celedonio Savage, MA, CCC-SLP Acute Rehabilitation Services Office: [phone] 07/09/2019, 1:50 PM                   Scheduled Meds: . bisacodyl  10 mg Rectal Once  . Chlorhexidine Gluconate Cloth  6 each Topical Daily  . enoxaparin (LOVENOX) injection  40 mg Subcutaneous Q24H  . feeding supplement (ENSURE ENLIVE)  237 mL Oral TID BM  . feeding supplement (PRO-STAT SUGAR FREE 64)  60 mL Per Tube BID  . folic acid  1 mg Intravenous Daily  . free water  100 mL Per Tube Q6H  . metoCLOPramide (REGLAN) injection  10 mg Intravenous Q6H  . multivitamin with minerals  1 tablet Oral Daily  . nicotine  14 mg Transdermal Daily  . pantoprazole  40 mg Oral Q0600  . polyethylene glycol  17 g Per Tube Daily  . sennosides  10 mL Oral QHS  . sodium chloride flush  10-40 mL Intracatheter Q12H  . thiamine  100 mg Oral Daily   Continuous Infusions: . sodium chloride 10 mL/hr at 07/09/19 0200  . sodium chloride 250 mL (07/08/19 0845)  . feeding supplement (VITAL 1.5 CAL) Stopped (07/10/19 1207)  . potassium PHOSPHATE IVPB (in  mmol) 30 mmol (07/11/19 0918)     LOS: 10 days    Time spent: 40 minutes    Irine Seal, MD [facility]  If 7PM-7AM, please contact night-coverage [email or web address] 07/11/2019, 11:23 AM

## 2019-07-11 NOTE — Progress Notes (Signed)
Inpatient Rehabilitation-Admissions Coordinator   Was notified by the pt's mother his AM that she and his family have decided to forgo IP Rehab due to potential cost. The pt's mother would like for him to go home and get rehab. I explained that because he is uninsured he is not likely to receive any follow up Home Health services. We also discussed that his cognition is very impaired and he would greatly benefit from specialized care for this. She verbalized understanding but still wants to pursue home once he is medically ready; she requested therapy provide HEPs with instructions so she can assist him in his recovery at home. I have notified TOC team, PT, OT, and SLP of family request to see what can be done in terms of education and instruction if pt were to DC home.   Will continue to follow and will continue to stay in contact with pt's mother/family, should they change their mind and want CIR again.   Please call if questions.   Cheri Rous, OTR/L  Rehab Admissions Coordinator  [phone] 07/11/2019 10:51 AM

## 2019-07-11 NOTE — Progress Notes (Signed)
Physical Therapy Treatment Patient Details Name: Pedro Gomez MRN: [id number] DOB: 11-26-1980 Today's Date: 07/11/2019    History of Present Illness Pt is a 39 y.o. male admitted 06/30/19 post-VF cardiac arrest with downtime ~10 minutes; pt with HR but GCS of 3 upon arrival; h/o alcohol abuse vs polysubstance abuse. ETT 1/1-1/9. Pt with bilateral paraparesis. Head CT with no acute findings. MRI C and T-spine rule out spinal cord infarct. PMH includes polysubance abuse, afib.    PT Comments    Pt in bed upon arrival of PT, agreeable to session this pm focused on progressing strength and stability. The pt was able to improve stability and decrease reliance on external support with repetition of sit-stands. He was also able to demo improved proprioception and stability with dynamic standing wt shifts and toe taps with only single UE support. The pt continues to present with significant deficits in mobility, stability, and cognition due to above dx, and will continue to benefit from skilled PT to maximize rehab outcomes and future independence. Per CM, the pt's mother is declining CIR, and therefore, although I think the pt is still an excellent candidate for continued in-patient rehab, we will attempt to equip the pt and his family for continued rehab at home. The pt will need max HH services due to deficits described above. PT will continue to follow acutely.    Follow Up Recommendations  Home health PT;Supervision/Assistance - 24 hour(pt family declining CIR at this time, recommend max HH services)     Equipment Recommendations  Wheelchair (measurements PT);Wheelchair cushion (measurements PT);3in1 (PT);Rolling walker with 5" wheels(pt family declining CIR, will need selected equipment to mobilize at home safely)    Recommendations for Other Services       Precautions / Restrictions Precautions Precautions: Fall Restrictions Weight Bearing Restrictions: No    Mobility  Bed  Mobility Overal bed mobility: Needs Assistance Bed Mobility: Supine to Sit     Supine to sit: HOB elevated;Supervision     General bed mobility comments: pt able to come to sitting EOB without assist, additional cueing for moving hips to EOB, but pt able to come to sitting and use BUE for stability  Transfers Overall transfer level: Needs assistance Equipment used: Ambulation equipment used Transfers: Sit to/from Stand Sit to Stand: Min guard         General transfer comment: min guard for safety with power up and lower in stedy with assist of 1. Pt was able to decrease reliance on BUE with reps and elevated surface. Benefits from cues to reach hips back during lower  Ambulation/Gait             General Gait Details: not attempted today   Stairs             Wheelchair Mobility    Modified Rankin (Stroke Patients Only)       Balance Overall balance assessment: Needs assistance Sitting-balance support: Feet unsupported;Bilateral upper extremity supported Sitting balance-Leahy Scale: Fair Sitting balance - Comments: pt able to maintain sitting EOB with use of BUE for stability, no LOB   Standing balance support: Single extremity supported;During functional activity Standing balance-Leahy Scale: Fair Standing balance comment: static balance fair, pt in stedy for safety with assist of 1, able to perform wt shift and reaching with single UE support                            Cognition Arousal/Alertness: Awake/alert Behavior  During Therapy: WFL for tasks assessed/performed Overall Cognitive Status: Impaired/Different from baseline Area of Impairment: Orientation;Attention;Memory;Following commands;Safety/judgement;Awareness;Problem solving                 Orientation Level: Disoriented to;Time;Situation Current Attention Level: Sustained Memory: Decreased short-term memory Following Commands: Follows one step commands with increased  time;Follows multi-step commands inconsistently;Follows multi-step commands with increased time Safety/Judgement: Decreased awareness of safety;Decreased awareness of deficits Awareness: Intellectual Problem Solving: Slow processing;Decreased initiation;Difficulty sequencing;Requires verbal cues;Requires tactile cues General Comments: Pt with improved participation and processing of instructions in therapy session. However, pt continues to have difficulty identifying objects, for example, he was unable to place his hand on the bed without multimodal cues.      Exercises Other Exercises Other Exercises: Sit-Stand: 3 x 5 through session Other Exercises: standing reaching, standing wt shift with toe taps    General Comments        Pertinent Vitals/Pain Pain Assessment: No/denies pain Pain Intervention(s): Limited activity within patient's tolerance;Monitored during session    Home Living                      Prior Function            PT Goals (current goals can now be found in the care plan section) Acute Rehab PT Goals Patient Stated Goal: "anything's better than doing nothing" PT Goal Formulation: With patient Time For Goal Achievement: 07/22/19 Potential to Achieve Goals: Good Progress towards PT goals: Progressing toward goals    Frequency    Min 5X/week      PT Plan Discharge plan needs to be updated;Frequency needs to be updated    Co-evaluation              AM-PAC PT "6 Clicks" Mobility   Outcome Measure  Help needed turning from your back to your side while in a flat bed without using bedrails?: A Little Help needed moving from lying on your back to sitting on the side of a flat bed without using bedrails?: A Little Help needed moving to and from a bed to a chair (including a wheelchair)?: A Lot Help needed standing up from a chair using your arms (e.g., wheelchair or bedside chair)?: A Little Help needed to walk in hospital room?: A Lot Help  needed climbing 3-5 steps with a railing? : A Lot 6 Click Score: 15    End of Session Equipment Utilized During Treatment: Gait belt Activity Tolerance: Patient tolerated treatment well Patient left: in bed;with call bell/phone within reach;with nursing/sitter in room Nurse Communication: Mobility status PT Visit Diagnosis: Muscle weakness (generalized) (M62.81);Difficulty in walking, not elsewhere classified (R26.2);Apraxia (R48.2);Other symptoms and signs involving the nervous system (Q11.941)     Time: 7408-1448 PT Time Calculation (min) (ACUTE ONLY): 41 min  Charges:  $Therapeutic Exercise: 8-22 mins $Neuromuscular Re-education: 23-37 mins                     Hardie Pulley, DPT   Acute Rehabilitation Department Pager #: [phone]   Otho Bellows 07/11/2019, 4:32 PM

## 2019-07-12 LAB — MAGNESIUM: Magnesium: 1.7 mg/dL (ref 1.7–2.4)

## 2019-07-12 LAB — CBC WITH DIFFERENTIAL/PLATELET
Abs Immature Granulocytes: 0.16 10*3/uL — ABNORMAL HIGH (ref 0.00–0.07)
Basophils Absolute: 0 10*3/uL (ref 0.0–0.1)
Basophils Relative: 0 %
Eosinophils Absolute: 0.6 10*3/uL — ABNORMAL HIGH (ref 0.0–0.5)
Eosinophils Relative: 7 %
HCT: 31.1 % — ABNORMAL LOW (ref 39.0–52.0)
Hemoglobin: 10.6 g/dL — ABNORMAL LOW (ref 13.0–17.0)
Immature Granulocytes: 2 %
Lymphocytes Relative: 14 %
Lymphs Abs: 1.3 10*3/uL (ref 0.7–4.0)
MCH: 30.7 pg (ref 26.0–34.0)
MCHC: 34.1 g/dL (ref 30.0–36.0)
MCV: 90.1 fL (ref 80.0–100.0)
Monocytes Absolute: 0.7 10*3/uL (ref 0.1–1.0)
Monocytes Relative: 8 %
Neutro Abs: 6.1 10*3/uL (ref 1.7–7.7)
Neutrophils Relative %: 69 %
Platelets: 538 10*3/uL — ABNORMAL HIGH (ref 150–400)
RBC: 3.45 MIL/uL — ABNORMAL LOW (ref 4.22–5.81)
RDW: 12.7 % (ref 11.5–15.5)
WBC: 8.9 10*3/uL (ref 4.0–10.5)
nRBC: 0 % (ref 0.0–0.2)

## 2019-07-12 LAB — GLUCOSE, CAPILLARY
Glucose-Capillary: 102 mg/dL — ABNORMAL HIGH (ref 70–99)
Glucose-Capillary: 92 mg/dL (ref 70–99)
Glucose-Capillary: 128 mg/dL — ABNORMAL HIGH (ref 70–99)
Glucose-Capillary: 89 mg/dL (ref 70–99)
Glucose-Capillary: 92 mg/dL (ref 70–99)

## 2019-07-12 LAB — COMPREHENSIVE METABOLIC PANEL
ALT: 114 U/L — ABNORMAL HIGH (ref 0–44)
AST: 103 U/L — ABNORMAL HIGH (ref 15–41)
Albumin: 2.2 g/dL — ABNORMAL LOW (ref 3.5–5.0)
Alkaline Phosphatase: 109 U/L (ref 38–126)
Anion gap: 7 (ref 5–15)
BUN: 15 mg/dL (ref 6–20)
CO2: 26 mmol/L (ref 22–32)
Calcium: 8.2 mg/dL — ABNORMAL LOW (ref 8.9–10.3)
Chloride: 106 mmol/L (ref 98–111)
Creatinine, Ser: 0.79 mg/dL (ref 0.61–1.24)
GFR calc Af Amer: >60 mL/min (ref >60)
GFR calc non Af Amer: >60 mL/min (ref >60)
Glucose, Bld: 97 mg/dL (ref 70–99)
Potassium: 3.5 mmol/L (ref 3.5–5.1)
Sodium: 139 mmol/L (ref 135–145)
Total Bilirubin: 0.8 mg/dL (ref 0.3–1.2)
Total Protein: 5.7 g/dL — ABNORMAL LOW (ref 6.5–8.1)

## 2019-07-12 LAB — PHOSPHORUS: Phosphorus: 3 mg/dL (ref 2.5–4.6)

## 2019-07-12 NOTE — Progress Notes (Signed)
PT Equipment Note   Patient suffers from sig deficits in safety and mobility following his VF cardiac arrest. These deficits impair his ability to perform daily activities like ambulating in the home or in to follow-up medical appointments. A walker alone will not resolve the issues with performing activities of daily living as the pt currently is unable to demonstrate sufficient balance to utilize a walker without significant assist. A wheelchair will allow patient to safely perform daily tasks of self-care as well as generally improved participation in life.  Deland Pretty, DPT   Acute Rehabilitation Department Pager #: [phone]

## 2019-07-12 NOTE — Progress Notes (Signed)
Physical Therapy Treatment Patient Details Name: Pedro Gomez MRN: [id number] DOB: 1980/10/27 Today's Date: 07/12/2019    History of Present Illness Pt is a 39 y.o. male admitted 06/30/19 post-VF cardiac arrest with downtime ~10 minutes; pt with HR but GCS of 3 upon arrival; h/o alcohol abuse vs polysubstance abuse. ETT 1/1-1/9. Pt with bilateral paraparesis. Head CT with no acute findings. MRI C and T-spine rule out spinal cord infarct. PMH includes polysubance abuse, afib.    PT Comments    Pt is OOB in recliner upon arrival of PT, agreeable to PT session focused on progression of mobility as well as provision of HEP due to anticipated d/c home today. The pt continues to present with sig limitations in safety with functional mobility, static and dynamic balance, and coordination which are all complicated by poor cognition and poor safety awareness. The pt was able to demonstrate slight improvements today with mobility, but requires constant supervision and cues to avoid obstacles and make adjustments to improve safe performance of transfers and ambulation. The pt is able to use a RW with supervision for mobility, but requires hands on assist for any/all obstacles as he remains a sig falls risk. The pt would benefit most from continued therapies with CIR, but as family is currently declining CIR, max HH therapies are recommended. The pt was provided with a home exercise plan for general strengthening and some balance activities, but this is not an adequate replacement of continued skilled PT care. PT will continue to follow acutely and recommend all therapies accessible to this pt.    Follow Up Recommendations  Home health PT;Supervision/Assistance - 24 hour(pt family declining CIR at this time, recommend max HH services)     Equipment Recommendations  Wheelchair (measurements PT);Wheelchair cushion (measurements PT);3in1 (PT);Rolling walker with 5" wheels(pt family declining CIR, will need  selected equipment to mobilize at home safely)    Recommendations for Other Services       Precautions / Restrictions Precautions Precautions: Fall Restrictions Weight Bearing Restrictions: No    Mobility  Bed Mobility Overal bed mobility: Needs Assistance Bed Mobility: Supine to Sit     Supine to sit: HOB elevated;Supervision     General bed mobility comments: pt able to come to sitting EOB without assist, additional cueing for moving hips to EOB, but pt able to come to sitting and use BUE for stability  Transfers Overall transfer level: Needs assistance Equipment used: Rolling walker (2 wheeled) Transfers: Sit to/from [facility] Sit to Stand: Supervision Stand pivot transfers: Supervision       General transfer comment: min guard for safety initially, progressed to supervision with reps. pt eventually able to place hands without cues  Ambulation/Gait Ambulation/Gait assistance: Min guard Gait Distance (Feet): 20 Feet(20 ft x 2) Assistive device: Rolling walker (2 wheeled) Gait Pattern/deviations: Step-through pattern;Narrow base of support   Gait velocity interpretation: <1.31 ft/sec, indicative of household ambulator General Gait Details: pt with poor management of RW, needs vcs to adjust when he runs into obstacles in room. Pt with improved stability, no LOB   Stairs             Wheelchair Mobility    Modified Rankin (Stroke Patients Only)       Balance Overall balance assessment: Needs assistance Sitting-balance support: Feet unsupported;Bilateral upper extremity supported Sitting balance-Leahy Scale: Fair Sitting balance - Comments: pt able to maintain sitting EOB with use of BUE for stability, no LOB   Standing balance support: Bilateral upper  extremity supported;During functional activity Standing balance-Leahy Scale: Poor Standing balance comment: requires B UE support in static standing and dynamic stance                             Cognition Arousal/Alertness: Awake/alert Behavior During Therapy: WFL for tasks assessed/performed;Flat affect Overall Cognitive Status: Impaired/Different from baseline Area of Impairment: Orientation;Attention;Memory;Following commands;Safety/judgement;Awareness;Problem solving                 Orientation Level: Disoriented to;Time;Situation Current Attention Level: Selective Memory: Decreased short-term memory Following Commands: Follows one step commands with increased time;Follows multi-step commands inconsistently Safety/Judgement: Decreased awareness of safety;Decreased awareness of deficits Awareness: Intellectual Problem Solving: Slow processing;Decreased initiation;Difficulty sequencing;Requires verbal cues;Requires tactile cues General Comments: pt unable to recall information at end of session provided at beginning of session, difficulty with recognizing objects without multimodal cues, needs continued cues for safe use of RW and performance of all transfers/mobiltiy. Pt with poor navigation of RW, needs VC to avoid objects      Exercises Other Exercises Other Exercises: Sit-Stand: 3 x 5 through session Other Exercises: seated toe taps 2 x 10 BLE, pt with poor LE coordination, slight improvement with reps    General Comments        Pertinent Vitals/Pain Pain Assessment: No/denies pain Pain Intervention(s): Monitored during session;Limited activity within patient's tolerance    Home Living                      Prior Function            PT Goals (current goals can now be found in the care plan section) Acute Rehab PT Goals Patient Stated Goal: go home PT Goal Formulation: With patient Time For Goal Achievement: 07/22/19 Potential to Achieve Goals: Good Progress towards PT goals: Progressing toward goals    Frequency    Min 5X/week      PT Plan Current plan remains appropriate    Co-evaluation               AM-PAC PT "6 Clicks" Mobility   Outcome Measure  Help needed turning from your back to your side while in a flat bed without using bedrails?: A Little Help needed moving from lying on your back to sitting on the side of a flat bed without using bedrails?: A Little Help needed moving to and from a bed to a chair (including a wheelchair)?: A Little Help needed standing up from a chair using your arms (e.g., wheelchair or bedside chair)?: A Little Help needed to walk in hospital room?: A Little Help needed climbing 3-5 steps with a railing? : A Lot 6 Click Score: 17    End of Session Equipment Utilized During Treatment: Gait belt Activity Tolerance: Patient tolerated treatment well Patient left: in bed;with call bell/phone within reach Nurse Communication: Mobility status(equipment needs) PT Visit Diagnosis: Muscle weakness (generalized) (M62.81);Difficulty in walking, not elsewhere classified (R26.2);Apraxia (R48.2);Other symptoms and signs involving the nervous system (R29.898)     Time: 8341-9622 PT Time Calculation (min) (ACUTE ONLY): 61 min  Charges:  $Gait Training: 23-37 mins $Neuromuscular Re-education: 8-22 mins $Self Care/Home Management: 8-22                     Rolm Baptise, PT, DPT   Acute Rehabilitation Department Pager #: [phone]   Gaetana Michaelis 07/12/2019, 2:42 PM

## 2019-07-12 NOTE — Progress Notes (Signed)
Occupational Therapy Treatment Patient Details Name: Pedro Gomez MRN: [id number] DOB: 1981/03/26 Today's Date: 07/12/2019    History of present illness Pt is a 39 y.o. male admitted 06/30/19 post-VF cardiac arrest with downtime ~10 minutes; pt with HR but GCS of 3 upon arrival; h/o alcohol abuse vs polysubstance abuse. ETT 1/1-1/9. Pt with bilateral paraparesis. Head CT with no acute findings. MRI C and T-spine rule out spinal cord infarct. PMH includes polysubance abuse, afib.   OT comments  Pt assisted to chair with min assist and use of RW. Mod assist required to don front opening gown for warmth and to set up breakfast tray. Pt self feeding primarily with L hand, although he reports being R handed and L hand demonstrates incoordination. Pt pleasant. Continues to demonstrated marked cognitive impairment and lack of awareness. Continue to recommend CIR, but per chart, mom wants home with home programs she can implement.   Follow Up Recommendations  CIR;Supervision/Assistance - 24 hour(but mother is refusing)    Equipment Recommendations  3 in 1 bedside commode    Recommendations for Other Services      Precautions / Restrictions Precautions Precautions: Fall       Mobility Bed Mobility Overal bed mobility: Needs Assistance Bed Mobility: Supine to Sit     Supine to sit: HOB elevated;Supervision        Transfers Overall transfer level: Needs assistance Equipment used: Rolling walker (2 wheeled) Transfers: Sit to/from [facility] Sit to Stand: Min assist Stand pivot transfers: Min assist       General transfer comment: min assist to rise and steady, multimodal cues for hand placement    Balance Overall balance assessment: Needs assistance Sitting-balance support: Feet unsupported;Bilateral upper extremity supported Sitting balance-Leahy Scale: Fair       Standing balance-Leahy Scale: Poor Standing balance comment: requires B UE support in static  standing                           ADL either performed or assessed with clinical judgement   ADL Overall ADL's : Needs assistance/impaired Eating/Feeding: Minimal assistance;Sitting Eating/Feeding Details (indicate cue type and reason): assisted to open packages and cut food Grooming: Wash/dry hands;Sitting;Set up Grooming Details (indicate cue type and reason): cues for thoroughness         Upper Body Dressing : Moderate assistance;Sitting Upper Body Dressing Details (indicate cue type and reason): verbal cues to sequence donning front opening gown                         Vision       Perception     Praxis      Cognition Arousal/Alertness: Awake/alert Behavior During Therapy: WFL for tasks assessed/performed Overall Cognitive Status: Impaired/Different from baseline Area of Impairment: Orientation;Attention;Memory;Following commands;Safety/judgement;Awareness;Problem solving                 Orientation Level: Disoriented to;Time;Situation Current Attention Level: Selective Memory: Decreased short-term memory Following Commands: Follows one step commands with increased time;Follows multi-step commands inconsistently Safety/Judgement: Decreased awareness of safety;Decreased awareness of deficits Awareness: Intellectual Problem Solving: Slow processing;Decreased initiation;Difficulty sequencing;Requires verbal cues;Requires tactile cues General Comments: pt unable to recall information at end of session provided at beginning of session        Exercises     Shoulder Instructions       General Comments      Pertinent Vitals/ Pain  Pain Assessment: No/denies pain  Home Living                                          Prior Functioning/Environment              Frequency  Min 2X/week        Progress Toward Goals  OT Goals(current goals can now be found in the care plan section)  Progress towards OT  goals: Progressing toward goals  Acute Rehab OT Goals Patient Stated Goal: get warmer OT Goal Formulation: With patient Time For Goal Achievement: 07/24/19 Potential to Achieve Goals: Good  Plan Discharge plan remains appropriate    Co-evaluation                 AM-PAC OT "6 Clicks" Daily Activity     Outcome Measure   Help from another person eating meals?: A Little Help from another person taking care of personal grooming?: A Little Help from another person toileting, which includes using toliet, bedpan, or urinal?: A Lot Help from another person bathing (including washing, rinsing, drying)?: A Lot Help from another person to put on and taking off regular upper body clothing?: A Lot Help from another person to put on and taking off regular lower body clothing?: A Lot 6 Click Score: 14    End of Session Equipment Utilized During Treatment: Gait belt;Rolling walker  OT Visit Diagnosis: Unsteadiness on feet (R26.81);Cognitive communication deficit (R41.841)   Activity Tolerance Patient tolerated treatment well   Patient Left in chair;with call bell/phone within reach;with chair alarm set   Nurse Communication Mobility status        Time: 1324-4010 OT Time Calculation (min): 24 min  Charges: OT General Charges $OT Visit: 1 Visit OT Treatments $Self Care/Home Management : 23-37 mins  Martie Round, OTR/L Acute Rehabilitation Services Pager: [phone] Office: [phone]   Evern Bio 07/12/2019, 10:29 AM

## 2019-07-12 NOTE — Plan of Care (Signed)
?  Problem: Clinical Measurements: ?Goal: Will remain free from infection ?Outcome: Progressing ?  ?

## 2019-07-12 NOTE — Discharge Summary (Signed)
Physician Discharge Summary  Cherylynn RidgesCalvin E Gomez OZH:[id number] DOB: 03-31-1981 DOA: 06/30/2019  PCP: Patient, No Pcp Per  Admit date: 06/30/2019 Discharge date: 07/12/2019  Admitted From: Home Disposition:  Home (family declined CIR)   Recommendations for Outpatient Follow-up:  1. Follow up with PCP in 1 week 2. Repeat LFT as an outpatient  Home Health: PT OT RN Aide SW    Discharge Condition: Stable CODE STATUS: Full  Diet recommendation: Heart healthy   Brief/Interim Summary: From H&P per Dr. Carlota RaspberryScatliffe: "Pedro Gomez is a 39 y.o. M with PMH of Atrial fibrillation and small PFO diagnosed in 2019 not on any medical therapy or anti-coagulation, alcohol, tobacco and marijuana use who was in his usual state of health until he collapsed in the car while his girlfriend was driving.  She reports that they had been doing errands today, he ate and watched football and had two alcoholic drinks, he was not complaining of anything.  They got in the car around 8pm and she noticed he was more quiet than normal, then he made a yawning noise and slumped over.  She pulled over and called 911 then pulled him out of the car and attempted to start CPR before EMS arrived, girlfriend estimates it was about 5 minutes from the time he collapsed to when EMS took over.  CPR was continued for 4 minutes with ROSC after defibrillation and Epix1.  On arrival to the ED pt was intubated and EKG showed sinus rhythm, CT head without acute findings, labs significant for negative troponin, cocaine and Tetrahydrocannabinol positive, K 2.9, CO2 17, creatinine 1.7 and AST/ALT 66.   He was started on cooling protocol and PCCM consulted for admission."  Interim: During his hospitalization, he was treated for aspiration pneumonia.  He was eventually extubated on 07/08/2019.  Patient had some acute metabolic encephalopathy and was seen by neurology.  He was also given tube feeds through cortrak.  Ileus resolved.  Cortrak removed once  patient's p.o. intake improved.  Patient was evaluated by physical therapy who recommended CIR which family declined.  Patient was discharged home with home health services.  Discharge Diagnoses:  Active Problems:   Cardiac arrest (HCC)   Encephalopathy acute   Cocaine abuse (HCC)   Polysubstance abuse (HCC)   Paroxysmal atrial fibrillation (HCC)   PFO (patent foramen ovale)   Current smoker   Acute respiratory failure with hypoxia secondary to cardiac arrest/aspiration pneumonia Patient was intubated during cardiac arrest and admitted to the critical care service.  Initially there was no infectious or parapneumonic abnormalities noted on radiography.  Patient noted to have an aspiration event the morning of 07/02/2019 with a new right mid to lower lobe opacification consistent with aspiration along with hypoxia.    Respiratory culture consistent with normal respiratory flora.  Patient status post full course of cefepime for aspiration pneumonia from 07/02/2019-07/07/2019.  Patient extubated on 07/08/2019.  Now on room air  Acute metabolic encephalopathy Likely secondary to decreased cerebral perfusion from cardiac arrest.  Patient noted to be intubated and sedated after cardiac arrest and was placed on targeted temperature management.  Initial EEG done was negative for any seizure activity.  Patient noted to have some twitching temporal muscle and abnormal eye movement on 07/04/2019 and repeat EEG done showed no seizure activity.  MRI brain done on 07/04/2019 with no acute abnormalities noted.  Patient was placed on the Precedex drip which has subsequently been discontinued.  Patient seen by neurology.  Stable  Cardiac arrest/cardiogenic shock  Felt secondary to stenting from cocaine versus prolonged arrhythmia.  2D echo done with a EF of 50 to 55%, mildly increased LVH, no wall motion abnormalities.   Ileus Status post tube feeds.  Patient on bowel regimen of prokinetics.  Cortrack placed 07/07/2019.   Tube feeds resumed.  Patient with multiple bowel movements with flatus.  Ileus resolved.    Remove cortrak now that patient's p.o. intake improved.  Normocytic anemia Hemoglobin noted to be trending down during the hospitalization and aspirin discontinued on 07/06/2019.  Patient with no overt bleeding.  Hemoglobin stable  Elevated liver enzyme Continue to monitor.  Stop Tylenol   Discharge Instructions  Discharge Instructions    Call MD for:  difficulty breathing, headache or visual disturbances   Complete by: As directed    Call MD for:  extreme fatigue   Complete by: As directed    Call MD for:  persistant dizziness or light-headedness   Complete by: As directed    Call MD for:  persistant nausea and vomiting   Complete by: As directed    Call MD for:  severe uncontrolled pain   Complete by: As directed    Call MD for:  temperature >100.4   Complete by: As directed    Diet - low sodium heart healthy   Complete by: As directed    Discharge instructions   Complete by: As directed    You were cared for by a hospitalist during your hospital stay. If you have any questions about your discharge medications or the care you received while you were in the hospital after you are discharged, you can call the unit and ask to speak with the hospitalist on call if the hospitalist that took care of you is not available. Once you are discharged, your primary care physician will handle any further medical issues. Please note that NO REFILLS for any discharge medications will be authorized once you are discharged, as it is imperative that you return to your primary care physician (or establish a relationship with a primary care physician if you do not have one) for your aftercare needs so that they can reassess your need for medications and monitor your lab values.   Increase activity slowly   Complete by: As directed      Allergies as of 07/12/2019      Reactions   Lactose Intolerance (gi)  Diarrhea      Medication List    TAKE these medications   famotidine 10 MG tablet Commonly known as: PEPCID Take 10 mg by mouth 2 (two) times daily as needed for heartburn or indigestion.      Follow-up Information    [facility]. Schedule an appointment as soon as possible for a visit in 1 week(s).   Contact information: [address] [place] [address] [phone]         Allergies  Allergen Reactions  . Lactose Intolerance (Gi) Diarrhea    Consultations:  PCCM admission  Neurology   Procedures/Studies: CT Head Wo Contrast  Result Date: 06/30/2019 CLINICAL DATA:  Altered mental status EXAM: CT HEAD WITHOUT CONTRAST TECHNIQUE: Contiguous axial images were obtained from the base of the skull through the vertex without intravenous contrast. COMPARISON:  CT brain 06/27/2007 FINDINGS: Brain: No acute territorial infarction, hemorrhage, or intracranial mass is visualized. The ventricles are nonenlarged. Vascular: No hyperdense vessel or unexpected calcification. Skull: Normal. Negative for fracture or focal lesion. Sinuses/Orbits: Moderate mucosal thickening in the  maxillary and ethmoid sinuses with mild mucosal thickening in the sphenoid and frontal sinuses. Moderate fluid and debris in the nasopharynx. Other: None IMPRESSION: 1. No CT evidence for acute intracranial abnormality. 2. Sinus disease.  Fluid and debris in the nasopharynx. Electronically Signed   By: Jasmine PangKim  Fujinaga M.D.   On: 06/30/2019 23:25   MR BRAIN WO CONTRAST  Result Date: 07/05/2019 CLINICAL DATA:  Encephalopathy and loss of consciousness. EXAM: MRI HEAD WITHOUT CONTRAST TECHNIQUE: Multiplanar, multiecho pulse sequences of the brain and surrounding structures were obtained without intravenous contrast. COMPARISON:  None. FINDINGS: BRAIN: There is no acute infarct, acute hemorrhage or extra-axial collection. The white matter signal is normal for the patient's  age. The cerebral and cerebellar volume are age-appropriate. There is no hydrocephalus. The midline structures are normal. VASCULAR: The major intracranial arterial and venous sinus flow voids are normal. Susceptibility-sensitive sequences show no chronic microhemorrhage or superficial siderosis. SKULL AND UPPER CERVICAL SPINE: Calvarial bone marrow signal is normal. There is no skull base mass. The visualized upper cervical spine and soft tissues are normal. SINUSES/ORBITS: Small amount of bilateral mastoid fluid. Mild diffuse paranasal sinus mucosal thickening. The orbits are normal. IMPRESSION: Normal MRI of the brain. Electronically Signed   By: Deatra RobinsonKevin  Herman M.D.   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 Cannot move extremities EXAM: MRI CERVICAL AND THORACIC SPINE WITHOUT AND WITH CONTRAST TECHNIQUE: Multisequence MR imaging of the cervical and thoracic was performed prior to and following IV contrast. CONTRAST:  8mL GADAVIST GADOBUTROL 1 MMOL/ML IV SOLN COMPARISON:  None. FINDINGS: MRI CERVICAL SPINE FINDINGS Alignment: Reversal of cervical lordosis.  No listhesis. Vertebrae: No fracture, evidence of discitis, or bone lesion. Cord: Axial images are significantly limited by motion such that cord evaluation is primarily based on sagittal images. No evidence of swelling, abnormal enhancement, or signal abnormality. Posterior Fossa, vertebral arteries, paraspinal tissues: Negative Disc levels: C2-3: Unremarkable. C3-4: Uncovertebral ridging with patent canal and foramina C4-5: Disc narrowing with bilateral vertebral ridging and foraminal stenosis that is mild on the right and borderline moderate on the left. C5-6: Disc narrowing and bulging with shallow protrusion. The canal and foramina remain patent C6-7: Mild right-sided annulus bulging.  No impingement C7-T1:Unremarkable. MRI THORACIC SPINE FINDINGS  Alignment:  Normal Vertebrae: No fracture, evidence of discitis, or bone lesion. Cord:  Normal signal and morphology. Paraspinal and other soft tissues: Bilateral airspace disease as seen on chest x-ray from 3 days ago. Disc levels: No significant degenerative changes or impingement. IMPRESSION: 1. No evidence of myelopathy or other cause of weakness. 2. Extensive bilateral airspace disease. 3. Motion degraded. Electronically Signed   By: Marnee SpringJonathon  Watts M.D.   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 Cannot move extremities EXAM: MRI CERVICAL AND THORACIC SPINE WITHOUT AND WITH CONTRAST TECHNIQUE: Multisequence MR imaging of the cervical and thoracic was performed prior to and following IV contrast. CONTRAST:  8mL GADAVIST GADOBUTROL 1 MMOL/ML IV SOLN COMPARISON:  None. FINDINGS: MRI CERVICAL SPINE FINDINGS Alignment: Reversal of cervical lordosis.  No listhesis. Vertebrae: No fracture, evidence of discitis, or bone lesion. Cord: Axial images are significantly limited by motion such that cord evaluation is primarily based on sagittal images. No evidence of swelling, abnormal enhancement, or signal abnormality. Posterior Fossa, vertebral arteries, paraspinal tissues: Negative Disc levels:  C2-3: Unremarkable. C3-4: Uncovertebral ridging with patent canal and foramina C4-5: Disc narrowing with bilateral vertebral ridging and foraminal stenosis that is mild on the right and borderline moderate on the left. C5-6: Disc narrowing and bulging with shallow protrusion. The canal and foramina remain patent C6-7: Mild right-sided annulus bulging.  No impingement C7-T1:Unremarkable. MRI THORACIC SPINE FINDINGS Alignment:  Normal Vertebrae: No fracture, evidence of discitis, or bone lesion. Cord:  Normal signal and morphology. Paraspinal and other soft tissues: Bilateral airspace disease as seen on chest x-ray from 3 days ago.  Disc levels: No significant degenerative changes or impingement. IMPRESSION: 1. No evidence of myelopathy or other cause of weakness. 2. Extensive bilateral airspace disease. 3. Motion degraded. Electronically Signed   By: Marnee Spring M.D.   On: 07/06/2019 06:16   DG CHEST PORT 1 VIEW  Result Date: 07/10/2019 CLINICAL DATA:  Status post recent cardiac arrest EXAM: PORTABLE CHEST 1 VIEW COMPARISON:  July 09, 2019 FINDINGS: Feeding tube tip is below the diaphragm. Central catheter tip is cavoatrial junction. No pneumothorax. There is widespread airspace opacity throughout the lungs bilaterally with slightly less consolidation in the bases. No new airspace opacity evident. Heart upper normal in size with pulmonary vascularity normal. No adenopathy. No bone lesions. IMPRESSION: Tube and catheter positions as described without pneumothorax. Widespread airspace opacity bilaterally. Question multifocal pneumonia versus noncardiogenic edema. Both entities may be present concurrently. Slightly less consolidation in the bases. No new opacity evident. Stable cardiac silhouette. Electronically Signed   By: Bretta Bang III M.D.   On: 07/10/2019 08:37   DG CHEST PORT 1 VIEW  Result Date: 07/09/2019 CLINICAL DATA:  39 year old male with respiratory failure and hypoxia. EXAM: PORTABLE CHEST 1 VIEW COMPARISON:  Chest radiograph dated 07/07/2019. FINDINGS: Interval removal of the enteric tube and placement of a feeding tube with tip beyond the inferior margin of the image. Right-sided PICC in similar position. Interval worsening of the diffuse bilateral confluent airspace opacities since the prior radiograph. No large pleural effusion or pneumothorax. Stable cardiac silhouette. No acute osseous pathology. IMPRESSION: Interval worsening of the diffuse bilateral confluent airspace opacities. Continued follow-up recommended. Electronically Signed   By: Elgie Collard M.D.   On: 07/09/2019 03:52   DG CHEST PORT 1  VIEW  Result Date: 07/07/2019 CLINICAL DATA:  Respiratory failure. EXAM: PORTABLE CHEST 1 VIEW COMPARISON:  Chest x-ray 07/03/2019, 06/30/2019. FINDINGS: Endotracheal tube tip 5 cm above the carina. NG tube tip noted over the stomach. NG tube side hole just above the gastroesophageal junction. Advancement of the NG tube approximately 10 cm should be considered. Right PICC line in stable position. Stable cardiomegaly. Diffuse multifocal bilateral pulmonary infiltrates/edema again noted. Interim significant improvement in aeration of the right lung base from prior exam. Increased infiltrates in the upper lungs bilaterally noted. No pleural effusion or pneumothorax. IMPRESSION: 1. Endotracheal tip 5 cm above the carina. Right PICC line stable position. 2. NG tube tip noted over the stomach. NG tube side hole just above the gastroesophageal junction. Advancement of the NG tube approximately 10 cm should be considered. 3.  Stable cardiomegaly. 4. Diffuse multifocal bilateral pulmonary infiltrates/edema again noted. Interim significant improvement in aeration of the right lung base from prior exam. Increased infiltrates in the upper lungs bilaterally noted. Electronically Signed   By: Maisie Fus  Register   On: 07/07/2019 06:19   DG CHEST PORT 1 VIEW  Result Date: 07/03/2019 CLINICAL DATA:  Reason for EXAM: PORTABLE CHEST 1 VIEW Aspiration into airway. COMPARISON:  Chest radiograph 06/30/2019 FINDINGS: ET tube terminates 0.8 cm above the level of the carina. An enteric tube passes below level of left hemidiaphragm with tip excluded from the field of view. Right-sided PICC with tip at the level of the superior cavoatrial junction. Overlying cardiac monitoring leads and defibrillator pads. The cardiomediastinal silhouette is unchanged. New from prior examination, there is prominent airspace disease within the right mid to lower lung field. A small right pleural effusion is difficult to exclude. No airspace disease  identified within the left lung. No evidence of pneumothorax. No acute bony abnormality. IMPRESSION: Support apparatus as described. New extensive airspace disease within the right mid to lower lung field consistent with the provided history of aspiration. Electronically Signed   By: Jackey Loge DO   On: 07/03/2019 07:11   DG Chest Portable 1 View  Result Date: 06/30/2019 CLINICAL DATA:  Cardiac arrest EXAM: PORTABLE CHEST 1 VIEW COMPARISON:  09/28/2017 FINDINGS: The endotracheal tube terminates approximately 5.2 cm above the carina. The OG tube terminates below the left hemidiaphragm. There is no pneumothorax or large pleural effusion. There is no focal infiltrate. There is no acute displaced rib fracture. IMPRESSION: 1. Satisfactory position of the endotracheal tube. 2. OG tube terminates below the left hemidiaphragm. 3. No acute cardiopulmonary process. Electronically Signed   By: Katherine Mantle M.D.   On: 06/30/2019 22:22   DG Abd Portable 1V  Result Date: 07/07/2019 CLINICAL DATA:  Orogastric tube placement EXAM: PORTABLE ABDOMEN - 1 VIEW COMPARISON:  July 06, 2019 FINDINGS: Orogastric tube tip and side port are now in the distal body of the stomach. There remains colonic dilatation. No free air. There is atelectatic change in the lung bases. IMPRESSION: Orogastric tube tip and side port in distal body of stomach. Persistent colonic dilatation. Suspect a degree of colonic ileus. No free air. Stable appearing lung bases. Electronically Signed   By: Bretta Bang III M.D.   On: 07/07/2019 09:02   DG Abd Portable 1V  Result Date: 07/06/2019 CLINICAL DATA:  Cardiac arrest EXAM: PORTABLE ABDOMEN - 1 VIEW COMPARISON:  07/03/2019 FINDINGS: NG tube tip is in the proximal stomach with the side port in the distal esophagus. There is diffuse gaseous distention of bowel, most notable throughout the colon, likely ileus. This has progressed since prior study. No visible free air. IMPRESSION: Increasing  gaseous distention of bowel, predominantly colon, likely ileus. Electronically Signed   By: Charlett Nose M.D.   On: 07/06/2019 10:47   DG Abd Portable 1V  Result Date: 07/03/2019 CLINICAL DATA:  Nausea and vomiting. EXAM: PORTABLE ABDOMEN - 1 VIEW COMPARISON:  None. FINDINGS: NG tube is in place in good position with both the tip and side-port in the stomach. Bowel gas pattern is benign. No abnormal abdominal calcification focal bony abnormality. IMPRESSION: No acute finding. NG tube in good position. Electronically Signed   By: Drusilla Kanner M.D.   On: 07/03/2019 15:13   DG Swallowing Func-Speech Pathology  Result Date: 07/09/2019 Objective Swallowing Evaluation: Type of Study: MBS-Modified Barium Swallow Study  Patient Details Name: KALEAB FRASIER MRN: [id number] Date of Birth: 1980-08-27 Today's Date: 07/09/2019 Time: SLP Start Time (ACUTE ONLY): 1230 -SLP Stop Time (ACUTE ONLY): 1240 SLP Time Calculation (min) (ACUTE ONLY): 10 min Past Medical History: Past Medical History: Diagnosis Date . Atrial fibrillation (HCC)  . Polysubstance abuse (HCC)  . Tobacco abuse  Past Surgical History: No past surgical history on file. HPI: 39 yo gentleman with history of afib,  polysubstance use with witnessed cardiac arrest w/bystander followed by EMS CPR and vfib/vtach s/p defibrillation in the field.  CXR 1/10: "Interval worsening of the diffuse bilateral confluent airspace opacities"  Head CT 1/1: no acute findings  Subjective: pt awake, alert, pleasant, participative Assessment / Plan / Recommendation CHL IP CLINICAL IMPRESSIONS 07/09/2019 Clinical Impression Pt presents with functional oropharyngeal swallowing.  There was no penetration or aspiration with any consistency trials, including thin liquid by straw.  With pill simulation there was oral statsis of tablet when given with thin liquid wash, and mastication of tablet when given with puree.  This is suspected to be 2/2 cognitive deficits rather than a true oral  dysphagia.  Recommend regular texture diet with thin liquids.  Administer medications crushed with puree.  SLP to follow for diet tolerance. SLP Visit Diagnosis Dysphagia, unspecified (R13.10) Attention and concentration deficit following -- Frontal lobe and executive function deficit following -- Impact on safety and function No limitations   CHL IP TREATMENT RECOMMENDATION 07/09/2019 Treatment Recommendations Therapy as outlined in treatment plan below   Prognosis 07/09/2019 Prognosis for Safe Diet Advancement Good Barriers to Reach Goals -- Barriers/Prognosis Comment -- CHL IP DIET RECOMMENDATION 07/09/2019 SLP Diet Recommendations Regular solids;Thin liquid Liquid Administration via Cup;No straw Medication Administration Crushed with puree Compensations Slow rate;Small sips/bites Postural Changes Seated upright at 90 degrees   CHL IP OTHER RECOMMENDATIONS 07/09/2019 Recommended Consults (No Data) Oral Care Recommendations Oral care BID Other Recommendations --   CHL IP FOLLOW UP RECOMMENDATIONS 07/09/2019 Follow up Recommendations (No Data)   CHL IP FREQUENCY AND DURATION 07/09/2019 Speech Therapy Frequency (ACUTE ONLY) min 2x/week Treatment Duration 2 weeks      CHL IP ORAL PHASE 07/09/2019 Oral Phase Impaired Oral - Pudding Teaspoon -- Oral - Pudding Cup -- Oral - Honey Teaspoon -- Oral - Honey Cup -- Oral - Nectar Teaspoon -- Oral - Nectar Cup -- Oral - Nectar Straw -- Oral - Thin Teaspoon -- Oral - Thin Cup WFL Oral - Thin Straw WFL Oral - Puree WFL Oral - Mech Soft -- Oral - Regular WFL Oral - Multi-Consistency -- Oral - Pill Holding of bolus;Reduced posterior propulsion Oral Phase - Comment --  CHL IP PHARYNGEAL PHASE 07/09/2019 Pharyngeal Phase Impaired Pharyngeal- Pudding Teaspoon -- Pharyngeal -- Pharyngeal- Pudding Cup -- Pharyngeal -- Pharyngeal- Honey Teaspoon -- Pharyngeal -- Pharyngeal- Honey Cup -- Pharyngeal -- Pharyngeal- Nectar Teaspoon -- Pharyngeal -- Pharyngeal- Nectar Cup -- Pharyngeal --  Pharyngeal- Nectar Straw -- Pharyngeal -- Pharyngeal- Thin Teaspoon -- Pharyngeal -- Pharyngeal- Thin Cup Delayed swallow initiation-vallecula Pharyngeal Material does not enter airway Pharyngeal- Thin Straw Delayed swallow initiation-pyriform sinuses Pharyngeal Material does not enter airway Pharyngeal- Puree Delayed swallow initiation-vallecula Pharyngeal Material does not enter airway Pharyngeal- Mechanical Soft -- Pharyngeal -- Pharyngeal- Regular Delayed swallow initiation-vallecula Pharyngeal Material does not enter airway Pharyngeal- Multi-consistency -- Pharyngeal -- Pharyngeal- Pill (No Data) Pharyngeal (No Data) Pharyngeal Comment --  CHL IP CERVICAL ESOPHAGEAL PHASE 07/09/2019 Cervical Esophageal Phase WFL Pudding Teaspoon -- Pudding Cup -- Honey Teaspoon -- Honey Cup -- Nectar Teaspoon -- Nectar Cup -- Nectar Straw -- Thin Teaspoon -- Thin Cup -- Thin Straw -- Puree -- Mechanical Soft -- Regular -- Multi-consistency -- Pill -- Cervical Esophageal Comment -- Kerrie Pleasure, MA, CCC-SLP Acute Rehabilitation Services Office: [phone] 07/09/2019, 1:50 PM              EEG adult  Result Date: 07/04/2019 Charlsie Quest, MD     07/04/2019  9:34 AM Patient Name:Ghazi DESMUND ELMAN NFA:[id number] Epilepsy Attending:Priyanka Annabelle Harman Referring Physician/Provider:Laura Gleason, PA Date: 07/03/2019 Duration: 21.40 minutes  Patient history:38yo M s/p cardiac arrest on TTM. EEG to evaluate for seizure  Level of alertness:comatose  AEDs during EEG study:None  Technical aspects: This EEG study was done with scalp electrodes positioned according to the 10-20 International system of electrode placement. Electrical activity was acquired at a sampling rate of 500Hz  and reviewed with a high frequency filter of 70Hz  and a low frequency filter of 1Hz . EEG data were recorded continuously and digitally stored.  DESCRIPTION:EEG showed continuous generalized background attenuation. EEG was reactive to tactile  stimulation.Hyperventilation and photic stimulation were not performed. Of note, eeg was technically difficult due to significant myogenic artifact.  ABNORMALITY - Background attenuation, generalized  IMPRESSION: This technically difficult study issuggestive of profound diffuse encephalopathy, non specific to etiology but could be seen due to sedation, hypoxic-anoxic injury.No seizures or epileptiform discharges were seen throughout the recording.  Charlsie Quest   EEG adult  Result Date: 07/01/2019 Thana Farr, MD     07/01/2019  4:02 PM ELECTROENCEPHALOGRAM REPORT Patient: ZIQUAN FIDEL       Room #: 2H20C EEG No. ID: [id number] Age: 39 y.o.        Sex: male Referring Physician: Everardo All Report Date:  07/01/2019       Interpreting Physician: Thana Farr History: HARVARD ZEISS is an 39 y.o. male s/p arrest Medications: ASA, Thiamine, Fentanyl, Propofol, Neosynephrine Conditions of Recording:  This is a 21 channel routine scalp EEG performed with bipolar and monopolar montages arranged in accordance to the international 10/20 system of electrode placement. One channel was dedicated to EKG recording. The patient is in the intubated and sedated state. Description:  Artifact is prominent during the recording often obscuring the background rhythm. When able to be visualized the background is discontinuous.  It consists of a fairly generalized distribution of a well organized, low voltage mixture of theta and alpha activity.  This activity is interrupted by intermittent periods of attenuation that are generalized as well and last from 2-5 seconds.  This discontinuous activity is persistent throughout the recording.  The patient is stimulated during the recording with no change in the background noted.  No epileptiform activity is noted. Hyperventilation and intermittent photic stimulation were not performed. IMPRESSION: This electroencephalogram is characterized by a discontinuous rhythm that show no  evidence of reactivity.  These findings may be seen in a variety of circumstances, including anesthesia, drug intoxication, hypothermia, as well as diffuse cerebral injury.  Clinical/neurological, and radiographic correlation advised.  No epileptiform activity is noted.  Thana Farr, MD Neurology [phone] 07/01/2019, 3:48 PM   Overnight EEG with video  Result Date: 07/02/2019 Charlsie Quest, MD     07/03/2019  9:04 AM Patient Name: MARE LUDTKE MRN: [id number] Epilepsy Attending: Charlsie Quest Referring Physician/Provider: Emilie Rutter, PA Duration: 07/01/2019 to 1512 to 07/02/2019 1512 Patient history: 38yo M s/p cardiac arrest on TTM. EEG to evaluate for seizure Level of alertness: comatose AEDs during EEG study: Propofol Technical aspects: This EEG study was done with scalp electrodes positioned according to the 10-20 International system of electrode placement. Electrical activity was acquired at a sampling rate of 500Hz  and reviewed with a high frequency filter of 70Hz  and a low frequency filter of 1Hz . EEG data were recorded continuously and digitally stored. DESCRIPTION: EEG showed generalized 7.5-8.5hz  alpha activity as well as intermittent 1-3 seconds of generalized attenuation. EEG  was not reactive to tactile stimulation. Hyperventilation and photic stimulation were not performed. Event button was pressed on 07/01/2019 at 2229 for unclear reasons. Concomitant eeg showed generalized myogenic artifact.  ABNORMALITY - Alpha activity, generalized - Background attenuation, generalized IMPRESSION: This study is suggestive of profound diffuse encephalopathy, non specific to etiology but could be seen due to sedation, hypoxic-anoxic injury. No seizures or epileptiform discharges were seen throughout the recording. Event button was pressed on 06/30/2018 at 2229 for unclear reasons. Concomitant eeg didn't show any change to suggest seizure and was likely due to shivering.   ECHOCARDIOGRAM  COMPLETE  Result Date: 07/02/2019   ECHOCARDIOGRAM REPORT   Patient Name:   KARMELLO ABERCROMBIE Date of Exam: 07/02/2019 Medical Rec #:  [id number]        Height: Accession #:    [id number]       Weight:       196.9 lb Date of Birth:  1980-08-07        BSA:          2.00 m Patient Age:    38 years         BP:           98/65 mmHg Patient Gender: M                HR:           79 bpm. Exam Location:  Inpatient Procedure: 2D Echo Indications:    cardiac arrest  History:        Patient has no prior history of Echocardiogram examinations.                 Patent foramen ovale; Risk Factors:Current Smoker and                 polysubstance abuse.  Sonographer:    Delcie Roch Referring Phys: [id number] LAURA R GLEASON IMPRESSIONS  1. Left ventricular ejection fraction, by visual estimation, is 50 to 55%. The left ventricle has low normal function. There is mildly increased left ventricular hypertrophy.  2. The left ventricle has no regional wall motion abnormalities.  3. Left atrial size was normal.  4. Right atrial size was normal.  5. Trivial pericardial effusion is present.  6. The pericardial effusion is posterior to the left ventricle.  7. The mitral valve is grossly normal. No evidence of mitral valve regurgitation.  8. The tricuspid valve is grossly normal.  9. The aortic valve is tricuspid. Aortic valve regurgitation is not visualized. 10. The pulmonic valve was grossly normal. Pulmonic valve regurgitation is not visualized. 11. Global right ventricle has normal systolic function.The right ventricular size is normal. No increase in right ventricular wall thickness. 12. TR signal is inadequate for assessing pulmonary artery systolic pressure. 13. Intermittently seen bubbles in right heart, presumably from intavenous infusion. FINDINGS  Left Ventricle: Left ventricular ejection fraction, by visual estimation, is 50 to 55%. The left ventricle has low normal function. The left ventricle has no regional wall motion  abnormalities. The left ventricular internal cavity size was the left ventricle is normal in size. There is mildly increased left ventricular hypertrophy. Left ventricular diastolic parameters were normal. Right Ventricle: The right ventricular size is normal. No increase in right ventricular wall thickness. Global RV systolic function is has normal systolic function. Left Atrium: Left atrial size was normal in size. Right Atrium: Right atrial size was normal in size Pericardium: Trivial pericardial effusion is present. The pericardial effusion is posterior to the  left ventricle. Mitral Valve: The mitral valve is grossly normal. No evidence of mitral valve regurgitation. Tricuspid Valve: The tricuspid valve is grossly normal. Tricuspid valve regurgitation is trivial. Aortic Valve: The aortic valve is tricuspid. Aortic valve regurgitation is not visualized. Pulmonic Valve: The pulmonic valve was grossly normal. Pulmonic valve regurgitation is not visualized. Pulmonic regurgitation is not visualized. Aorta: The aortic root is normal in size and structure. IAS/Shunts: No atrial level shunt detected by color flow Doppler.  LEFT VENTRICLE PLAX 2D LVIDd:         4.60 cm  Diastology LVIDs:         3.40 cm  LV e' lateral:   10.80 cm/s LV PW:         1.20 cm  LV E/e' lateral: 7.9 LV IVS:        1.10 cm  LV e' medial:    11.50 cm/s LVOT diam:     2.20 cm  LV E/e' medial:  7.4 LV SV:         50 ml LVOT Area:     3.80 cm  RIGHT VENTRICLE RV S prime:     14.50 cm/s TAPSE (M-mode): 1.7 cm LEFT ATRIUM           Index       RIGHT ATRIUM           Index LA diam:      2.80 cm 1.40 cm/m  RA Area:     15.10 cm LA Vol (A4C): 36.9 ml 18.47 ml/m RA Volume:   42.60 ml  21.32 ml/m  AORTIC VALVE LVOT Vmax:   67.20 cm/s LVOT Vmean:  48.000 cm/s LVOT VTI:    0.130 m  AORTA Ao Root diam: 3.00 cm MITRAL VALVE MV Area (PHT): 4.39 cm             SHUNTS MV PHT:        50.17 msec           Systemic VTI:  0.13 m MV Decel Time: 173 msec              Systemic Diam: 2.20 cm MV E velocity: 85.30 cm/s 103 cm/s MV A velocity: 59.10 cm/s 70.3 cm/s MV E/A ratio:  1.44       1.5  Nona Dell MD Electronically signed by Nona Dell MD Signature Date/Time: 07/02/2019/12:47:09 PM    Final    [place] EKG SITE RITE  Result Date: 07/01/2019 If Site Rite image not attached, placement could not be confirmed due to current cardiac rhythm.      Discharge Exam: Vitals:   07/12/19 0800 07/12/19 1112  BP: 137/79 125/84  Pulse: 83 99  Resp: (!) 25 (!) 29  Temp: 98.7 F (37.1 C) 97.6 F (36.4 C)  SpO2:  93%     General: Pt is alert, awake, not in acute distress Cardiovascular: RRR, S1/S2 +, no edema Respiratory: CTA bilaterally, no wheezing, no rhonchi, no respiratory distress, no conversational dyspnea  Abdominal: Soft, NT, ND, bowel sounds + Extremities: no edema, no cyanosis Psych: Normal mood and affect, stable judgement and insight     The results of significant diagnostics from this hospitalization (including imaging, microbiology, ancillary and laboratory) are listed below for reference.     Microbiology: Recent Results (from the past 240 hour(s))  Culture, respiratory (non-expectorated)     Status: None   Collection Time: 07/03/19 11:52 AM   Specimen: Tracheal Aspirate; Respiratory  Result Value Ref Range Status  Specimen Description TRACHEAL ASPIRATE  Final   Special Requests NONE  Final   Gram Stain   Final    ABUNDANT WBC PRESENT, PREDOMINANTLY PMN ABUNDANT GRAM NEGATIVE RODS ABUNDANT GRAM POSITIVE COCCI FEW GRAM POSITIVE RODS    Culture   Final    Consistent with normal respiratory flora. Performed at [facility], [address] [place], [place] [address]    Report Status 07/06/2019 FINAL  Final     Labs: BNP (last 3 results) Recent Labs    07/09/19 0915  BNP 108.2*   Basic Metabolic Panel: Recent Labs  Lab 07/08/19 0411 07/09/19 0314 07/10/19 0356 07/11/19 0411 07/12/19 0453  NA 143 143 144  143 139  K 3.2* 3.8 3.4* 3.8 3.5  CL 111 110 109 111 106  CO2 22 23 26 26 26   GLUCOSE 105* 123* 120* 104* 97  BUN 28* 32* 23* 19 15  CREATININE 0.89 0.80 0.78 0.81 0.79  CALCIUM 7.8* 8.3* 8.2* 8.3* 8.2*  MG 1.6* 2.1 1.7 1.6* 1.7  PHOS 2.6 3.7 2.8 2.4* 3.0   Liver Function Tests: Recent Labs  Lab 07/08/19 0411 07/09/19 0314 07/10/19 0356 07/11/19 0411 07/12/19 0453  AST 49* 45* 54* 66* 103*  ALT 42 46* 63* 72* 114*  ALKPHOS 87 86 101 104 109  BILITOT 1.0 0.8 0.7 1.0 0.8  PROT 5.4* 5.7* 5.6* 5.6* 5.7*  ALBUMIN 1.9* 2.2* 2.1* 2.1* 2.2*   No results for input(s): LIPASE, AMYLASE in the last 168 hours. Recent Labs  Lab 07/05/19 1544  AMMONIA 11   CBC: Recent Labs  Lab 07/08/19 0411 07/09/19 0314 07/10/19 0356 07/11/19 0411 07/12/19 0453  WBC 7.3 14.2* 13.2* 11.6* 8.9  NEUTROABS  --   --   --   --  6.1  HGB 10.0* 11.3* 10.9* 10.2* 10.6*  HCT 29.4* 34.1* 33.5* 31.0* 31.1*  MCV 92.5 92.2 93.3 92.3 90.1  PLT 198 296 396 458* 538*   Cardiac Enzymes: Recent Labs  Lab 07/06/19 0609  CKTOTAL 633*   BNP: Invalid input(s): POCBNP CBG: Recent Labs  Lab 07/11/19 2004 07/11/19 2357 07/12/19 0334 07/12/19 0802 07/12/19 1118  GLUCAP 116* 102* 102* 92 128*   D-Dimer No results for input(s): DDIMER in the last 72 hours. Hgb A1c No results for input(s): HGBA1C in the last 72 hours. Lipid Profile No results for input(s): CHOL, HDL, LDLCALC, TRIG, CHOLHDL, LDLDIRECT in the last 72 hours. Thyroid function studies No results for input(s): TSH, T4TOTAL, T3FREE, THYROIDAB in the last 72 hours.  Invalid input(s): FREET3 Anemia work up No results for input(s): VITAMINB12, FOLATE, FERRITIN, TIBC, IRON, RETICCTPCT in the last 72 hours. Urinalysis    Component Value Date/Time   COLORURINE YELLOW 06/30/2019 2230   APPEARANCEUR CLOUDY (A) 06/30/2019 2230   LABSPEC 1.011 06/30/2019 2230   PHURINE 7.0 06/30/2019 2230   GLUCOSEU >=500 (A) 06/30/2019 2230   HGBUR NEGATIVE  06/30/2019 2230   BILIRUBINUR NEGATIVE 06/30/2019 2230   KETONESUR NEGATIVE 06/30/2019 2230   PROTEINUR 100 (A) 06/30/2019 2230   NITRITE NEGATIVE 06/30/2019 2230   LEUKOCYTESUR NEGATIVE 06/30/2019 2230   Sepsis Labs Invalid input(s): PROCALCITONIN,  WBC,  LACTICIDVEN Microbiology Recent Results (from the past 240 hour(s))  Culture, respiratory (non-expectorated)     Status: None   Collection Time: 07/03/19 11:52 AM   Specimen: Tracheal Aspirate; Respiratory  Result Value Ref Range Status   Specimen Description TRACHEAL ASPIRATE  Final   Special Requests NONE  Final   Gram  Stain   Final    ABUNDANT WBC PRESENT, PREDOMINANTLY PMN ABUNDANT GRAM NEGATIVE RODS ABUNDANT GRAM POSITIVE COCCI FEW GRAM POSITIVE RODS    Culture   Final    Consistent with normal respiratory flora. Performed at [facility], [place] [address], [place], [place] [address]    Report Status 07/06/2019 FINAL  Final     Patient was seen and examined on the day of discharge and was found to be in stable condition. Time coordinating discharge: 40 minutes including assessment and coordination of care, as well as examination of the patient.   SIGNED:  Dessa Phi, DO [facility] 07/12/2019, 1:01 PM

## 2019-07-12 NOTE — Plan of Care (Signed)
  Problem: Skin Integrity: Goal: Risk for impaired skin integrity will decrease Outcome: Progressing   

## 2019-07-12 NOTE — TOC Progression Note (Signed)
Transition of Care [facility]) - Progression Note    Patient Details  Name: Pedro Gomez MRN: [id number] Date of Birth: 11/10/1980  [facility]) CM/SW Contact  Leone Haven, RN Phone Number: 07/12/2019, 4:42 PM  Clinical Narrative:    NCM is working on trying to get LOG for [facility] services for this patient.  [facility] is the charity agency today, but they do not have the staff to staff the services for him.  NCM contacted [place] with [facility]don Picking with Andree Coss with [facility], Kenard Gower with Angelica Ran with [facility]. They all can not take it and some of them don't do LOG's. NCM contacted Lupita Leash with [facility], awaiting call back, Lupita Leash states they can not take referral for LOG .   NCM  Contacted patient's Mom, she states he will be coming home with her.  NCM informed her that she will be responsible for most of his care because [facility] services are not 24 hr care, they will just come out 2 to 3 times a week for 2 weeks for about 30 to 40 minutes.  She states that is fine. NCM informed her we have not gotten any HH agency that will take referral yet.   She also states she is not ready for patient to come home today, that she will need to get things ready for him at the house and that she will be coming to pick him up tomorrow. NCM informed MD of this information.  Patient needs w/chair and 3 n 1. This was ordered and referral made to [facility] for charity referral, he delivered the DME to  patient's room.  Patient has follow up apt ,listed on AVS.  Mom states that when he is discharged to have ambulance transport take him to [address]  [place] [address].  She states she will be there with him when she is not with her husband who she also takes care of.  When she is not there the patient's brother and cousin will be with him.  Plan for discharge tomorrow.          Expected Discharge Plan and Services           Expected Discharge Date: 07/12/19                                     Social Determinants of Health (SDOH) Interventions    Readmission Risk Interventions No flowsheet data found.

## 2019-07-12 NOTE — Progress Notes (Signed)
Inpatient Rehabilitation-Admissions Coordinator   Spoke with pt's mom over the phone today to get her final decision regarding CIR. Pt's mom still states she would like to take him home and not pursue CIR due to potential cost.   She is still wanting HEP's to guide her with his rehab at home. I will contact TOC team. AC will sign off.   Please call if questions.   Cheri Rous, OTR/L  Rehab Admissions Coordinator  [phone] 07/12/2019 3:41 PM

## 2019-07-13 LAB — COMPREHENSIVE METABOLIC PANEL
ALT: 132 U/L — ABNORMAL HIGH (ref 0–44)
AST: 93 U/L — ABNORMAL HIGH (ref 15–41)
Albumin: 2.3 g/dL — ABNORMAL LOW (ref 3.5–5.0)
Alkaline Phosphatase: 118 U/L (ref 38–126)
Anion gap: 10 (ref 5–15)
BUN: 17 mg/dL (ref 6–20)
CO2: 23 mmol/L (ref 22–32)
Calcium: 8.6 mg/dL — ABNORMAL LOW (ref 8.9–10.3)
Chloride: 105 mmol/L (ref 98–111)
Creatinine, Ser: 0.84 mg/dL (ref 0.61–1.24)
GFR calc Af Amer: >60 mL/min (ref >60)
GFR calc non Af Amer: >60 mL/min (ref >60)
Glucose, Bld: 95 mg/dL (ref 70–99)
Potassium: 4 mmol/L (ref 3.5–5.1)
Sodium: 138 mmol/L (ref 135–145)
Total Bilirubin: 0.7 mg/dL (ref 0.3–1.2)
Total Protein: 6.4 g/dL — ABNORMAL LOW (ref 6.5–8.1)

## 2019-07-13 LAB — GLUCOSE, CAPILLARY
Glucose-Capillary: 88 mg/dL (ref 70–99)
Glucose-Capillary: 124 mg/dL — ABNORMAL HIGH (ref 70–99)
Glucose-Capillary: 101 mg/dL — ABNORMAL HIGH (ref 70–99)
Glucose-Capillary: 110 mg/dL — ABNORMAL HIGH (ref 70–99)

## 2019-07-13 LAB — MAGNESIUM: Magnesium: 1.6 mg/dL — ABNORMAL LOW (ref 1.7–2.4)

## 2019-07-13 MED ORDER — MAGNESIUM SULFATE 2 GM/50ML IV SOLN
2.0000 g | Freq: Once | INTRAVENOUS | Status: AC
  Administered 2019-07-13: 08:00:00 2 g via INTRAVENOUS
  Filled 2019-07-13: qty 50

## 2019-07-13 NOTE — Progress Notes (Signed)
Spoke with pt mother. Mother wanted to "contest her sons discharge." Stated she does not want pt to be discharged today and is not able to come get him until Monday. This RN explained that the pt has discharge orders in and asked if mother would like other transportation to get pt home. Mother stated "no, I will get him Monday." This RN explained the conversation to charge nurse who then notified MD. MD is aware of the situation and changed patient's level of care to Med-Surg. A bed request has been made to transfer the patient to another unit as he no longer needs progressive care.  Hazle Nordmann, RN

## 2019-07-13 NOTE — Progress Notes (Addendum)
  PROGRESS NOTE  Patient seen and examined. No complaints, has not yet had breakfast.  Denies any shortness of breath.  Remains stable on room air.  Family has declined CIR which was recommended.  Magnesium repleted.  Stable to discharge home with family.  DME ordered.   Noralee Stain, DO [facility] 07/13/2019, 9:24 AM  Available via Epic secure chat 7am-7pm After these hours, please refer to coverage provider listed on [email or web address]

## 2019-07-13 NOTE — Progress Notes (Addendum)
Notified this am that pt's mother now stating that she can not bring pt home with her. Call made to the mother this am to discuss barriers- per TC this am- mother is stating that her spouse does not feel like they can care for pt in the home with current needs and is refusing to allow pt to come to their home. Mother reporting that she has called other family members to see if pt could go to any of their homes and no family member at this time allowing pt to come to their home either. Mom states pt does not have an apartment of his own as "baby mama" let his old apartment go when he came into the hospital- at this time pt is essentially "homeless" with no safe place to transition to today for discharge. Have asked mother to continue to actively work on finding patient a safe place to go with needed supervision and assistance in place.  Call made to [facility] with CIR to discuss option for inpt rehab- per conversation with Tresa Endo pt is now min guard with mobility and no longer is an appropriate patient for INPT rehab.  Follow up TC made to patient's mother to let her know rehab here at [facility] is no longer an option, reminded her that pt has been discharged and we need a disposition for safe discharge- she states she will continue to work with family to see if she can come up with a plan for a place for pt to go home to. CM will f/u with mother again tomorrow.  Have followed up with [facility] agencies and none of them will accept pt for charity care or LOG services at this time. DME has been delivered to room w/c and 3n1.

## 2019-07-13 NOTE — Progress Notes (Signed)
Physical Therapy Treatment Patient Details Name: Pedro Gomez MRN: [id number] DOB: 1981/01/25 Today's Date: 07/13/2019    History of Present Illness Pt is a 39 y.o. male admitted 06/30/19 post-VF cardiac arrest with downtime ~10 minutes; pt with HR but GCS of 3 upon arrival; h/o alcohol abuse vs polysubstance abuse. ETT 1/1-1/9. Pt with bilateral paraparesis. Head CT with no acute findings. MRI C and T-spine rule out spinal cord infarct. PMH includes polysubance abuse, afib.    PT Comments    Patient tolerated increased gait distance this session. Pt continues to require 24 hour assistance/supervision and use of RW for safety with mobility due to balance and cognitive deficits. W/c and 3 in 1 delivered to room however no RW yet--RN notified pt will need this for d/c. HEP handouts left in room to take home. Continue to progress as tolerated.     Follow Up Recommendations  Home health PT;Supervision/Assistance - 24 hour(pt family declining CIR at this time, recommend max HH services)     Equipment Recommendations  Wheelchair (measurements PT);Wheelchair cushion (measurements PT);3in1 (PT);Rolling walker with 5" wheels(pt family declining CIR, will need selected equipment to mobilize at home safely)    Recommendations for Other Services Rehab consult     Precautions / Restrictions Precautions Precautions: Fall Restrictions Weight Bearing Restrictions: No    Mobility  Bed Mobility Overal bed mobility: Modified Independent Bed Mobility: Supine to Sit              Transfers Overall transfer level: Needs assistance Equipment used: Rolling walker (2 wheeled) Transfers: Sit to/from Stand Sit to Stand: Min guard         General transfer comment: pt initially attempted to stand by pulling on RW and RW tipped over on top of pt; cues for safe hand placement given and pt attempted to pull on RW again and needed cues a second time to stand without pulling on  RW  Ambulation/Gait Ambulation/Gait assistance: Min guard;Mod assist;Min assist Gait Distance (Feet): (~200 ft total without and without AD) Assistive device: Rolling walker (2 wheeled);1 person hand held assist(assist at trunk with gait belt) Gait Pattern/deviations: Step-through pattern;Narrow base of support;Staggering right;Staggering left;Drifts right/left;Scissoring Gait velocity: decreased   General Gait Details: use of RW initially with cues for safe use and assist to steady at times; pt requires up to mod A for balance without AD; pt using rail in hallway at times and with assistance continues to have LOB and near scissoring gait at times   Stairs             Wheelchair Mobility    Modified Rankin (Stroke Patients Only)       Balance Overall balance assessment: Needs assistance Sitting-balance support: Feet supported Sitting balance-Leahy Scale: Fair     Standing balance support: Bilateral upper extremity supported;During functional activity;Single extremity supported Standing balance-Leahy Scale: Poor                              Cognition Arousal/Alertness: Awake/alert Behavior During Therapy: WFL for tasks assessed/performed;Flat affect Overall Cognitive Status: Impaired/Different from baseline Area of Impairment: Orientation;Memory;Following commands;Safety/judgement;Awareness;Problem solving                 Orientation Level: Disoriented to;Situation(utilized calender in room for date)   Memory: Decreased short-term memory Following Commands: Follows one step commands with increased time Safety/Judgement: Decreased awareness of safety;Decreased awareness of deficits   Problem Solving: Slow processing;Difficulty sequencing;Requires verbal cues;Requires  tactile cues        Exercises      General Comments        Pertinent Vitals/Pain Pain Assessment: No/denies pain    Home Living                      Prior  Function            PT Goals (current goals can now be found in the care plan section) Acute Rehab PT Goals Patient Stated Goal: go home Progress towards PT goals: Progressing toward goals    Frequency    Min 5X/week      PT Plan Current plan remains appropriate    Co-evaluation              AM-PAC PT "6 Clicks" Mobility   Outcome Measure  Help needed turning from your back to your side while in a flat bed without using bedrails?: A Little Help needed moving from lying on your back to sitting on the side of a flat bed without using bedrails?: A Little Help needed moving to and from a bed to a chair (including a wheelchair)?: A Little Help needed standing up from a chair using your arms (e.g., wheelchair or bedside chair)?: A Little Help needed to walk in hospital room?: A Little Help needed climbing 3-5 steps with a railing? : A Lot 6 Click Score: 17    End of Session Equipment Utilized During Treatment: Gait belt Activity Tolerance: Patient tolerated treatment well Patient left: with call bell/phone within reach;in chair;with chair alarm set Nurse Communication: Mobility status PT Visit Diagnosis: Muscle weakness (generalized) (M62.81);Difficulty in walking, not elsewhere classified (R26.2);Apraxia (R48.2);Other symptoms and signs involving the nervous system (Y60.630)     Time: 1601-0932 PT Time Calculation (min) (ACUTE ONLY): 20 min  Charges:  $Gait Training: 8-22 mins                     Earney Navy, PTA Acute Rehabilitation Services Pager: [phone] Office: [phone]     Darliss Cheney 07/13/2019, 1:28 PM

## 2019-07-14 LAB — COMPREHENSIVE METABOLIC PANEL
ALT: 151 U/L — ABNORMAL HIGH (ref 0–44)
AST: 95 U/L — ABNORMAL HIGH (ref 15–41)
Albumin: 2.4 g/dL — ABNORMAL LOW (ref 3.5–5.0)
Alkaline Phosphatase: 105 U/L (ref 38–126)
Anion gap: 10 (ref 5–15)
BUN: 19 mg/dL (ref 6–20)
CO2: 25 mmol/L (ref 22–32)
Calcium: 8.7 mg/dL — ABNORMAL LOW (ref 8.9–10.3)
Chloride: 102 mmol/L (ref 98–111)
Creatinine, Ser: 0.92 mg/dL (ref 0.61–1.24)
GFR calc Af Amer: >60 mL/min (ref >60)
GFR calc non Af Amer: >60 mL/min (ref >60)
Glucose, Bld: 101 mg/dL — ABNORMAL HIGH (ref 70–99)
Potassium: 4.2 mmol/L (ref 3.5–5.1)
Sodium: 137 mmol/L (ref 135–145)
Total Bilirubin: 0.7 mg/dL (ref 0.3–1.2)
Total Protein: 6.4 g/dL — ABNORMAL LOW (ref 6.5–8.1)

## 2019-07-14 LAB — GLUCOSE, CAPILLARY
Glucose-Capillary: 102 mg/dL — ABNORMAL HIGH (ref 70–99)
Glucose-Capillary: 92 mg/dL (ref 70–99)

## 2019-07-14 LAB — MAGNESIUM: Magnesium: 1.8 mg/dL (ref 1.7–2.4)

## 2019-07-14 NOTE — TOC Transition Note (Signed)
Transition of Care [facility]) - CM/SW Discharge Note Donn Pierini RN, BSN Transitions of Care Unit 4E- RN Case Manager [phone]   Patient Details  Name: Pedro Gomez MRN: [id number] Date of Birth: 03-20-1981  Transition of Care [facility]) CM/SW Contact:  Darrold Span, RN Phone Number: 07/14/2019, 2:59 PM   Clinical Narrative:    Notified by charge RN that mom is here on unit to pick patient up and take home. DME at bedside, no HH could be arranged due to no HH agency would accept referral. MD to be notified by RN that pt can be discharged home with mother. PCP f/u arranged.    Final next level of care: Home/Self Care Barriers to Discharge: Barriers Resolved   Patient Goals and CMS Choice Patient states their goals for this hospitalization and ongoing recovery are:: return home with family CMS Medicare.gov Compare Post Acute Care list provided to:: Patient Represenative (must comment)(mom)    Discharge Placement               Home with Mother        Discharge Plan and Services                DME Arranged: 3-N-1, Walker rolling DME Agency: [employer] Date DME Agency Contacted: 07/12/19   Representative spoke with at DME Agency: [email or web address]            Social Determinants of Health (SDOH) Interventions     Readmission Risk Interventions No flowsheet data found.

## 2019-07-14 NOTE — Progress Notes (Signed)
Physical Therapy Treatment Patient Details Name: Pedro Gomez MRN: [id number] DOB: [birth year] Today's Date: 07/14/2019    History of Present Illness Pt is a 39 y.o. male admitted 06/30/19 post-VF cardiac arrest with downtime ~10 minutes; pt with HR but GCS of 3 upon arrival; h/o alcohol abuse vs polysubstance abuse. ETT 1/1-1/9. Pt with bilateral paraparesis. Head CT with no acute findings. MRI C and T-spine rule out spinal cord infarct. PMH includes polysubance abuse, afib.    PT Comments    Patient received in bed, concerned that he should be going home today, unable to reach his mother, unable to eat for some reason. RN notified. Patient agrees to PT session. Ambulated 250 feet with RW and min guard. Occasional cues for safety with mobility. NO LOB. Performed standing LE strengthening exercises. Patient will continue to benefit from skilled PT while here to improve safety with mobility and strength.        Follow Up Recommendations  Home health PT;Supervision/Assistance - 24 hour     Equipment Recommendations  Rolling walker with 5" wheels    Recommendations for Other Services       Precautions / Restrictions Precautions Precautions: Fall Restrictions Weight Bearing Restrictions: No    Mobility  Bed Mobility Overal bed mobility: Modified Independent Bed Mobility: Supine to Sit     Supine to sit: HOB elevated;Supervision        Transfers Overall transfer level: Needs assistance Equipment used: Rolling walker (2 wheeled) Transfers: Sit to/from Stand Sit to Stand: Min guard         General transfer comment: Patient requires cues to push up from seated surface.  Ambulation/Gait Ambulation/Gait assistance: Min guard Gait Distance (Feet): 250 Feet Assistive device: Rolling walker (2 wheeled);1 person hand held assist Gait Pattern/deviations: Step-through pattern;Drifts right/left;Narrow base of support Gait velocity: WNL   General Gait Details: patient  ambulated with RW in hallway, normal pace, occasional cues for safety especially with turning. At times tends to get too close to RW.   Stairs             Wheelchair Mobility    Modified Rankin (Stroke Patients Only)       Balance Overall balance assessment: Needs assistance Sitting-balance support: Feet supported Sitting balance-Leahy Scale: Good     Standing balance support: Bilateral upper extremity supported;During functional activity Standing balance-Leahy Scale: Fair Standing balance comment: reliant on B UE support with dynamic standing activities for safety                            Cognition Arousal/Alertness: Awake/alert Behavior During Therapy: WFL for tasks assessed/performed Overall Cognitive Status: No family/caregiver present to determine baseline cognitive functioning Area of Impairment: Safety/judgement;Awareness;Problem solving                 Orientation Level: Disoriented to;Situation   Memory: Decreased short-term memory Following Commands: Follows one step commands with increased time Safety/Judgement: Decreased awareness of safety;Decreased awareness of deficits Awareness: Intellectual Problem Solving: Requires verbal cues General Comments: Pt with poor navigation of RW, needs VC to avoid objects      Exercises Other Exercises Other Exercises: Standing heel raises, hip abd, marching and squats x 10 reps each bilateral UE support on counter    General Comments        Pertinent Vitals/Pain Pain Assessment: No/denies pain    Home Living  Prior Function            PT Goals (current goals can now be found in the care plan section) Acute Rehab PT Goals Patient Stated Goal: go home PT Goal Formulation: With patient Time For Goal Achievement: 07/22/19 Potential to Achieve Goals: Good Progress towards PT goals: Progressing toward goals    Frequency    Min 5X/week      PT Plan  Current plan remains appropriate    Co-evaluation              AM-PAC PT "6 Clicks" Mobility   Outcome Measure  Help needed turning from your back to your side while in a flat bed without using bedrails?: None Help needed moving from lying on your back to sitting on the side of a flat bed without using bedrails?: None Help needed moving to and from a bed to a chair (including a wheelchair)?: A Little Help needed standing up from a chair using your arms (e.g., wheelchair or bedside chair)?: A Little   Help needed climbing 3-5 steps with a railing? : A Little 6 Click Score: 17    End of Session Equipment Utilized During Treatment: Gait belt Activity Tolerance: Patient tolerated treatment well Patient left: with call bell/phone within reach;with bed alarm set;in bed Nurse Communication: Mobility status PT Visit Diagnosis: Unsteadiness on feet (R26.81);Other abnormalities of gait and mobility (R26.89);Other symptoms and signs involving the nervous system (R29.898)     Time: 1191-4782 PT Time Calculation (min) (ACUTE ONLY): 19 min  Charges:  $Therapeutic Exercise: 8-22 mins                     Marianna Cid, PT, GCS 07/14/19,11:09 AM

## 2019-07-14 NOTE — Progress Notes (Signed)
  PROGRESS NOTE  Patient was supposed to discharge 1/13, but discharge held due to safe dispo plan pending family finding a place for patient to go to on discharge. Patient seen and examined today; has no physical complaints and denies abdominal pain or nausea, vomiting. Patient's mom now at hospital to take patient home today with her.   See discharge summary dated 1/13.   Noralee Stain, DO [facility] 07/14/2019, 1:49 PM  Available via Epic secure chat 7am-7pm After these hours, please refer to coverage provider listed on [email or web address]

## 2019-07-14 NOTE — Progress Notes (Signed)
PROGRESS NOTE    JOHNE BUCKLE  WNI:[id number] DOB: 1981-01-31 DOA: 06/30/2019 PCP: Patient, No Pcp Per     Brief Narrative:  From H&P per Dr. Gilford Raid: "Britten Seyfried is a75 y.o.M with PMH of Atrial fibrillation and small PFO diagnosed in 2019 not on any medical therapyor anti-coagulation, alcohol, tobacco and marijuana use who was in his usual state of health untilhe collapsed in the car while his girlfriend was driving. She reports that they had been doing errands today, he ate and watched football and had two alcoholic drinks, he was not complaining of anything. They got in the car around 8pm and she noticed he was more quiet than normal, then he made a yawning noise and slumped over. She pulled over and called 911 then pulled him out of the car and attempted to start CPR before EMS arrived, girlfriend estimates it was about 5 minutes from the time he collapsed to when EMS took over. CPR was continued for 4 minutes with ROSC after defibrillation and Epix1.  On arrival to the ED pt was intubated and EKG showed sinus rhythm, CT head without acute findings, labs significant for negative troponin, cocaine andTetrahydrocannabinolpositive, K 2.9, CO2 17, creatinine 1.7 and AST/ALT 66. He was started on cooling protocol and PCCM consulted for admission."  Interim: During his hospitalization, he was treated for aspiration pneumonia.  He was eventually extubated on 07/08/2019.  Patient had some acute metabolic encephalopathy and was seen by neurology.  He was also given tube feeds through cortrak.  Ileus resolved.  Cortrak removed once patient's p.o. intake improved.  Patient was evaluated by physical therapy who recommended CIR which family declined.  Patient was supposed to discharge home with family 1/13 but patient's family refused to take patient home.   New events last 24 hours / Subjective: No complaints of abdominal pain, SOB.   Assessment & Plan:   Active Problems:   Cardiac  arrest (HCC)   Encephalopathy acute   Cocaine abuse (HCC)   Polysubstance abuse (HCC)   Paroxysmal atrial fibrillation (HCC)   PFO (patent foramen ovale)   Current smoker   Acute respiratory failure with hypoxia secondary to cardiacarrest/aspiration pneumonia Patient was intubated during cardiac arrest and admitted to the critical care service. Initially there was no infectious or parapneumonic abnormalities noted on radiography. Patient noted to have an aspiration event the morning of 07/02/2019 with a new right mid to lower lobe opacification consistent with aspiration along with hypoxia.   Respiratory culture consistent with normal respiratory flora.  Patient status post full course of cefepime for aspiration pneumonia from 07/02/2019-07/07/2019. Patient extubated on 07/08/2019.  Now on room air  Acute metabolic encephalopathy Likely secondary to decreased cerebral perfusion from cardiac arrest. Patient noted to be intubated and sedated after cardiac arrest and was placed on targeted temperature management. Initial EEG done was negative for any seizure activity. Patient noted to have some twitching temporal muscle and abnormal eye movement on 07/04/2019 and repeat EEG done showed no seizure activity. MRI brain done on 07/04/2019 with no acute abnormalities noted. Patient was placed on the Precedex drip which has subsequently been discontinued. Patient seen by neurology.  Stable  Cardiac arrest/cardiogenic shock Felt secondary to stenting from cocaine versus prolonged arrhythmia. 2D echo done with a EF of 50 to 55%, mildly increased LVH, no wall motion abnormalities.   Ileus Status post tube feeds. Patient on bowel regimen of prokinetics. Cortrack placed 07/07/2019. Tube feeds resumed. Patient with multiple bowel movements with flatus.  Ileus resolved.  Resolved and cortrak removed.   Normocytic anemia Hemoglobin noted to be trending down during the hospitalization and aspirin  discontinued on 07/06/2019. Patient with no overt bleeding.Hemoglobin stable  Elevated liver enzyme Continue to monitor.  Stop Tylenol. Check RUQ [place]. Denies abdominal pain.    DVT prophylaxis: Lovenox  Code Status: Full Family Communication: None at bedside Disposition Plan: Pending placement   Consultants:   PCCM admission  Neurology   Antimicrobials:  Anti-infectives (From admission, onward)   Start     Dose/Rate Route Frequency Ordered Stop   07/03/19 0845  ceFEPIme (MAXIPIME) 2 g in sodium chloride 0.9 % 100 mL IVPB     2 g 200 mL/hr over 30 Minutes Intravenous Every 8 hours 07/03/19 0836 07/06/19 2224        Objective: Vitals:   07/13/19 0757 07/13/19 1128 07/14/19 0425 07/14/19 0844  BP: 107/69 114/78 114/73 113/78  Pulse:  96  82  Resp: 20 20 (!) 22 18  Temp: 98.5 F (36.9 C) (!) 97.4 F (36.3 C) 98.6 F (37 C) 98.9 F (37.2 C)  TempSrc: Axillary Oral Oral Oral  SpO2: 98% 96% 93% 94%  Weight:   69.3 kg   Height:        Intake/Output Summary (Last 24 hours) at 07/14/2019 1143 Last data filed at 07/14/2019 0845 Gross per 24 hour  Intake 840.66 ml  Output 700 ml  Net 140.66 ml   Filed Weights   07/09/19 0600 07/10/19 0500 07/14/19 0425  Weight: 75.6 kg 82.3 kg 69.3 kg    Examination:  General exam: Appears calm and comfortable  Respiratory system: Clear to auscultation. Respiratory effort normal. No respiratory distress. No conversational dyspnea.  Cardiovascular system: S1 & S2 heard, RRR. No murmurs. No pedal edema. Gastrointestinal system: Abdomen is nondistended, soft and nontender. Normal bowel sounds heard. Central nervous system: Alert and oriented. No focal neurological deficits. Speech clear.  Extremities: Symmetric in appearance  Skin: No rashes, lesions or ulcers on exposed skin  Psychiatry: Judgement and insight appear normal. Mood & affect appropriate.   Data Reviewed: I have personally reviewed following labs and imaging  studies  CBC: Recent Labs  Lab 07/08/19 0411 07/09/19 0314 07/10/19 0356 07/11/19 0411 07/12/19 0453  WBC 7.3 14.2* 13.2* 11.6* 8.9  NEUTROABS  --   --   --   --  6.1  HGB 10.0* 11.3* 10.9* 10.2* 10.6*  HCT 29.4* 34.1* 33.5* 31.0* 31.1*  MCV 92.5 92.2 93.3 92.3 90.1  PLT 198 296 396 458* 538*   Basic Metabolic Panel: Recent Labs  Lab 07/08/19 0411 07/08/19 0411 07/09/19 0314 07/09/19 0314 07/10/19 0356 07/11/19 0411 07/12/19 0453 07/13/19 0305 07/14/19 0345  NA 143   < > 143   < > 144 143 139 138 137  K 3.2*   < > 3.8   < > 3.4* 3.8 3.5 4.0 4.2  CL 111   < > 110   < > 109 111 106 105 102  CO2 22   < > 23   < > 26 26 26 23 25   GLUCOSE 105*   < > 123*   < > 120* 104* 97 95 101*  BUN 28*   < > 32*   < > 23* 19 15 17 19   CREATININE 0.89   < > 0.80   < > 0.78 0.81 0.79 0.84 0.92  CALCIUM 7.8*   < > 8.3*   < > 8.2* 8.3* 8.2* 8.6* 8.7*  MG 1.6*   < > 2.1   < > 1.7 1.6* 1.7 1.6* 1.8  PHOS 2.6  --  3.7  --  2.8 2.4* 3.0  --   --    < > = values in this interval not displayed.   GFR: Estimated Creatinine Clearance: 106.7 mL/min (by C-G formula based on SCr of 0.92 mg/dL). Liver Function Tests: Recent Labs  Lab 07/10/19 0356 07/11/19 0411 07/12/19 0453 07/13/19 0305 07/14/19 0345  AST 54* 66* 103* 93* 95*  ALT 63* 72* 114* 132* 151*  ALKPHOS 101 104 109 118 105  BILITOT 0.7 1.0 0.8 0.7 0.7  PROT 5.6* 5.6* 5.7* 6.4* 6.4*  ALBUMIN 2.1* 2.1* 2.2* 2.3* 2.4*   No results for input(s): LIPASE, AMYLASE in the last 168 hours. No results for input(s): AMMONIA in the last 168 hours. Coagulation Profile: No results for input(s): INR, PROTIME in the last 168 hours. Cardiac Enzymes: No results for input(s): CKTOTAL, CKMB, CKMBINDEX, TROPONINI in the last 168 hours. BNP (last 3 results) No results for input(s): PROBNP in the last 8760 hours. HbA1C: No results for input(s): HGBA1C in the last 72 hours. CBG: Recent Labs  Lab 07/13/19 0642 07/13/19 1124 07/13/19 1618  07/13/19 2046 07/14/19 0602  GLUCAP 88 124* 101* 110* 102*   Lipid Profile: No results for input(s): CHOL, HDL, LDLCALC, TRIG, CHOLHDL, LDLDIRECT in the last 72 hours. Thyroid Function Tests: No results for input(s): TSH, T4TOTAL, FREET4, T3FREE, THYROIDAB in the last 72 hours. Anemia Panel: No results for input(s): VITAMINB12, FOLATE, FERRITIN, TIBC, IRON, RETICCTPCT in the last 72 hours. Sepsis Labs: No results for input(s): PROCALCITON, LATICACIDVEN in the last 168 hours.  No results found for this or any previous visit (from the past 240 hour(s)).    Radiology Studies: No results found.    Scheduled Meds: . Chlorhexidine Gluconate Cloth  6 each Topical Daily  . enoxaparin (LOVENOX) injection  40 mg Subcutaneous Q24H  . feeding supplement (ENSURE ENLIVE)  237 mL Oral TID BM  . folic acid  1 mg Oral Daily  . multivitamin with minerals  1 tablet Oral Daily  . nicotine  14 mg Transdermal Daily  . pantoprazole  40 mg Oral Q0600  . polyethylene glycol  17 g Per Tube Daily  . sennosides  10 mL Oral QHS  . sodium chloride flush  10-40 mL Intracatheter Q12H  . thiamine  100 mg Oral Daily   Continuous Infusions: . sodium chloride 10 mL/hr at 07/09/19 0200  . sodium chloride 250 mL (07/08/19 0845)     LOS: 13 days      Time spent: 25 minutes   Noralee Stain, DO [facility] 07/14/2019, 11:43 AM   Available via Epic secure chat 7am-7pm After these hours, please refer to coverage provider listed on [email or web address]

## 2019-07-20 ENCOUNTER — Encounter (HOSPITAL_COMMUNITY): Payer: Self-pay

## 2019-08-03 ENCOUNTER — Encounter: Payer: Self-pay | Admitting: Nurse Practitioner

## 2019-08-03 ENCOUNTER — Ambulatory Visit (INDEPENDENT_AMBULATORY_CARE_PROVIDER_SITE_OTHER): Payer: Self-pay | Admitting: Nurse Practitioner

## 2019-08-03 ENCOUNTER — Other Ambulatory Visit: Payer: Self-pay

## 2019-08-03 VITALS — BP 112/71 | HR 78 | Temp 98.3°F | Ht 74.0 in | Wt 174.4 lb

## 2019-08-03 DIAGNOSIS — F1910 Other psychoactive substance abuse, uncomplicated: Secondary | ICD-10-CM

## 2019-08-03 DIAGNOSIS — Z0000 Encounter for general adult medical examination without abnormal findings: Secondary | ICD-10-CM

## 2019-08-03 DIAGNOSIS — Z720 Tobacco use: Secondary | ICD-10-CM

## 2019-08-03 DIAGNOSIS — Z8679 Personal history of other diseases of the circulatory system: Secondary | ICD-10-CM

## 2019-08-03 LAB — POCT GLYCOSYLATED HEMOGLOBIN (HGB A1C): Hemoglobin A1C: 4.9 % (ref 4.0–5.6)

## 2019-08-03 LAB — POCT URINALYSIS DIPSTICK
Bilirubin, UA: NEGATIVE
Blood, UA: NEGATIVE
Glucose, UA: NEGATIVE
Ketones, UA: NEGATIVE
Nitrite, UA: NEGATIVE
Protein, UA: NEGATIVE
Spec Grav, UA: >=1.03 — AB (ref 1.010–1.025)
Urobilinogen, UA: 0.2 E.U./dL
pH, UA: 5.5 (ref 5.0–8.0)

## 2019-08-03 LAB — GLUCOSE, POCT (MANUAL RESULT ENTRY): POC Glucose: 94 mg/dl (ref 70–99)

## 2019-08-03 NOTE — Patient Instructions (Addendum)
Smoking Tobacco Information, Adult Smoking tobacco can be harmful to your health. Tobacco contains a poisonous (toxic), colorless chemical called nicotine. Nicotine is addictive. It changes the brain and can make it hard to stop smoking. Tobacco also has other toxic chemicals that can hurt your body and raise your risk of many cancers. How can smoking tobacco affect me? Smoking tobacco puts you at risk for:  Cancer. Smoking is most commonly associated with lung cancer, but can also lead to cancer in other parts of the body.  Chronic obstructive pulmonary disease (COPD). This is a long-term lung condition that makes it hard to breathe. It also gets worse over time.  High blood pressure (hypertension), heart disease, stroke, or heart attack.  Lung infections, such as pneumonia.  Cataracts. This is when the lenses in the eyes become clouded.  Digestive problems. This may include peptic ulcers, heartburn, and gastroesophageal reflux disease (GERD).  Oral health problems, such as gum disease and tooth loss.  Loss of taste and smell. Smoking can affect your appearance by causing:  Wrinkles.  Yellow or stained teeth, fingers, and fingernails. Smoking tobacco can also affect your social life, because:  It may be challenging to find places to smoke when away from home. Many workplaces, restaurants, hotels, and public places are tobacco-free.  Smoking is expensive. This is due to the cost of tobacco and the long-term costs of treating health problems from smoking.  Secondhand smoke may affect those around you. Secondhand smoke can cause lung cancer, breathing problems, and heart disease. Children of smokers have a higher risk for: ? Sudden infant death syndrome (SIDS). ? Ear infections. ? Lung infections. If you currently smoke tobacco, quitting now can help you:  Lead a longer and healthier life.  Look, smell, breathe, and feel better over time.  Save money.  Protect others from the  harms of secondhand smoke. What actions can I take to prevent health problems? Quit smoking   Do not start smoking. Quit if you already do.  Make a plan to quit smoking and commit to it. Look for programs to help you and ask your health care provider for recommendations and ideas.  Set a date and write down all the reasons you want to quit.  Let your friends and family know you are quitting so they can help and support you. Consider finding friends who also want to quit. It can be easier to quit with someone else, so that you can support each other.  Talk with your health care provider about using nicotine replacement medicines to help you quit, such as gum, lozenges, patches, sprays, or pills.  Do not replace cigarette smoking with electronic cigarettes, which are commonly called e-cigarettes. The safety of e-cigarettes is not known, and some may contain harmful chemicals.  If you try to quit but return to smoking, stay positive. It is common to slip up when you first quit, so take it one day at a time.  Be prepared for cravings. When you feel the urge to smoke, chew gum or suck on hard candy. Lifestyle  Stay busy and take care of your body.  Drink enough fluid to keep your urine pale yellow.  Get plenty of exercise and eat a healthy diet. This can help prevent weight gain after quitting.  Monitor your eating habits. Quitting smoking can cause you to have a larger appetite than when you smoke.  Find ways to relax. Go out with friends or family to a movie or a restaurant   where people do not smoke.  Ask your health care provider about having regular tests (screenings) to check for cancer. This may include blood tests, imaging tests, and other tests.  Find ways to manage your stress, such as meditation, yoga, or exercise. Where to find support To get support to quit smoking, consider:  Asking your health care provider for more information and resources.  Taking classes to learn  more about quitting smoking.  Looking for local organizations that offer resources about quitting smoking.  Joining a support group for people who want to quit smoking in your local community.  Calling the smokefree.gov counselor helpline: 1-800-Quit-Now (605) 529-1315) Where to find more information You may find more information about quitting smoking from:  HelpGuide.org: www.helpguide.org  https://hall.com/: smokefree.gov  American Lung Association: www.lung.org Contact a health care provider if you:  Have problems breathing.  Notice that your lips, nose, or fingers turn blue.  Have chest pain.  Are coughing up blood.  Feel faint or you pass out.  Have other health changes that cause you to worry. Summary  Smoking tobacco can negatively affect your health, the health of those around you, your finances, and your social life.  Do not start smoking. Quit if you already do. If you need help quitting, ask your health care provider.  Think about joining a support group for people who want to quit smoking in your local community. There are many effective programs that will help you to quit this behavior. This information is not intended to replace advice given to you by your health care provider. Make sure you discuss any questions you have with your health care provider. Document Revised: 03/10/2019 Document Reviewed: 06/30/2016 Elsevier Patient Education  [place].  Atrial Fibrillation  Atrial fibrillation is a type of heartbeat that is irregular or fast. If you have this condition, your heart beats without any order. This makes it hard for your heart to pump blood in a normal way. Atrial fibrillation may come and go, or it may become a long-lasting problem. If this condition is not treated, it can put you at higher risk for stroke, heart failure, and other heart problems. What are the causes? This condition may be caused by diseases that damage the heart. They  include:  High blood pressure.  Heart failure.  Heart valve disease.  Heart surgery. Other causes include:  Diabetes.  Thyroid disease.  Being overweight.  Kidney disease. Sometimes the cause is not known. What increases the risk? You are more likely to develop this condition if:  You are older.  You smoke.  You exercise often and very hard.  You have a family history of this condition.  You are a man.  You use drugs.  You drink a lot of alcohol.  You have lung conditions, such as emphysema, pneumonia, or COPD.  You have sleep apnea. What are the signs or symptoms? Common symptoms of this condition include:  A feeling that your heart is beating very fast.  Chest pain or discomfort.  Feeling short of breath.  Suddenly feeling light-headed or weak.  Getting tired easily during activity.  Fainting.  Sweating. In some cases, there are no symptoms. How is this treated? Treatment for this condition depends on underlying conditions and how you feel when you have atrial fibrillation. They include:  Medicines to: ? Prevent blood clots. ? Treat heart rate or heart rhythm problems.  Using devices, such as a pacemaker, to correct heart rhythm problems.  Doing surgery to  remove the part of the heart that sends bad signals.  Closing an area where clots can form in the heart (left atrial appendage). In some cases, your doctor will treat other underlying conditions. Follow these instructions at home: Medicines  Take over-the-counter and prescription medicines only as told by your doctor.  Do not take any new medicines without first talking to your doctor.  If you are taking blood thinners: ? Talk with your doctor before you take any medicines that have aspirin or NSAIDs, such as ibuprofen, in them. ? Take your medicine exactly as told by your doctor. Take it at the same time each day. ? Avoid activities that could hurt or bruise you. Follow instructions  about how to prevent falls. ? Wear a bracelet that says you are taking blood thinners. Or, carry a card that lists what medicines you take. Lifestyle      Do not use any products that have nicotine or tobacco in them. These include cigarettes, e-cigarettes, and chewing tobacco. If you need help quitting, ask your doctor.  Eat heart-healthy foods. Talk with your doctor about the right eating plan for you.  Exercise regularly as told by your doctor.  Do not drink alcohol.  Lose weight if you are overweight.  Do not use drugs, including cannabis. General instructions  If you have a condition that causes breathing to stop for a short period of time (apnea), treat it as told by your doctor.  Keep a healthy weight. Do not use diet pills unless your doctor says they are safe for you. Diet pills may make heart problems worse.  Keep all follow-up visits as told by your doctor. This is important. Contact a doctor if:  You notice a change in the speed, rhythm, or strength of your heartbeat.  You are taking a blood-thinning medicine and you get more bruising.  You get tired more easily when you move or exercise.  You have a sudden change in weight. Get help right away if:   You have pain in your chest or your belly (abdomen).  You have trouble breathing.  You have side effects of blood thinners, such as blood in your vomit, poop (stool), or pee (urine), or bleeding that cannot stop.  You have any signs of a stroke. "BE FAST" is an easy way to remember the main warning signs: ? B - Balance. Signs are dizziness, sudden trouble walking, or loss of balance. ? E - Eyes. Signs are trouble seeing or a change in how you see. ? F - Face. Signs are sudden weakness or loss of feeling in the face, or the face or eyelid drooping on one side. ? A - Arms. Signs are weakness or loss of feeling in an arm. This happens suddenly and usually on one side of the body. ? S - Speech. Signs are sudden  trouble speaking, slurred speech, or trouble understanding what people say. ? T - Time. Time to call emergency services. Write down what time symptoms started.  You have other signs of a stroke, such as: ? A sudden, very bad headache with no known cause. ? Feeling like you may vomit (nausea). ? Vomiting. ? A seizure. These symptoms may be an emergency. Do not wait to see if the symptoms will go away. Get medical help right away. Call your local emergency services (911 in the [place] Do not drive yourself to the hospital. Summary  Atrial fibrillation is a type of heartbeat that is irregular or fast.  You are at higher risk of this condition if you smoke, are older, have diabetes, or are overweight.  Follow your doctor's instructions about medicines, diet, exercise, and follow-up visits.  Get help right away if you have signs or symptoms of a stroke.  Get help right away if you cannot catch your breath, or you have chest pain or discomfort. This information is not intended to replace advice given to you by your health care provider. Make sure you discuss any questions you have with your health care provider. Document Revised: 12/07/2018 Document Reviewed: 12/07/2018 Elsevier Patient Education  2020 [employer].

## 2019-08-04 LAB — CBC WITH DIFFERENTIAL/PLATELET
Basophils Absolute: 0 10*3/uL (ref 0.0–0.2)
Basos: 1 %
EOS (ABSOLUTE): 0.4 10*3/uL (ref 0.0–0.4)
Eos: 9 %
Hematocrit: 35.6 % — ABNORMAL LOW (ref 37.5–51.0)
Hemoglobin: 12 g/dL — ABNORMAL LOW (ref 13.0–17.7)
Immature Grans (Abs): 0 10*3/uL (ref 0.0–0.1)
Immature Granulocytes: 0 %
Lymphocytes Absolute: 1.5 10*3/uL (ref 0.7–3.1)
Lymphs: 37 %
MCH: 31.4 pg (ref 26.6–33.0)
MCHC: 33.7 g/dL (ref 31.5–35.7)
MCV: 93 fL (ref 79–97)
Monocytes Absolute: 0.4 10*3/uL (ref 0.1–0.9)
Monocytes: 10 %
Neutrophils Absolute: 1.7 10*3/uL (ref 1.4–7.0)
Neutrophils: 43 %
Platelets: 297 10*3/uL (ref 150–450)
RBC: 3.82 x10E6/uL — ABNORMAL LOW (ref 4.14–5.80)
RDW: 13.6 % (ref 11.6–15.4)
WBC: 4 10*3/uL (ref 3.4–10.8)

## 2019-08-04 LAB — COMP. METABOLIC PANEL (12)
AST: 19 IU/L (ref 0–40)
Albumin/Globulin Ratio: 1.6 (ref 1.2–2.2)
Albumin: 4.1 g/dL (ref 4.0–5.0)
Alkaline Phosphatase: 83 IU/L (ref 39–117)
BUN/Creatinine Ratio: 8 — ABNORMAL LOW (ref 9–20)
BUN: 8 mg/dL (ref 6–20)
Bilirubin Total: 0.7 mg/dL (ref 0.0–1.2)
Calcium: 9.5 mg/dL (ref 8.7–10.2)
Chloride: 107 mmol/L — ABNORMAL HIGH (ref 96–106)
Creatinine, Ser: 0.96 mg/dL (ref 0.76–1.27)
GFR calc Af Amer: 115 mL/min/{1.73_m2} (ref >59)
GFR calc non Af Amer: 100 mL/min/{1.73_m2} (ref >59)
Globulin, Total: 2.6 g/dL (ref 1.5–4.5)
Glucose: 84 mg/dL (ref 65–99)
Potassium: 4.4 mmol/L (ref 3.5–5.2)
Sodium: 144 mmol/L (ref 134–144)
Total Protein: 6.7 g/dL (ref 6.0–8.5)

## 2019-08-05 NOTE — Progress Notes (Signed)
New Patient Office Visit  Subjective:  Patient ID: Pedro Gomez, male    DOB: [birth year]  Age: 39 y.o. MRN: [id number]  CC:  Chief Complaint  Patient presents with  . New Patient (Initial Visit)    Est care  . Hospitalization Follow-up    07/11/2018 Acute Respiratory Failure with Hypoxia / CA/ Pneumonia  . Knee Pain    HPI Pedro Gomez presents establish care.  He status post cardiac arrest 06/30/2019 his hospitalization was approximately 2 weeks.  He has a history of polysubstance abuse and atrial fibrillation.  UDS was abnormal in the ER.  He is not on any medications.  He admits that he is doing well overall he does have soreness in his chest.  This is worse with certain movements.  He denies any shortness of breath, chest pains, palpitations or arrhythmia. He admits that he does have some stiffness in his knee.  He denies any swelling in his legs.  Past Medical History:  Diagnosis Date  . Atrial fibrillation ([place])   . Polysubstance abuse ([place])   . Tobacco abuse     Past Surgical History:  Procedure Laterality Date  . TEE WITHOUT CARDIOVERSION N/A 12/13/2017   Procedure: TRANSESOPHAGEAL ECHOCARDIOGRAM (TEE);  Surgeon: Pixie Casino, MD;  Location: [facility] ENDOSCOPY;  Service: Cardiovascular;  Laterality: N/A;    Family History  Problem Relation Age of Onset  . Hypertension Mother   . Hypertension Father     Social History   Socioeconomic History  . Marital status: Single    Spouse name: Not on file  . Number of children: Not on file  . Years of education: Not on file  . Highest education level: Not on file  Occupational History  . Not on file  Tobacco Use  . Smoking status: Current Every Day Smoker    Types: Cigarettes  . Smokeless tobacco: Never Used  Substance and Sexual Activity  . Alcohol use: Yes  . Drug use: Yes    Types: Marijuana  . Sexual activity: Yes  Other Topics Concern  . Not on file  Social History Narrative   ** Merged History  Encounter **       Social Determinants of Health   Financial Resource Strain:   . Difficulty of Paying Living Expenses: Not on file  Food Insecurity:   . Worried About Charity fundraiser in the Last Year: Not on file  . Ran Out of Food in the Last Year: Not on file  Transportation Needs:   . Lack of Transportation (Medical): Not on file  . Lack of Transportation (Non-Medical): Not on file  Physical Activity:   . Days of Exercise per Week: Not on file  . Minutes of Exercise per Session: Not on file  Stress:   . Feeling of Stress : Not on file  Social Connections:   . Frequency of Communication with Friends and Family: Not on file  . Frequency of Social Gatherings with Friends and Family: Not on file  . Attends Religious Services: Not on file  . Active Member of Clubs or Organizations: Not on file  . Attends Archivist Meetings: Not on file  . Marital Status: Not on file  Intimate Partner Violence:   . Fear of Current or Ex-Partner: Not on file  . Emotionally Abused: Not on file  . Physically Abused: Not on file  . Sexually Abused: Not on file    ROS Review of Systems  Constitutional: Negative.  HENT: Negative.   Eyes: Negative.   Respiratory: Negative.   Cardiovascular:       Chest discomfort from CPR  Gastrointestinal: Negative.   Endocrine: Negative.   Genitourinary: Negative.   Musculoskeletal: Negative.   Skin: Negative.   Allergic/Immunologic: Negative.   Neurological: Negative.   Hematological: Negative.   Psychiatric/Behavioral: Negative.     Objective:   Today's Vitals: BP 112/71   Pulse 78   Temp 98.3 F (36.8 C) (Oral)   Ht 6\' 2"  (1.88 m)   Wt 174 lb 6.4 oz (79.1 kg)   SpO2 100%   BMI 22.39 kg/m   Physical Exam Constitutional:      Appearance: Normal appearance. He is normal weight.  HENT:     Head: Normocephalic.     Right Ear: Tympanic membrane normal.     Left Ear: Tympanic membrane normal.     Nose: Nose normal.      Mouth/Throat:     Mouth: Mucous membranes are moist.     Pharynx: Oropharynx is clear.  Eyes:     Pupils: Pupils are equal, round, and reactive to light.  Cardiovascular:     Rate and Rhythm: Normal rate and regular rhythm.     Pulses: Normal pulses.  Pulmonary:     Effort: Pulmonary effort is normal.     Breath sounds: Normal breath sounds.  Abdominal:     General: Abdomen is flat. Bowel sounds are normal.     Palpations: Abdomen is soft.  Musculoskeletal:        General: Normal range of motion.     Cervical back: Normal range of motion and neck supple.  Skin:    General: Skin is warm and dry.  Neurological:     General: No focal deficit present.     Mental Status: He is alert and oriented to person, place, and time.  Psychiatric:        Mood and Affect: Mood normal.        Behavior: Behavior normal.        Thought Content: Thought content normal.        Judgment: Judgment normal.     Assessment & Plan:   Problem List Items Addressed This Visit      High   History of atrial fibrillation   Relevant Orders   Comp. Metabolic Panel (12) (Completed)   CBC with Differential/Platelet (Completed)   Polysubstance abuse (HCC)   Tobacco abuse    Other Visit Diagnoses    Health care maintenance    -  Primary   Relevant Orders   POCT glycosylated hemoglobin (Hb A1C) (Completed)   POCT urinalysis dipstick (Completed)   POCT glucose (manual entry) (Completed)      Outpatient Encounter Medications as of 08/03/2019  Medication Sig  . famotidine (PEPCID) 10 MG tablet Take 10 mg by mouth 2 (two) times daily as needed for heartburn or indigestion.  . methocarbamol (ROBAXIN) 500 MG tablet Take 2 tablets (1,000 mg total) by mouth 4 (four) times daily. (Patient not taking: Reported on 08/03/2019)  . naproxen (NAPROSYN) 500 MG tablet Take 1 tablet (500 mg total) by mouth 2 (two) times daily. (Patient not taking: Reported on 08/03/2019)   No facility-administered encounter medications on  file as of 08/03/2019.    Follow-up: Return in about 3 months (around 10/31/2019).   12/31/2019, NP

## 2019-11-01 ENCOUNTER — Other Ambulatory Visit: Payer: Self-pay

## 2019-11-01 ENCOUNTER — Ambulatory Visit (INDEPENDENT_AMBULATORY_CARE_PROVIDER_SITE_OTHER): Payer: Self-pay | Admitting: Nurse Practitioner

## 2019-11-01 ENCOUNTER — Encounter: Payer: Self-pay | Admitting: Nurse Practitioner

## 2019-11-01 VITALS — BP 113/62 | HR 90 | Ht 74.0 in | Wt 168.8 lb

## 2019-11-01 DIAGNOSIS — D649 Anemia, unspecified: Secondary | ICD-10-CM

## 2019-11-01 DIAGNOSIS — F17209 Nicotine dependence, unspecified, with unspecified nicotine-induced disorders: Secondary | ICD-10-CM

## 2019-11-01 DIAGNOSIS — Z0000 Encounter for general adult medical examination without abnormal findings: Secondary | ICD-10-CM

## 2019-11-01 DIAGNOSIS — Z8679 Personal history of other diseases of the circulatory system: Secondary | ICD-10-CM

## 2019-11-01 LAB — POCT URINALYSIS DIPSTICK
Bilirubin, UA: NEGATIVE
Blood, UA: NEGATIVE
Glucose, UA: NEGATIVE
Ketones, UA: NEGATIVE
Leukocytes, UA: NEGATIVE
Nitrite, UA: NEGATIVE
Protein, UA: NEGATIVE
Spec Grav, UA: 1.025 (ref 1.010–1.025)
Urobilinogen, UA: 0.2 E.U./dL
pH, UA: 5.5 (ref 5.0–8.0)

## 2019-11-01 NOTE — Progress Notes (Signed)
[facility] Patient [facility] [address] [place], [place]  [address] Phone:  [phone]   Fax:  [phone]  Established Patient Office Visit  Subjective:  Patient ID: Pedro Gomez, male    DOB: [birth year]  Age: 39 y.o. MRN: [id number]  CC:  Chief Complaint  Patient presents with  . Follow-up    3 month follow up    HPI Pedro Gomez presents for follow up. He  has a past medical history of Atrial fibrillation (HCC), Polysubstance abuse (HCC), and Tobacco abuse. In, Jan 2021 he was treated for cardiac arrest, Acute encephalopathy, Polysubstance abuse, Paroxysmal atrial fibrillation. He came into with questions of why people are looking at him funny and asking him if is feeling ok. He asked if it was easy to get depression. He denies being depressed. He denies headache, dizziness, visual changes, shortness of breath, dyspnea on exertion, chest pain, nausea, vomiting or any edema. He has lost 6 pounds but has not been trying to lose any. He admits that he had not noticed any weight loss. He continues to smoke but has been more thoughtful about needing to quit. He mentioned that he menthol cigarettes are going to be banded. He feels like this will be a way to quit.     Past Medical History:  Diagnosis Date  . Atrial fibrillation (HCC)   . Polysubstance abuse (HCC)   . Tobacco abuse     Past Surgical History:  Procedure Laterality Date  . TEE WITHOUT CARDIOVERSION N/A 12/13/2017   Procedure: TRANSESOPHAGEAL ECHOCARDIOGRAM (TEE);  Surgeon: Chrystie Nose, MD;  Location: [facility] ENDOSCOPY;  Service: Cardiovascular;  Laterality: N/A;    Family History  Problem Relation Age of Onset  . Hypertension Mother   . Hypertension Father     Social History   Socioeconomic History  . Marital status: Single    Spouse name: Not on file  . Number of children: Not on file  . Years of education: Not on file  . Highest education level: Not on file  Occupational History  . Not  on file  Tobacco Use  . Smoking status: Current Every Day Smoker    Types: Cigarettes  . Smokeless tobacco: Never Used  Substance and Sexual Activity  . Alcohol use: Yes  . Drug use: Yes    Types: Marijuana  . Sexual activity: Yes  Other Topics Concern  . Not on file  Social History Narrative   ** Merged History Encounter **       Social Determinants of Health   Financial Resource Strain:   . Difficulty of Paying Living Expenses:   Food Insecurity:   . Worried About Programme researcher, broadcasting/film/video in the Last Year:   . Barista in the Last Year:   Transportation Needs:   . Freight forwarder (Medical):   Marland Kitchen Lack of Transportation (Non-Medical):   Physical Activity:   . Days of Exercise per Week:   . Minutes of Exercise per Session:   Stress:   . Feeling of Stress :   Social Connections:   . Frequency of Communication with Friends and Family:   . Frequency of Social Gatherings with Friends and Family:   . Attends Religious Services:   . Active Member of Clubs or Organizations:   . Attends Banker Meetings:   Marland Kitchen Marital Status:   Intimate Partner Violence:   . Fear of Current or Ex-Partner:   . Emotionally Abused:   .  Physically Abused:   . Sexually Abused:     Outpatient Medications Prior to Visit  Medication Sig Dispense Refill  . famotidine (PEPCID) 10 MG tablet Take 10 mg by mouth 2 (two) times daily as needed for heartburn or indigestion.    . methocarbamol (ROBAXIN) 500 MG tablet Take 2 tablets (1,000 mg total) by mouth 4 (four) times daily. (Patient not taking: Reported on 08/03/2019) 20 tablet 0  . naproxen (NAPROSYN) 500 MG tablet Take 1 tablet (500 mg total) by mouth 2 (two) times daily. (Patient not taking: Reported on 08/03/2019) 20 tablet 0   No facility-administered medications prior to visit.    Allergies  Allergen Reactions  . Lactose Intolerance (Gi) Diarrhea    ROS Review of Systems  All other systems reviewed and are negative.       Objective:    Physical Exam  Constitutional: He is oriented to person, place, and time. He appears well-developed and well-nourished.  HENT:  Head: Normocephalic.  Eyes: Pupils are equal, round, and reactive to light.  Cardiovascular: Normal rate, regular rhythm, normal heart sounds and intact distal pulses.  Pulmonary/Chest: Effort normal and breath sounds normal.  Abdominal: Soft. Bowel sounds are normal.  Musculoskeletal:        General: Normal range of motion.     Cervical back: Normal range of motion.  Neurological: He is alert and oriented to person, place, and time.  Skin: Skin is warm and dry.  Psychiatric: He has a normal mood and affect. His behavior is normal. Judgment and thought content normal.    BP 113/62 (BP Location: Left Arm, Patient Position: Sitting)   Pulse 90   Ht 6\' 2"  (1.88 m)   Wt 168 lb 12.8 oz (76.6 kg)   SpO2 98%   BMI 21.67 kg/m  Wt Readings from Last 3 Encounters:  11/01/19 168 lb 12.8 oz (76.6 kg)  08/03/19 174 lb 6.4 oz (79.1 kg)  07/14/19 152 lb 12.5 oz (69.3 kg)     Health Maintenance Due  Topic Date Due  . TETANUS/TDAP  Never done    There are no preventive care reminders to display for this patient.  Lab Results  Component Value Date   TSH 1.281 07/01/2019   Lab Results  Component Value Date   WBC 4.3 11/01/2019   HGB 13.3 11/01/2019   HCT 39.6 11/01/2019   MCV 91 11/01/2019   PLT 295 11/01/2019   Lab Results  Component Value Date   NA 140 11/01/2019   K 4.3 11/01/2019   CO2 25 07/14/2019   GLUCOSE 90 11/01/2019   BUN 15 11/01/2019   CREATININE 1.06 11/01/2019   BILITOT 0.6 11/01/2019   ALKPHOS 65 11/01/2019   AST 20 11/01/2019   ALT 151 (H) 07/14/2019   PROT 6.5 11/01/2019   ALBUMIN 4.2 11/01/2019   CALCIUM 8.9 11/01/2019   ANIONGAP 10 07/14/2019   Lab Results  Component Value Date   CHOL 141 07/01/2019   Lab Results  Component Value Date   HDL 53 07/01/2019   Lab Results  Component Value Date    LDLCALC 81 07/01/2019   Lab Results  Component Value Date   TRIG 79 07/04/2019   Lab Results  Component Value Date   CHOLHDL 2.7 07/01/2019   Lab Results  Component Value Date   HGBA1C 4.9 08/03/2019      Assessment & Plan:   Problem List Items Addressed This Visit      High   History of  atrial fibrillation   Relevant Orders   EKG 12-Lead   Comp. Metabolic Panel (12) (Completed)    Other Visit Diagnoses    Tobacco use disorder, continuous    -  Primary   discusses cessation and the cardiac risk associated    Healthcare maintenance       CMP   Relevant Orders   POCT Urinalysis Dipstick (Completed)   Anemia, unspecified type       CBC pending    Relevant Orders   CBC with Differential/Platelet (Completed)      No orders of the defined types were placed in this encounter.   Follow-up: Return in about 3 months (around 02/01/2020) for early am apt for fasting labs thanks .    Vevelyn Francois, NP

## 2019-11-01 NOTE — Patient Instructions (Addendum)
Atrial Fibrillation  Atrial fibrillation is a type of heartbeat that is irregular or fast. If you have this condition, your heart beats without any order. This makes it hard for your heart to pump blood in a normal way. Atrial fibrillation may come and go, or it may become a long-lasting problem. If this condition is not treated, it can put you at higher risk for stroke, heart failure, and other heart problems. What are the causes? This condition may be caused by diseases that damage the heart. They include:  High blood pressure.  Heart failure.  Heart valve disease.  Heart surgery. Other causes include:  Diabetes.  Thyroid disease.  Being overweight.  Kidney disease. Sometimes the cause is not known. What increases the risk? You are more likely to develop this condition if:  You are older.  You smoke.  You exercise often and very hard.  You have a family history of this condition.  You are a man.  You use drugs.  You drink a lot of alcohol.  You have lung conditions, such as emphysema, pneumonia, or COPD.  You have sleep apnea. What are the signs or symptoms? Common symptoms of this condition include:  A feeling that your heart is beating very fast.  Chest pain or discomfort.  Feeling short of breath.  Suddenly feeling light-headed or weak.  Getting tired easily during activity.  Fainting.  Sweating. In some cases, there are no symptoms. How is this treated? Treatment for this condition depends on underlying conditions and how you feel when you have atrial fibrillation. They include:  Medicines to: ? Prevent blood clots. ? Treat heart rate or heart rhythm problems.  Using devices, such as a pacemaker, to correct heart rhythm problems.  Doing surgery to remove the part of the heart that sends bad signals.  Closing an area where clots can form in the heart (left atrial appendage). In some cases, your doctor will treat other underlying  conditions. Follow these instructions at home: Medicines  Take over-the-counter and prescription medicines only as told by your doctor.  Do not take any new medicines without first talking to your doctor.  If you are taking blood thinners: ? Talk with your doctor before you take any medicines that have aspirin or NSAIDs, such as ibuprofen, in them. ? Take your medicine exactly as told by your doctor. Take it at the same time each day. ? Avoid activities that could hurt or bruise you. Follow instructions about how to prevent falls. ? Wear a bracelet that says you are taking blood thinners. Or, carry a card that lists what medicines you take. Lifestyle      Do not use any products that have nicotine or tobacco in them. These include cigarettes, e-cigarettes, and chewing tobacco. If you need help quitting, ask your doctor.  Eat heart-healthy foods. Talk with your doctor about the right eating plan for you.  Exercise regularly as told by your doctor.  Do not drink alcohol.  Lose weight if you are overweight.  Do not use drugs, including cannabis. General instructions  If you have a condition that causes breathing to stop for a short period of time (apnea), treat it as told by your doctor.  Keep a healthy weight. Do not use diet pills unless your doctor says they are safe for you. Diet pills may make heart problems worse.  Keep all follow-up visits as told by your doctor. This is important. Contact a doctor if:  You notice a change   in the speed, rhythm, or strength of your heartbeat.  You are taking a blood-thinning medicine and you get more bruising.  You get tired more easily when you move or exercise.  You have a sudden change in weight. Get help right away if:   You have pain in your chest or your belly (abdomen).  You have trouble breathing.  You have side effects of blood thinners, such as blood in your vomit, poop (stool), or pee (urine), or bleeding that cannot  stop.  You have any signs of a stroke. "BE FAST" is an easy way to remember the main warning signs: ? B - Balance. Signs are dizziness, sudden trouble walking, or loss of balance. ? E - Eyes. Signs are trouble seeing or a change in how you see. ? F - Face. Signs are sudden weakness or loss of feeling in the face, or the face or eyelid drooping on one side. ? A - Arms. Signs are weakness or loss of feeling in an arm. This happens suddenly and usually on one side of the body. ? S - Speech. Signs are sudden trouble speaking, slurred speech, or trouble understanding what people say. ? T - Time. Time to call emergency services. Write down what time symptoms started.  You have other signs of a stroke, such as: ? A sudden, very bad headache with no known cause. ? Feeling like you may vomit (nausea). ? Vomiting. ? A seizure. These symptoms may be an emergency. Do not wait to see if the symptoms will go away. Get medical help right away. Call your local emergency services (911 in the [place] Do not drive yourself to the hospital. Summary  Atrial fibrillation is a type of heartbeat that is irregular or fast.  You are at higher risk of this condition if you smoke, are older, have diabetes, or are overweight.  Follow your doctor's instructions about medicines, diet, exercise, and follow-up visits.  Get help right away if you have signs or symptoms of a stroke.  Get help right away if you cannot catch your breath, or you have chest pain or discomfort. This information is not intended to replace advice given to you by your health care provider. Make sure you discuss any questions you have with your health care provider. Document Revised: 12/07/2018 Document Reviewed: 12/07/2018     Steps to Quit Smoking Smoking tobacco is the leading cause of preventable death. It can affect almost every organ in the body. Smoking puts you and people around you at risk for many serious, long-lasting (chronic)  diseases. Quitting smoking can be hard, but it is one of the best things that you can do for your health. It is never too late to quit. How do I get ready to quit? When you decide to quit smoking, make a plan to help you succeed. Before you quit:  Pick a date to quit. Set a date within the next 2 weeks to give you time to prepare.  Write down the reasons why you are quitting. Keep this list in places where you will see it often.  Tell your family, friends, and co-workers that you are quitting. Their support is important.  Talk with your doctor about the choices that may help you quit.  Find out if your health insurance will pay for these treatments.  Know the people, places, things, and activities that make you want to smoke (triggers). Avoid them. What first steps can I take to quit smoking?  Throw away  all cigarettes at home, at work, and in your car.  Throw away the things that you use when you smoke, such as ashtrays and lighters.  Clean your car. Make sure to empty the ashtray.  Clean your home, including curtains and carpets. What can I do to help me quit smoking? Talk with your doctor about taking medicines and seeing a counselor at the same time. You are more likely to succeed when you do both.  If you are pregnant or breastfeeding, talk with your doctor about counseling or other ways to quit smoking. Do not take medicine to help you quit smoking unless your doctor tells you to do so. To quit smoking: Quit right away  Quit smoking totally, instead of slowly cutting back on how much you smoke over a period of time.  Go to counseling. You are more likely to quit if you go to counseling sessions regularly. Take medicine You may take medicines to help you quit. Some medicines need a prescription, and some you can buy over-the-counter. Some medicines may contain a drug called nicotine to replace the nicotine in cigarettes. Medicines may:  Help you to stop having the desire to  smoke (cravings).  Help to stop the problems that come when you stop smoking (withdrawal symptoms). Your doctor may ask you to use:  Nicotine patches, gum, or lozenges.  Nicotine inhalers or sprays.  Non-nicotine medicine that is taken by mouth. Find resources Find resources and other ways to help you quit smoking and remain smoke-free after you quit. These resources are most helpful when you use them often. They include:  Online chats with a Veterinary surgeon.  Phone quitlines.  Printed Materials engineer.  Support groups or group counseling.  Text messaging programs.  Mobile phone apps. Use apps on your mobile phone or tablet that can help you stick to your quit plan. There are many free apps for mobile phones and tablets as well as websites. Examples include Quit Guide from the [employer] and smokefree.gov  What things can I do to make it easier to quit?   Talk to your family and friends. Ask them to support and encourage you.  Call a phone quitline (1-800-QUIT-NOW), reach out to support groups, or work with a Veterinary surgeon.  Ask people who smoke to not smoke around you.  Avoid places that make you want to smoke, such as: ? Bars. ? Parties. ? Smoke-break areas at work.  Spend time with people who do not smoke.  Lower the stress in your life. Stress can make you want to smoke. Try these things to help your stress: ? Getting regular exercise. ? Doing deep-breathing exercises. ? Doing yoga. ? Meditating. ? Doing a body scan. To do this, close your eyes, focus on one area of your body at a time from head to toe. Notice which parts of your body are tense. Try to relax the muscles in those areas. How will I feel when I quit smoking? Day 1 to 3 weeks Within the first 24 hours, you may start to have some problems that come from quitting tobacco. These problems are very bad 2-3 days after you quit, but they do not often last for more than 2-3 weeks. You may get these symptoms:  Mood  swings.  Feeling restless, nervous, angry, or annoyed.  Trouble concentrating.  Dizziness.  Strong desire for high-sugar foods and nicotine.  Weight gain.  Trouble pooping (constipation).  Feeling like you may vomit (nausea).  Coughing or a sore throat.  Changes in how the medicines that you take for other issues work in your body.  Depression.  Trouble sleeping (insomnia). Week 3 and afterward After the first 2-3 weeks of quitting, you may start to notice more positive results, such as:  Better sense of smell and taste.  Less coughing and sore throat.  Slower heart rate.  Lower blood pressure.  Clearer skin.  Better breathing.  Fewer sick days. Quitting smoking can be hard. Do not give up if you fail the first time. Some people need to try a few times before they succeed. Do your best to stick to your quit plan, and talk with your doctor if you have any questions or concerns. Summary  Smoking tobacco is the leading cause of preventable death. Quitting smoking can be hard, but it is one of the best things that you can do for your health.  When you decide to quit smoking, make a plan to help you succeed.  Quit smoking right away, not slowly over a period of time.  When you start quitting, seek help from your doctor, family, or friends. This information is not intended to replace advice given to you by your health care provider. Make sure you discuss any questions you have with your health care provider. Document Revised: 03/10/2019 Document Reviewed: 09/03/2018 Elsevier Patient Education  [employer] Patient Education  [employer].

## 2019-11-02 LAB — CBC WITH DIFFERENTIAL/PLATELET
Basophils Absolute: 0 10*3/uL (ref 0.0–0.2)
Basos: 1 %
EOS (ABSOLUTE): 0.4 10*3/uL (ref 0.0–0.4)
Eos: 9 %
Hematocrit: 39.6 % (ref 37.5–51.0)
Hemoglobin: 13.3 g/dL (ref 13.0–17.7)
Immature Grans (Abs): 0 10*3/uL (ref 0.0–0.1)
Immature Granulocytes: 0 %
Lymphocytes Absolute: 1.7 10*3/uL (ref 0.7–3.1)
Lymphs: 39 %
MCH: 30.4 pg (ref 26.6–33.0)
MCHC: 33.6 g/dL (ref 31.5–35.7)
MCV: 91 fL (ref 79–97)
Monocytes Absolute: 0.4 10*3/uL (ref 0.1–0.9)
Monocytes: 9 %
Neutrophils Absolute: 1.8 10*3/uL (ref 1.4–7.0)
Neutrophils: 42 %
Platelets: 295 10*3/uL (ref 150–450)
RBC: 4.37 x10E6/uL (ref 4.14–5.80)
RDW: 12.7 % (ref 11.6–15.4)
WBC: 4.3 10*3/uL (ref 3.4–10.8)

## 2019-11-02 LAB — COMP. METABOLIC PANEL (12)
AST: 20 IU/L (ref 0–40)
Albumin/Globulin Ratio: 1.8 (ref 1.2–2.2)
Albumin: 4.2 g/dL (ref 4.0–5.0)
Alkaline Phosphatase: 65 IU/L (ref 39–117)
BUN/Creatinine Ratio: 14 (ref 9–20)
BUN: 15 mg/dL (ref 6–20)
Bilirubin Total: 0.6 mg/dL (ref 0.0–1.2)
Calcium: 8.9 mg/dL (ref 8.7–10.2)
Chloride: 105 mmol/L (ref 96–106)
Creatinine, Ser: 1.06 mg/dL (ref 0.76–1.27)
GFR calc Af Amer: 102 mL/min/{1.73_m2} (ref >59)
GFR calc non Af Amer: 88 mL/min/{1.73_m2} (ref >59)
Globulin, Total: 2.3 g/dL (ref 1.5–4.5)
Glucose: 90 mg/dL (ref 65–99)
Potassium: 4.3 mmol/L (ref 3.5–5.2)
Sodium: 140 mmol/L (ref 134–144)
Total Protein: 6.5 g/dL (ref 6.0–8.5)

## 2020-02-01 ENCOUNTER — Other Ambulatory Visit: Payer: Self-pay

## 2021-12-26 DIAGNOSIS — M2392 Unspecified internal derangement of left knee: Secondary | ICD-10-CM | POA: Insufficient documentation

## 2022-10-14 ENCOUNTER — Emergency Department (HOSPITAL_COMMUNITY): Payer: Commercial Managed Care - HMO

## 2022-10-14 ENCOUNTER — Encounter (HOSPITAL_COMMUNITY): Payer: Self-pay

## 2022-10-14 ENCOUNTER — Other Ambulatory Visit: Payer: Self-pay

## 2022-10-14 ENCOUNTER — Observation Stay (HOSPITAL_COMMUNITY)
Admission: EM | Admit: 2022-10-14 | Discharge: 2022-10-17 | Disposition: A | Discharge status: Home / Self Care | Payer: Commercial Managed Care - HMO | Attending: Emergency Medicine | Admitting: Emergency Medicine

## 2022-10-14 DIAGNOSIS — I959 Hypotension, unspecified: Secondary | ICD-10-CM | POA: Diagnosis not present

## 2022-10-14 DIAGNOSIS — I480 Paroxysmal atrial fibrillation: Secondary | ICD-10-CM | POA: Diagnosis not present

## 2022-10-14 DIAGNOSIS — Z7901 Long term (current) use of anticoagulants: Secondary | ICD-10-CM | POA: Insufficient documentation

## 2022-10-14 DIAGNOSIS — Z79899 Other long term (current) drug therapy: Secondary | ICD-10-CM | POA: Insufficient documentation

## 2022-10-14 DIAGNOSIS — F1910 Other psychoactive substance abuse, uncomplicated: Secondary | ICD-10-CM | POA: Diagnosis present

## 2022-10-14 DIAGNOSIS — F17200 Nicotine dependence, unspecified, uncomplicated: Secondary | ICD-10-CM | POA: Diagnosis present

## 2022-10-14 DIAGNOSIS — R42 Dizziness and giddiness: Secondary | ICD-10-CM | POA: Diagnosis present

## 2022-10-14 DIAGNOSIS — F17210 Nicotine dependence, cigarettes, uncomplicated: Secondary | ICD-10-CM | POA: Diagnosis not present

## 2022-10-14 DIAGNOSIS — I4891 Unspecified atrial fibrillation: Secondary | ICD-10-CM

## 2022-10-14 LAB — BASIC METABOLIC PANEL
Anion gap: 7 (ref 5–15)
BUN: 19 mg/dL (ref 6–20)
CO2: 25 mmol/L (ref 22–32)
Calcium: 7.8 mg/dL — ABNORMAL LOW (ref 8.9–10.3)
Chloride: 107 mmol/L (ref 98–111)
Creatinine, Ser: 1.36 mg/dL — ABNORMAL HIGH (ref 0.61–1.24)
GFR, Estimated: >60 mL/min (ref >60)
Glucose, Bld: 143 mg/dL — ABNORMAL HIGH (ref 70–99)
Potassium: 4 mmol/L (ref 3.5–5.1)
Sodium: 139 mmol/L (ref 135–145)

## 2022-10-14 LAB — CBC
HCT: 41.9 % (ref 39.0–52.0)
Hemoglobin: 14 g/dL (ref 13.0–17.0)
MCH: 30.9 pg (ref 26.0–34.0)
MCHC: 33.4 g/dL (ref 30.0–36.0)
MCV: 92.5 fL (ref 80.0–100.0)
Platelets: 240 10*3/uL (ref 150–400)
RBC: 4.53 MIL/uL (ref 4.22–5.81)
RDW: 12 % (ref 11.5–15.5)
WBC: 3.8 10*3/uL — ABNORMAL LOW (ref 4.0–10.5)
nRBC: 0 % (ref 0.0–0.2)

## 2022-10-14 LAB — MAGNESIUM: Magnesium: 2.1 mg/dL (ref 1.7–2.4)

## 2022-10-14 MED ORDER — LACTATED RINGERS IV BOLUS
1000.0000 mL | Freq: Once | INTRAVENOUS | Status: AC
  Administered 2022-10-14: 1000 mL via INTRAVENOUS

## 2022-10-14 NOTE — ED Triage Notes (Signed)
Pt states he felt like his heart fluttering since yesterday. Pt states "I think I am having a heart attack". Denies pain.  States he is dizzy and sob.

## 2022-10-15 ENCOUNTER — Encounter (HOSPITAL_COMMUNITY): Payer: Self-pay | Admitting: Internal Medicine

## 2022-10-15 ENCOUNTER — Observation Stay (HOSPITAL_BASED_OUTPATIENT_CLINIC_OR_DEPARTMENT_OTHER): Payer: Commercial Managed Care - HMO

## 2022-10-15 DIAGNOSIS — I951 Orthostatic hypotension: Secondary | ICD-10-CM

## 2022-10-15 DIAGNOSIS — I4891 Unspecified atrial fibrillation: Secondary | ICD-10-CM | POA: Diagnosis not present

## 2022-10-15 DIAGNOSIS — I959 Hypotension, unspecified: Secondary | ICD-10-CM | POA: Diagnosis present

## 2022-10-15 LAB — CBC
HCT: 37.8 % — ABNORMAL LOW (ref 39.0–52.0)
Hemoglobin: 12.6 g/dL — ABNORMAL LOW (ref 13.0–17.0)
MCH: 30.7 pg (ref 26.0–34.0)
MCHC: 33.3 g/dL (ref 30.0–36.0)
MCV: 92 fL (ref 80.0–100.0)
Platelets: 207 10*3/uL (ref 150–400)
RBC: 4.11 MIL/uL — ABNORMAL LOW (ref 4.22–5.81)
RDW: 12.1 % (ref 11.5–15.5)
WBC: 5.2 10*3/uL (ref 4.0–10.5)
nRBC: 0 % (ref 0.0–0.2)

## 2022-10-15 LAB — ECHOCARDIOGRAM COMPLETE
Area-P 1/2: 2.73 cm2
Calc EF: 59.4 %
Height: 74 in
S' Lateral: 3.2 cm
Single Plane A2C EF: 59.1 %
Single Plane A4C EF: 58.9 %
Weight: 2576 oz

## 2022-10-15 LAB — RAPID URINE DRUG SCREEN, HOSP PERFORMED
Amphetamines: NOT DETECTED
Barbiturates: NOT DETECTED
Benzodiazepines: NOT DETECTED
Cocaine: POSITIVE — AB
Opiates: NOT DETECTED
Tetrahydrocannabinol: POSITIVE — AB

## 2022-10-15 LAB — TSH: TSH: 1.274 u[IU]/mL (ref 0.350–4.500)

## 2022-10-15 LAB — COMPREHENSIVE METABOLIC PANEL
ALT: 9 U/L (ref 0–44)
AST: 11 U/L — ABNORMAL LOW (ref 15–41)
Albumin: 2.7 g/dL — ABNORMAL LOW (ref 3.5–5.0)
Alkaline Phosphatase: 39 U/L (ref 38–126)
Anion gap: 6 (ref 5–15)
BUN: 18 mg/dL (ref 6–20)
CO2: 24 mmol/L (ref 22–32)
Calcium: 8.1 mg/dL — ABNORMAL LOW (ref 8.9–10.3)
Chloride: 108 mmol/L (ref 98–111)
Creatinine, Ser: 0.96 mg/dL (ref 0.61–1.24)
GFR, Estimated: >60 mL/min (ref >60)
Glucose, Bld: 93 mg/dL (ref 70–99)
Potassium: 4.2 mmol/L (ref 3.5–5.1)
Sodium: 138 mmol/L (ref 135–145)
Total Bilirubin: 0.6 mg/dL (ref 0.3–1.2)
Total Protein: 4.9 g/dL — ABNORMAL LOW (ref 6.5–8.1)

## 2022-10-15 MED ORDER — LACTATED RINGERS IV BOLUS
1000.0000 mL | Freq: Once | INTRAVENOUS | Status: AC
  Administered 2022-10-15: 1000 mL via INTRAVENOUS

## 2022-10-15 MED ORDER — ACETAMINOPHEN 650 MG RE SUPP: 650.0000 mg | Freq: Four times a day (QID) | RECTAL | Status: DC | PRN

## 2022-10-15 MED ORDER — SODIUM CHLORIDE 0.9 % IV SOLN: INTRAVENOUS | Status: DC

## 2022-10-15 MED ORDER — APIXABAN 5 MG PO TABS
5.0000 mg | ORAL_TABLET | Freq: Two times a day (BID) | ORAL | Status: DC
  Administered 2022-10-15 (×2): 5 mg via ORAL
  Filled 2022-10-15 (×2): qty 1

## 2022-10-15 MED ORDER — NICOTINE 14 MG/24HR TD PT24: 14.0000 mg | MEDICATED_PATCH | Freq: Every day | TRANSDERMAL | Status: DC | PRN

## 2022-10-15 MED ORDER — ACETAMINOPHEN 325 MG PO TABS: 650.0000 mg | ORAL_TABLET | Freq: Four times a day (QID) | ORAL | Status: DC | PRN

## 2022-10-15 MED ORDER — APIXABAN 5 MG PO TABS: 5.0000 mg | ORAL_TABLET | Freq: Two times a day (BID) | ORAL | 0 refills | Status: DC

## 2022-10-15 MED ORDER — PROCHLORPERAZINE EDISYLATE 10 MG/2ML IJ SOLN: 10.0000 mg | Freq: Four times a day (QID) | INTRAMUSCULAR | Status: DC | PRN

## 2022-10-15 NOTE — ED Provider Notes (Signed)
Charge from the night team however nurse came in notified me that patient's blood pressure was in the 80s.  When going in speaking with the patient he still is in atrial fibrillation heart rates are less than 100 but blood pressure is consistently in the 80s.  MAP still of 68.  With ambulating patient feels okay but was standing blood pressure is in the 70s.  Feel this is most likely related to the A-fib however he is not a candidate for cardioversion as he is not on any anticoagulation.  Spoke with Carolanne Grumbling with cardiology she recommends admission, cardiology will consult on the patient and he will most likely need cardioversion.  Did discuss with the patient and he is comfortable with this plan.  Hospitalist consulted for admission.   Gwyneth Sprout, MD 10/15/22 1007

## 2022-10-15 NOTE — ED Notes (Signed)
Dr. Anitra Lauth made aware of pt hypotension. BP taken in both arms with approx the same reading. Pt denies any chest pain, shob, or dizziness at this time. Pt ambulatory to the restroom without difficulty. Gait steady and even.

## 2022-10-15 NOTE — ED Notes (Signed)
Dr. Anitra Lauth at bedside to assess pt.

## 2022-10-15 NOTE — ED Provider Notes (Signed)
[place] EMERGENCY DEPARTMENT AT [facility] Provider Note   CSN: [id number] Arrival date & time: 10/14/22  2221     History  Chief Complaint  Patient presents with   Irregular Heart Beat    Pedro Gomez is a 42 y.o. male.  HPI Patient is a 42 year old male with past medical history significant for paroxysmal A-fib, polysubstance use, history of cardiac arrest, PFO  Patient presents emergency room today with complaints of heart palpitations fatigue and some lightheadedness.  He states he sometimes feels somewhat short of breath over the past 2 days as well.  He denies any fevers or chills no syncope or near syncope.  He states that he most recently did cocaine 3 days ago.  He denies any episodes of chest pain.  He has a history of A-fib in the past and did have cardioversion in the past.  No recent surgeries, hospitalization, long travel, hemoptysis, estrogen containing OCP, cancer history.  No unilateral leg swelling.  No history of PE or VTE.      Home Medications Prior to Admission medications   Medication Sig Start Date End Date Taking? Authorizing Provider  apixaban (ELIQUIS) 5 MG TABS tablet Take 1 tablet (5 mg total) by mouth 2 (two) times daily. 10/15/22 11/14/22 Yes Mourad Cwikla S, PA  famotidine (PEPCID) 10 MG tablet Take 10 mg by mouth 2 (two) times daily as needed for heartburn or indigestion.    [provider]  methocarbamol (ROBAXIN) 500 MG tablet Take 2 tablets (1,000 mg total) by mouth 4 (four) times daily. Patient not taking: Reported on 08/03/2019 06/27/18   Renne Crigler, PA-C      Allergies    Lactose intolerance (gi)    Review of Systems   Review of Systems  Physical Exam Updated Vital Signs BP 97/68   Pulse 93   Temp 97.8 F (36.6 C) (Oral)   Resp 13   Ht  (1.88 m)   Wt 73 kg   SpO2 99%   BMI 20.67 kg/m  Physical Exam Vitals and nursing note reviewed.  Constitutional:      General: He is not in acute  distress. HENT:     Head: Normocephalic and atraumatic.     Nose: Nose normal.     Mouth/Throat:     Mouth: Mucous membranes are dry.  Eyes:     General: No scleral icterus. Cardiovascular:     Rate and Rhythm: Regular rhythm. Tachycardia present.     Pulses: Normal pulses.     Heart sounds: Normal heart sounds.  Pulmonary:     Effort: Pulmonary effort is normal. No respiratory distress.     Breath sounds: No wheezing.     Comments: Lungs clear to auscultation Abdominal:     Palpations: Abdomen is soft.     Tenderness: There is no abdominal tenderness. There is no guarding or rebound.  Musculoskeletal:     Cervical back: Normal range of motion.     Right lower leg: No edema.     Left lower leg: No edema.  Skin:    General: Skin is warm and dry.     Capillary Refill: Capillary refill takes less than 2 seconds.  Neurological:     Mental Status: He is alert. Mental status is at baseline.  Psychiatric:        Mood and Affect: Mood normal.        Behavior: Behavior normal.     ED Results / Procedures /  Treatments   Labs (all labs ordered are listed, but only abnormal results are displayed) Labs Reviewed  CBC - Abnormal; Notable for the following components:      Result Value   WBC 3.8 (*)    All other components within normal limits  BASIC METABOLIC PANEL - Abnormal; Notable for the following components:   Glucose, Bld 143 (*)    Creatinine, Ser 1.36 (*)    Calcium 7.8 (*)    All other components within normal limits  TSH  MAGNESIUM  RAPID URINE DRUG SCREEN, HOSP PERFORMED    EKG EKG Interpretation  Date/Time:  Wednesday October 14 2022 22:31:55 EDT Ventricular Rate:  145 PR Interval:    QRS Duration: 73 QT Interval:  276 QTC Calculation: 429 R Axis:   77 Text Interpretation: Atrial fibrillation Left ventricular hypertrophy afib rvr st elevation noted on prior Confirmed by Tanda Rockers ([id number]) on 10/14/2022 10:37:56 PM  Radiology DG Chest Portable 1  View  Result Date: 10/14/2022 CLINICAL DATA:  Shortness of breath. Atrial fibrillation. EXAM: PORTABLE CHEST 1 VIEW COMPARISON:  Radiograph 07/10/2019 FINDINGS: The heart is normal in size.The cardiomediastinal contours are normal. The lungs are clear. Pulmonary vasculature is normal. No consolidation, pleural effusion, or pneumothorax. No acute osseous abnormalities are seen. IMPRESSION: No acute chest findings. Electronically Signed   By: Narda Rutherford M.D.   On: 10/14/2022 23:20    Procedures Procedures    Medications Ordered in ED Medications  apixaban (ELIQUIS) tablet 5 mg (has no administration in time range)  lactated ringers bolus 1,000 mL (0 mLs Intravenous Stopped 10/15/22 0107)  lactated ringers bolus 1,000 mL (0 mLs Intravenous Stopped 10/15/22 0230)  lactated ringers bolus 1,000 mL (0 mLs Intravenous Stopped 10/15/22 9604)    ED Course/ Medical Decision Making/ A&P Clinical Course as of 10/15/22 5409  Wed Oct 14, 2022  2247 Yesterday felt fine but today he woke up feeling dizzy and fatigued and SOB and had palpitations. Some similar sx for the past week.  Cocaine most recently - 3 days ago [WF]  Thu Oct 15, 2022  0038 Rechecked -- pt feels improved but BP is still low (map >65 but SBP 85) [WF]    Clinical Course User Index [WF] Gailen Shelter, [place]                             Medical Decision Making Amount and/or Complexity of Data Reviewed Labs: ordered. Radiology: ordered.  Risk Prescription drug management.   This patient presents to the ED for concern of fatigue, this involves a number of treatment options, and is a complaint that carries with it a moderate/high risk of complications and morbidity. A differential diagnosis was considered for the patient's symptoms which is discussed below:   The differential diagnosis of weakness includes but is not limited to neurologic causes (GBS, myasthenia gravis, CVA, MS, ALS, transverse myelitis, spinal cord injury,  CVA, botulism, ) and other causes: ACS, Arrhythmia, syncope, orthostatic hypotension, sepsis, hypoglycemia, electrolyte disturbance, hypothyroidism, respiratory failure, symptomatic anemia, dehydration, heat injury, polypharmacy, malignancy.    Co morbidities: Discussed in HPI   Brief History:  Patient is a 42 year old male with past medical history significant for paroxysmal A-fib, polysubstance use, history of cardiac arrest, PFO  Patient presents emergency room today with complaints of heart palpitations fatigue and some lightheadedness.  He states he sometimes feels somewhat short of breath over the past 2 days as well.  He denies any fevers or chills no syncope or near syncope.  He states that he most recently did cocaine 3 days ago.  He denies any episodes of chest pain.  He has a history of A-fib in the past and did have cardioversion in the past.  No recent surgeries, hospitalization, long travel, hemoptysis, estrogen containing OCP, cancer history.  No unilateral leg swelling.  No history of PE or VTE.     EMR reviewed including pt PMHx, past surgical history and past visits to ER.   See HPI for more details   Lab Tests:   I ordered and independently interpreted labs. Labs notable for TSH magnesium normal, BMP with marginal elevation in creatinine from his baseline 2 years ago.  He appears clinically quite dehydrated will rehydrate.  CBC with mild leukopenia no leukocytosis or anemia.  Imaging Studies:  NAD. I personally reviewed all imaging studies and no acute abnormality found. I agree with radiology interpretation.  Cardiac silhouette normal size.  Cardiac Monitoring:  The patient was maintained on a cardiac monitor.  I personally viewed and interpreted the cardiac monitored which showed an underlying rhythm of: A-fib EKG non-ischemic atrial fibrillation initial heart rate 145 heart rate is trended down to consistently less than 100 typically between 80 and 90  bpm   Medicines ordered:  I ordered medication including LR 2L for hydration.  Reevaluation of the patient after these medicines showed that the patient improved I have reviewed the patients home medicines and have made adjustments as needed   Critical Interventions:     Consults/Attending Physician   I discussed this case with my attending physician who cosigned this note including patient's presenting symptoms, physical exam, and planned diagnostics and interventions. Attending physician stated agreement with plan or made changes to plan which were implemented.  Will give additional 1 L bolus of lactated Ringer's  Reevaluation:  After the interventions noted above I re-evaluated patient and found that they have :resolved   Social Determinants of Health:  The patient's social determinants of health were a factor in the care of this patient Cocaine use   Problem List / ED Course:  Atrial fibrillation -feels much improved here.  No longer lightheaded.  Still in A-fib but rate controlled with rates consistently less than 100 and typically 90 or less.  No chest pain or difficulty breathing.  Ambulate without difficulty.  Negative orthostatic vital signs with blood pressure actually somewhat increasing with standing.  Not symptomatic.  Will provide patient with Eliquis, ambulatory referral to A-fib clinic and return precautions.   Dispostion:  After consideration of the diagnostic results and the patients response to treatment, I feel that the patent would benefit from follow up in the afib clinic. Eliquis at home.    Final Clinical Impression(s) / ED Diagnoses Final diagnoses:  Atrial fibrillation, unspecified type    Rx / DC Orders ED Discharge Orders          Ordered    Amb Referral to AFIB Clinic        10/15/22 0545    apixaban (ELIQUIS) 5 MG TABS tablet  2 times daily        10/15/22 0622              Solon Augusta [place], [place] 10/15/22 1610    Dione Booze, MD 10/15/22 [phone]

## 2022-10-15 NOTE — Plan of Care (Signed)
  Problem: Activity: Goal: Ability to tolerate increased activity will improve Outcome: Progressing   Problem: Cardiac: Goal: Ability to achieve and maintain adequate cardiopulmonary perfusion will improve Outcome: Progressing   Problem: Education: Goal: Knowledge of General Education information will improve Description: Including pain rating scale, medication(s)/side effects and non-pharmacologic comfort measures Outcome: Progressing   Problem: Activity: Goal: Risk for activity intolerance will decrease Outcome: Progressing

## 2022-10-15 NOTE — Consult Note (Signed)
Cardiology Consultation   Patient ID: Pedro Gomez MRN: [id number]; DOB: [birth year]  Admit date: 10/14/2022 Date of Consult: 10/15/2022  PCP:  Patient, No Pcp Per   [facility] Providers Cardiologist:  Used to see Dr Eldridge Dace 2019, new to Dr Mayford Knife   Patient Profile:   Pedro Gomez is a 42 y.o. male with a hx of paroxysmal A fib, small PFO, polysubstance abuse with cocaine and marijuana, tobacco abuse,  cardiac arrest 06/2019, who is being seen 10/15/2022 for the evaluation of A fib at the request of Dr Anitra Lauth.  History of Present Illness:   Mr. Luu with above PMH presented to ER 10/14/22 for heart palpitation. He felt his heart was having intermittent fluttering 3-4 days ago. He endorses associated SOB, fatigue, dizziness over the past 3-4 days. He denied any chest pain, rapid weight gain. He denied any SOB with exertion prior to 3-4 days ago, was able to engage in exercise if he wants.  He states symptoms are similar to his previous A fib in 2019.  He admits snorting cocaine 3 days ago, actively using marijuana, smokes few cigarettes daily. He takes a acid reflux pill. He denied hx of MI, CVA, DM, CKD. He states his symptoms are resolve now.   Admission diagnostic showed Cr 1.36 and GFR >60. WBC 3800. TSH WNL. CXR no acute finding. EKG showed A fib RVR 145bpm, non-specific ST T abnormalities. He had spontaneous recovery of heart rate to 80-90s  at ED. His BP was persistent low from 77/60-110/73.  He was given 3L LR bolus at ED. Cardiology is consulted today for A fib.   Per chart review, he was seen in A fib clinic 10/04/2017, reportedly had new onset of  A fib RVR and was hospitalized at [facility] prior to that visit, he was SOB with A fib, underwent successful cardioversion. He was recommend smoking and marijuana cessation. He was advised taking Xarelto 20mg  daily for 1 month after DCCV and stopped due to chadsvasc score of 0. Echo from 10/05/17 showed LVEF 55-60%, normal WM,  normal diastolic parameter, RV volume overload, mod RV dilation, left to right shunt was seen, suspected ASD. He followed up with Dr Eldridge Dace on 12/07/17, recommend TEE which was done 12/13/17 showed LVEF 55-60%, no valvular disease, mild RAE, small PFO, no ASD. This was reviewed with Dr Excell Seltzer, felt PFO is very small without significant shunting and requires no intervention. He was taken off anticoagulation for A fib. He was lost to follow up after that.   He was hospitalized 06/2019 due to acute respiratory failure with hypoxia secondary to cardiac arrest/aspiration pneumonia, reportedly using cocaine. Echo was done 07/02/19 showed LVEF 50-55%, no RWMA, trivial pericardial effusion, normal RV, no significant valvular disease.     Past Medical History:  Diagnosis Date   Atrial fibrillation    Polysubstance abuse    Tobacco abuse     Past Surgical History:  Procedure Laterality Date   TEE WITHOUT CARDIOVERSION N/A 12/13/2017   Procedure: TRANSESOPHAGEAL ECHOCARDIOGRAM (TEE);  Surgeon: Chrystie Nose, MD;  Location: [facility] ENDOSCOPY;  Service: Cardiovascular;  Laterality: N/A;     Home Medications:  Prior to Admission medications   Medication Sig Start Date End Date Taking? Authorizing Provider  apixaban (ELIQUIS) 5 MG TABS tablet Take 1 tablet (5 mg total) by mouth 2 (two) times daily. 10/15/22 11/14/22 Yes Fondaw, Wylder S, PA  famotidine (PEPCID) 10 MG tablet Take 10 mg by mouth 2 (two) times daily as  needed for heartburn or indigestion.    [provider]  methocarbamol (ROBAXIN) 500 MG tablet Take 2 tablets (1,000 mg total) by mouth 4 (four) times daily. Patient not taking: Reported on 08/03/2019 06/27/18   Renne Crigler, PA-C    Inpatient Medications: Scheduled Meds:  apixaban  5 mg Oral BID   Continuous Infusions:  sodium chloride 125 mL/hr at 10/15/22 1049   PRN Meds:   Allergies:    Allergies  Allergen Reactions   Lactose Intolerance (Gi) Diarrhea    Social History:    Social History   Socioeconomic History   Marital status: Single    Spouse name: Not on file   Number of children: Not on file   Years of education: Not on file   Highest education level: Not on file  Occupational History   Not on file  Tobacco Use   Smoking status: Every Day    Types: Cigarettes   Smokeless tobacco: Never  Vaping Use   Vaping Use: Never used  Substance and Sexual Activity   Alcohol use: Yes   Drug use: Yes    Types: Marijuana   Sexual activity: Yes  Other Topics Concern   Not on file  Social History Narrative   ** Merged History Encounter **       Social Determinants of Health   Financial Resource Strain: Not on file  Food Insecurity: Not on file  Transportation Needs: Not on file  Physical Activity: Not on file  Stress: Not on file  Social Connections: Not on file  Intimate Partner Violence: Not on file    Family History:    Family History  Problem Relation Age of Onset   Hypertension Mother    Hypertension Father      ROS:  Constitutional: Denied fever, chills, malaise, night sweats Eyes: Denied vision change or loss Ears/Nose/Mouth/Throat: Denied ear ache, sore throat, coughing, sinus pain Cardiovascular: see HPI  Respiratory: see HPI  Gastrointestinal: Denied nausea, vomiting, abdominal pain, diarrhea Genital/Urinary: Denied dysuria, hematuria, urinary frequency/urgency Musculoskeletal: Denied muscle ache, joint pain, weakness Skin: Denied rash, wound Neuro: Denied headache, dizziness, syncope Psych: Denied history of depression/anxiety  Endocrine: Denied history of diabetes   Physical Exam/Data:   Vitals:   10/15/22 1025 10/15/22 1030 10/15/22 1045 10/15/22 1100  BP: (!) 99/59 97/66 (!) 87/67 94/60  Pulse: 81     Resp: 11 12 13 12   Temp:      TempSrc:      SpO2: 99%     Weight:      Height:        Intake/Output Summary (Last 24 hours) at 10/15/2022 1112 Last data filed at 10/15/2022 0607 Gross per 24 hour  Intake 3000  ml  Output --  Net 3000 ml      10/14/2022   10:29 PM 11/01/2019    1:55 PM 08/03/2019    1:26 PM  Last 3 Weights  Weight (lbs) 161 lb 168 lb 12.8 oz 174 lb 6.4 oz  Weight (kg) 73.029 kg 76.567 kg 79.107 kg     Body mass index is 20.67 kg/m.   Vitals:  Vitals:   10/15/22 1045 10/15/22 1100  BP: (!) 87/67 94/60  Pulse:    Resp: 13 12  Temp:    SpO2:     General Appearance: In no apparent distress, sitting in bed HEENT: Normocephalic, atraumatic.  Neck: Supple, trachea midline, no JVD Cardiovascular: Irregularly irregular, normal S1-S2,  no murmur Respiratory: Resting breathing unlabored, lungs sounds  clear to auscultation bilaterally, no use of accessory muscles. On room air.  No wheezes, rales or rhonchi.   Gastrointestinal: Bowel sounds positive, abdomen soft, non-tender Extremities: Able to move all extremities in bed without difficulty, no edema of BLE  Musculoskeletal: Normal muscle bulk and tone Skin: Intact, warm, dry. No rashes or petechiae noted in exposed areas.  Neurologic: Alert, oriented to person, place and time. Fluent speech,no cognitive deficit Psychiatric: Normal affect. Mood is appropriate.     EKG:  The EKG was personally reviewed and demonstrates:     EKG showed A fib RVR 145bpm, non-specific ST T abnormalities.   Telemetry:  Telemetry was personally reviewed and demonstrates:    A fib with ventricular rate of 70-90s currently, longest pause 2.21 seconds, noted RVR up to 140s initially   Relevant CV Studies:  Echo from 07/02/19:    1. Left ventricular ejection fraction, by visual estimation, is 50 to  55%. The left ventricle has low normal function. There is mildly increased  left ventricular hypertrophy.   2. The left ventricle has no regional wall motion abnormalities.   3. Left atrial size was normal.   4. Right atrial size was normal.   5. Trivial pericardial effusion is present.   6. The pericardial effusion is posterior to the left  ventricle.   7. The mitral valve is grossly normal. No evidence of mitral valve  regurgitation.   8. The tricuspid valve is grossly normal.   9. The aortic valve is tricuspid. Aortic valve regurgitation is not  visualized.  10. The pulmonic valve was grossly normal. Pulmonic valve regurgitation is  not visualized.  11. Global right ventricle has normal systolic function.The right  ventricular size is normal. No increase in right ventricular wall  thickness.  12. TR signal is inadequate for assessing pulmonary artery systolic  pressure.  13. Intermittently seen bubbles in right heart, presumably from intavenous  infusion.   Laboratory Data:  High Sensitivity Troponin:  No results for input(s): "TROPONINIHS" in the last 720 hours.   Chemistry Recent Labs  Lab 10/14/22 2340  NA 139  K 4.0  CL 107  CO2 25  GLUCOSE 143*  BUN 19  CREATININE 1.36*  CALCIUM 7.8*  MG 2.1  GFRNONAA >60  ANIONGAP 7    No results for input(s): "PROT", "ALBUMIN", "AST", "ALT", "ALKPHOS", "BILITOT" in the last 168 hours. Lipids No results for input(s): "CHOL", "TRIG", "HDL", "LABVLDL", "LDLCALC", "CHOLHDL" in the last 168 hours.  Hematology Recent Labs  Lab 10/14/22 2340 10/15/22 1045  WBC 3.8* 5.2  RBC 4.53 4.11*  HGB 14.0 12.6*  HCT 41.9 37.8*  MCV 92.5 92.0  MCH 30.9 30.7  MCHC 33.4 33.3  RDW 12.0 12.1  PLT 240 207   Thyroid  Recent Labs  Lab 10/14/22 2340  TSH 1.274    BNPNo results for input(s): "BNP", "PROBNP" in the last 168 hours.  DDimer No results for input(s): "DDIMER" in the last 168 hours.   Radiology/Studies:  DG Chest Portable 1 View  Result Date: 10/14/2022 CLINICAL DATA:  Shortness of breath. Atrial fibrillation. EXAM: PORTABLE CHEST 1 VIEW COMPARISON:  Radiograph 07/10/2019 FINDINGS: The heart is normal in size.The cardiomediastinal contours are normal. The lungs are clear. Pulmonary vasculature is normal. No consolidation, pleural effusion, or pneumothorax. No  acute osseous abnormalities are seen. IMPRESSION: No acute chest findings. Electronically Signed   By: Narda Rutherford M.D.   On: 10/14/2022 23:20     Assessment and Plan:  Paroxysmal A fib with RVR - initial onset was in 2019, underwent successful cardioversion, not long on medical therapy or anticoagulation since 2019 due to no recurrence and chadsvasc score of 0 - presented with 3-4 days onset of SOB, heart palpitation, dizziness - TSH WNL; Cr 1.36 no recent labs to compare baseline/suspect AKI/dehydration; Echo pending  - EKG and telemetry consistent with A fib, RVR has resolved/maybe due to IVF administration, currently ventricular tate of 70-90s - agree with initiation of Eliquis - doubt hypotension is due to controlled A fib, MAP is over 65 and he is asymptomatic, consider further workup for hypotension/defer to primary team, will plan for TEE/DCCV for now - consider return to A fib clinic for follow up as well as ablation at time of discharge  - discussed the need of cessation of cocaine, tobacco, marijuana   Hypotension  Presumed AKI - BP borderline low, MAP >65, no longer having symptoms from A fib as rate is controlled - suspect dehydration/substance abuse related, not due to A fib, consider further workup      Risk Assessment/Risk Scores:    CHA2DS2-VASc Score = 0  This indicates a 0.2% annual risk of stroke. The patient's score is based upon: CHF History: 0 HTN History: 0 Diabetes History: 0 Stroke History: 0 Vascular Disease History: 0 Age Score: 0 Gender Score: 0       For questions or updates, please contact [facility] Please consult [email or web address] for contact info under    Signed, Cyndi Bender, NP  10/15/2022 11:12 AM

## 2022-10-15 NOTE — ED Notes (Signed)
Pt given a sandwich and a drink

## 2022-10-15 NOTE — Progress Notes (Signed)
Echocardiogram 2D Echocardiogram has been performed.  Toni Amend 10/15/2022, 12:55 PM

## 2022-10-15 NOTE — H&P (Signed)
History and Physical    Patient: Pedro Gomez ZOX:[id number] DOB: 30-Oct-1980 DOA: 10/14/2022 DOS: the patient was seen and examined on 10/15/2022 PCP: Patient, No Pcp Per  Patient coming from: Home  Chief Complaint:  Chief Complaint  Patient presents with   Irregular Heart Beat   HPI: Pedro Gomez is a 42 y.o. male with medical history significant of AKI, paroxysmal atrial fibrillation, polysubstance abuse, cocaine abuse, tobacco abuse, PFO, history of cardiac arrest who presented to the emergency department with complaints of dizziness and irregular heartbeat.  He has been having palpitations since the weekend.  He used cocaine about a week ago.  He drinks caffeinated carbonated drinks daily and he smokes cigarettes.  Yesterday, he hide a delta 8 THC gummy and did not feel well after this.  He became lightheaded, mildly sweaty and fell very thirsty.  No nausea or chest pain.  No PND, orthopnea or pitting edema of the lower extremities. He denied fever, chills, rhinorrhea, sore throat, wheezing or hemoptysis.  No abdominal pain, nausea, emesis, diarrhea, constipation, melena or hematochezia.  No flank pain, dysuria, frequency or hematuria.  No polyuria, polydipsia, polyphagia or blurred vision.   ED course: Initial vital signs were temperature 97.4 F, pulse 145, respiration 18, BP 110/73 mmHg O2 sat 100% on room air.  The patient received a total of 3000 mL of LR bolus and was started on Eliquis 5 mg p.o. twice daily.  Lab work: CBC showed a white count of 3.8, hemoglobin 14.0 g/dL platelets 811.  TSH and magnesium were normal.  BMP showed a glucose of 143, BUN 19, creatinine 1.36 and calcium 7.9 mg/dL.  The rest of the electrolytes were normal.  Imaging: Portable 1 view chest radiograph with no acute chest findings.   Review of Systems: As mentioned in the history of present illness. All other systems reviewed and are negative. Past Medical History:  Diagnosis Date   Atrial  fibrillation    Polysubstance abuse    Tobacco abuse    Past Surgical History:  Procedure Laterality Date   TEE WITHOUT CARDIOVERSION N/A 12/13/2017   Procedure: TRANSESOPHAGEAL ECHOCARDIOGRAM (TEE);  Surgeon: Chrystie Nose, MD;  Location: [facility] ENDOSCOPY;  Service: Cardiovascular;  Laterality: N/A;   Social History:  reports that he has been smoking cigarettes. He has never used smokeless tobacco. He reports current alcohol use. He reports current drug use. Drug: Marijuana.  Allergies  Allergen Reactions   Lactose Intolerance (Gi) Diarrhea    Family History  Problem Relation Age of Onset   Hypertension Mother    Hypertension Father     Prior to Admission medications   Medication Sig Start Date End Date Taking? Authorizing Provider  apixaban (ELIQUIS) 5 MG TABS tablet Take 1 tablet (5 mg total) by mouth 2 (two) times daily. 10/15/22 11/14/22 Yes Fondaw, Wylder S, PA  famotidine (PEPCID) 10 MG tablet Take 10 mg by mouth 2 (two) times daily as needed for heartburn or indigestion.    [provider]  methocarbamol (ROBAXIN) 500 MG tablet Take 2 tablets (1,000 mg total) by mouth 4 (four) times daily. Patient not taking: Reported on 08/03/2019 06/27/18   Renne Crigler, PA-C    Physical Exam: Vitals:   10/15/22 0930 10/15/22 0945 10/15/22 1000 10/15/22 1023  BP: (!) 89/62 (!) 80/67 91/71   Pulse:      Resp: Temp:    (!) 97.1 F (36.2 C)  TempSrc:    Oral  SpO2:  Weight:      Height:       Physical Exam Vitals reviewed.  Constitutional:      General: He is awake. He is not in acute distress. HENT:     Head: Normocephalic.     Nose: No rhinorrhea.     Mouth/Throat:     Mouth: Mucous membranes are moist.  Eyes:     General: No scleral icterus.    Pupils: Pupils are equal, round, and reactive to light.  Neck:     Vascular: No JVD.  Cardiovascular:     Rate and Rhythm: Normal rate. Rhythm irregular.     Heart sounds: S1 normal and S2 normal.   Pulmonary:     Effort: Pulmonary effort is normal.     Breath sounds: Normal breath sounds.  Abdominal:     General: Bowel sounds are normal. There is no distension.     Palpations: Abdomen is soft.  Musculoskeletal:     Cervical back: Neck supple.     Right lower leg: No edema.     Left lower leg: No edema.  Skin:    General: Skin is warm and dry.  Neurological:     General: No focal deficit present.     Mental Status: He is alert and oriented to person, place, and time.  Psychiatric:        Mood and Affect: Mood normal.        Behavior: Behavior normal. Behavior is cooperative.   Data Reviewed:  Results are pending, will review when available.  07/02/2019 echocardiogram IMPRESSIONS:   1. Left ventricular ejection fraction, by visual estimation, is 50 to  55%. The left ventricle has low normal function. There is mildly increased  left ventricular hypertrophy.   2. The left ventricle has no regional wall motion abnormalities.   3. Left atrial size was normal.   4. Right atrial size was normal.   5. Trivial pericardial effusion is present.   6. The pericardial effusion is posterior to the left ventricle.   7. The mitral valve is grossly normal. No evidence of mitral valve  regurgitation.   8. The tricuspid valve is grossly normal.   9. The aortic valve is tricuspid. Aortic valve regurgitation is not  visualized.  10. The pulmonic valve was grossly normal. Pulmonic valve regurgitation is  not visualized.  11. Global right ventricle has normal systolic function.The right  ventricular size is normal. No increase in right ventricular wall  thickness.  12. TR signal is inadequate for assessing pulmonary artery systolic  pressure.  13. Intermittently seen bubbles in right heart, presumably from intavenous  infusion.   Transthoracic echocardiogram today IMPRESSIONS:   1. Left ventricular ejection fraction, by estimation, is 55 to 60%. Left  ventricular ejection fraction  by 2D MOD biplane is 59.4 %. The left  ventricle has normal function. The left ventricle has no regional wall  motion abnormalities. There is mild left  ventricular hypertrophy. Left ventricular diastolic function could not be  evaluated.   2. Right ventricular systolic function is normal. The right ventricular  size is mildly enlarged.   3. The mitral valve is grossly normal. Trivial mitral valve  regurgitation.   4. The aortic valve is tricuspid. Aortic valve regurgitation is not  visualized. No aortic stenosis is present.   5. The inferior vena cava is dilated in size with <50% respiratory  variability, suggesting right atrial pressure of 15 mmHg.   EKG: Vent. rate 145 BPM PR interval *  ms QRS duration 73 ms QT/QTcB 276/429 ms P-R-T axes * 77 54 Atrial fibrillation Left ventricular hypertrophy  Assessment and Plan: Principal Problem:   Hypotension Observation/PCU. Suspect volume depletion and oral cannabinol use. Creatinine was increased, but did not meet criteria for AKI. Continue IV hydration and monitor intake and output.   Follow-up blood pressure closely. Follow-up renal function electrolytes in AM.  Active Problems:   Paroxysmal atrial fibrillation Continue apixaban 5 mg p.o. twice daily. Keep electrolytes optimized. Cardiology consult appreciated. Cardiology will follow in a.m.    Polysubstance abuse Advised cessation of recreational substances. Explained that these may have stimulate his heart rate. And lower his blood pressure in the case of the cannabinoids.    Current smoker Tobacco use cessation advised. Nicotine replacement therapy order as needed.     Advance Care Planning:   Code Status: Full Code   Consults: Cardiology Carolanne Grumbling, MD).  Family Communication:   Severity of Illness: The appropriate patient status for this patient is OBSERVATION. Observation status is judged to be reasonable and necessary in order to provide the required  intensity of service to ensure the patient's safety. The patient's presenting symptoms, physical exam findings, and initial radiographic and laboratory data in the context of their medical condition is felt to place them at decreased risk for further clinical deterioration. Furthermore, it is anticipated that the patient will be medically stable for discharge from the hospital within 2 midnights of admission.   Author: Bobette Mo, MD 10/15/2022 10:24 AM  For on call review [email or web address].   This document was prepared using Dragon voice recognition software and may contain some unintended transcription errors.

## 2022-10-15 NOTE — Discharge Instructions (Addendum)
Information on my medicine - ELIQUIS (apixaban)  Why was Eliquis prescribed for you? Eliquis was prescribed for you to reduce the risk of a blood clot forming that can cause a stroke if you have a medical condition called atrial fibrillation (a type of irregular heartbeat).  What do You need to know about Eliquis ? Take your Eliquis TWICE DAILY - one tablet in the morning and one tablet in the evening with or without food. If you have difficulty swallowing the tablet whole please discuss with your pharmacist how to take the medication safely.  Take Eliquis exactly as prescribed by your doctor and DO NOT stop taking Eliquis without talking to the doctor who prescribed the medication.  Stopping may increase your risk of developing a stroke.  Refill your prescription before you run out.  After discharge, you should have regular check-up appointments with your healthcare provider that is prescribing your Eliquis.  In the future your dose may need to be changed if your kidney function or weight changes by a significant amount or as you get older.  What do you do if you miss a dose? If you miss a dose, take it as soon as you remember on the same day and resume taking twice daily.  Do not take more than one dose of ELIQUIS at the same time to make up a missed dose.  Important Safety Information A possible side effect of Eliquis is bleeding. You should call your healthcare provider right away if you experience any of the following: Bleeding from an injury or your nose that does not stop. Unusual colored urine (red or dark brown) or unusual colored stools (red or black). Unusual bruising for unknown reasons. A serious fall or if you hit your head (even if there is no bleeding).  Some medicines may interact with Eliquis and might increase your risk of bleeding or clotting while on Eliquis. To help avoid this, consult your healthcare provider or pharmacist prior to using any new prescription or  non-prescription medications, including herbals, vitamins, non-steroidal anti-inflammatory drugs (NSAIDs) and supplements.  This website has more information on Eliquis (apixaban): http://www.eliquis.com/eliquis/home    I have given you the information for an A-fib clinic.  I have printed some information for you about this diagnosis.  You will need to take the blood thinner that I have prescribed to you as prescribed and follow-up with the A-fib clinic.   Please do not use cocaine.

## 2022-10-16 ENCOUNTER — Telehealth (HOSPITAL_COMMUNITY): Payer: Self-pay | Admitting: Pharmacy Technician

## 2022-10-16 ENCOUNTER — Other Ambulatory Visit (HOSPITAL_COMMUNITY): Payer: Self-pay

## 2022-10-16 DIAGNOSIS — I959 Hypotension, unspecified: Secondary | ICD-10-CM | POA: Diagnosis not present

## 2022-10-16 DIAGNOSIS — F1910 Other psychoactive substance abuse, uncomplicated: Secondary | ICD-10-CM

## 2022-10-16 DIAGNOSIS — I480 Paroxysmal atrial fibrillation: Secondary | ICD-10-CM

## 2022-10-16 LAB — CBC
HCT: 38.2 % — ABNORMAL LOW (ref 39.0–52.0)
Hemoglobin: 12.5 g/dL — ABNORMAL LOW (ref 13.0–17.0)
MCH: 30.9 pg (ref 26.0–34.0)
MCHC: 32.7 g/dL (ref 30.0–36.0)
MCV: 94.6 fL (ref 80.0–100.0)
Platelets: 235 10*3/uL (ref 150–400)
RBC: 4.04 MIL/uL — ABNORMAL LOW (ref 4.22–5.81)
RDW: 12.1 % (ref 11.5–15.5)
WBC: 5 10*3/uL (ref 4.0–10.5)
nRBC: 0 % (ref 0.0–0.2)

## 2022-10-16 LAB — COMPREHENSIVE METABOLIC PANEL
ALT: 11 U/L (ref 0–44)
AST: 13 U/L — ABNORMAL LOW (ref 15–41)
Albumin: 2.7 g/dL — ABNORMAL LOW (ref 3.5–5.0)
Alkaline Phosphatase: 38 U/L (ref 38–126)
Anion gap: 3 — ABNORMAL LOW (ref 5–15)
BUN: 20 mg/dL (ref 6–20)
CO2: 26 mmol/L (ref 22–32)
Calcium: 7.8 mg/dL — ABNORMAL LOW (ref 8.9–10.3)
Chloride: 108 mmol/L (ref 98–111)
Creatinine, Ser: 1.21 mg/dL (ref 0.61–1.24)
GFR, Estimated: >60 mL/min (ref >60)
Glucose, Bld: 97 mg/dL (ref 70–99)
Potassium: 4.5 mmol/L (ref 3.5–5.1)
Sodium: 137 mmol/L (ref 135–145)
Total Bilirubin: 0.5 mg/dL (ref 0.3–1.2)
Total Protein: 4.8 g/dL — ABNORMAL LOW (ref 6.5–8.1)

## 2022-10-16 LAB — HIV ANTIBODY (ROUTINE TESTING W REFLEX): HIV Screen 4th Generation wRfx: NONREACTIVE

## 2022-10-16 MED ORDER — DILTIAZEM HCL ER 120 MG PO CP12: 120.0000 mg | ORAL_CAPSULE | Freq: Two times a day (BID) | ORAL | 1 refills | Status: DC

## 2022-10-16 MED ORDER — DILTIAZEM HCL ER 60 MG PO CP12: 120.0000 mg | ORAL_CAPSULE | Freq: Two times a day (BID) | ORAL | Status: DC

## 2022-10-16 MED ORDER — APIXABAN 5 MG PO TABS
5.0000 mg | ORAL_TABLET | Freq: Two times a day (BID) | ORAL | Status: DC
  Administered 2022-10-16 – 2022-10-17 (×3): 5 mg via ORAL
  Filled 2022-10-16 (×3): qty 1

## 2022-10-16 MED ORDER — DILTIAZEM HCL ER 60 MG PO CP12
120.0000 mg | ORAL_CAPSULE | Freq: Two times a day (BID) | ORAL | Status: DC
  Administered 2022-10-17: 120 mg via ORAL
  Filled 2022-10-16 (×2): qty 2

## 2022-10-16 MED ORDER — DILTIAZEM HCL ER 90 MG PO CP12
90.0000 mg | ORAL_CAPSULE | Freq: Two times a day (BID) | ORAL | Status: DC
  Administered 2022-10-16: 90 mg via ORAL
  Filled 2022-10-16: qty 1

## 2022-10-16 MED ORDER — AMIODARONE HCL 200 MG PO TABS: 200.0000 mg | ORAL_TABLET | Freq: Every day | ORAL | Status: DC

## 2022-10-16 MED ORDER — DILTIAZEM HCL 30 MG PO TABS
30.0000 mg | ORAL_TABLET | Freq: Once | ORAL | Status: AC
  Administered 2022-10-16: 30 mg via ORAL
  Filled 2022-10-16: qty 1

## 2022-10-16 NOTE — TOC Benefit Eligibility Note (Signed)
Patient Product/process development scientist completed.    The patient is currently admitted and upon discharge could be taking Eliquis 5 mg.  Requires Prior Authorization  The patient is insured through [employer]   This test claim was processed through [employer]- copay amounts may vary at other pharmacies due to pharmacy/plan contracts, or as the patient moves through the different stages of their insurance plan.  Roland Earl, CPHT Pharmacy Patient Advocate Specialist [facility] Patient Advocate Team Direct Number: [phone]  Fax: [phone]

## 2022-10-16 NOTE — Discharge Summary (Deleted)
Physician Discharge Summary  Pedro Gomez ZOX:[id number] DOB: 1981-01-14 DOA: 10/14/2022  PCP: Patient, No Pcp Per  Admit date: 10/14/2022 Discharge date: 10/16/2022  Admitted From: Home Disposition:  Home  Recommendations for Outpatient Follow-up:  Follow up with PCP in 1-2 weeks Cardiology (afib clinic) as scheduled  Discharge Condition:Stable  CODE STATUS:Full  Diet recommendation: Low salt low fat diet    Brief/Interim Summary: Pedro Gomez is a 42 y.o. male with medical history significant of AKI, paroxysmal atrial fibrillation, polysubstance abuse, cocaine abuse, tobacco abuse, PFO, history of cardiac arrest who presented to the emergency department with complaints of dizziness and irregular heartbeat.  He has been having palpitations since the weekend.  He used cocaine about a week ago.  He drinks caffeinated carbonated drinks daily and he smokes cigarettes.    Patient admitted as above with acute episode of A-fib with RVR, symptomatic likely secondary to polysubstance abuse including cocaine, THC and increased caffeine intake as well.  Patient evaluated by cardiology, heart rate appears to be currently well-controlled on diltiazem.  He has plans for follow-up in the outpatient setting with A-fib clinic with plans for cardioversion and to continue anticoagulation for 4 weeks after cardioversion but should be able to discontinue anticoagulation after that point given his CHA2DS2-VASc of 0.  Patient is otherwise stable and agreeable for discharge home.  Lengthy discussion with patient about need for cessation of illicit substances as well as to decrease intake on caffeine and tobacco.  If patient has recurrent symptoms recommended to return to the hospital.   Discharge Diagnoses:  Principal Problem:   Hypotension Active Problems:   Polysubstance abuse   Paroxysmal atrial fibrillation   Current smoker Hypotension-resolved  AKI-resolved, likely prerenal Paroxysmal atrial  fibrillation -rate controlled on diltiazem, continue Eliquis for 4 weeks per cardiology Polysubstance abuse-counseled on cessation  Current smoker-counseled on cessation  Discharge Instructions  Discharge Instructions     Amb Referral to AFIB Clinic   Complete by: As directed       Allergies as of 10/16/2022       Reactions   Lactose Intolerance (gi) Diarrhea        Medication List     STOP taking these medications    ACID REDUCER PO       TAKE these medications    apixaban 5 MG Tabs tablet Commonly known as: ELIQUIS Take 1 tablet (5 mg total) by mouth 2 (two) times daily.   diltiazem 120 MG 12 hr capsule Commonly known as: CARDIZEM SR Take 1 capsule (120 mg total) by mouth every 12 (twelve) hours. Start taking on: October 17, 2022        Follow-up Information     Schedule an appointment as soon as possible for a visit  with [facility] at [facility].   Specialty: Cardiology Contact information: [address] [id number] mc [place] [address] [phone]               Allergies  Allergen Reactions   Lactose Intolerance (Gi) Diarrhea    Consultations: Cardiology  Procedures/Studies: ECHOCARDIOGRAM COMPLETE  Result Date: 10/15/2022    ECHOCARDIOGRAM REPORT   Patient Name:   Pedro Gomez Date of Exam: 10/15/2022 Medical Rec #:  [id number]        Height:       74.0 in Accession #:    [id number]       Weight:       161.0 lb Date of  Birth:  [birth year]        BSA:          1.982 m Patient Age:    42 years         BP:           92/75 mmHg Patient Gender: M                HR:           69 bpm. Exam Location:  Inpatient Procedure: 2D Echo, Cardiac Doppler and Color Doppler Indications:    I48.1 Persistent atrial fibrillation  History:        Patient has prior history of Echocardiogram examinations, most                 recent 07/02/2019.  Sonographer:    Mike Gip Referring Phys: [id number] DAVID  MANUEL ORTIZ IMPRESSIONS  1. Left ventricular ejection fraction, by estimation, is 55 to 60%. Left ventricular ejection fraction by 2D MOD biplane is 59.4 %. The left ventricle has normal function. The left ventricle has no regional wall motion abnormalities. There is mild left ventricular hypertrophy. Left ventricular diastolic function could not be evaluated.  2. Right ventricular systolic function is normal. The right ventricular size is mildly enlarged.  3. The mitral valve is grossly normal. Trivial mitral valve regurgitation.  4. The aortic valve is tricuspid. Aortic valve regurgitation is not visualized. No aortic stenosis is present.  5. The inferior vena cava is dilated in size with <50% respiratory variability, suggesting right atrial pressure of 15 mmHg. Comparison(s): Changes from prior study are noted. 07/02/2019: LVEF 50-55%. FINDINGS  Left Ventricle: Left ventricular ejection fraction, by estimation, is 55 to 60%. Left ventricular ejection fraction by 2D MOD biplane is 59.4 %. The left ventricle has normal function. The left ventricle has no regional wall motion abnormalities. The left ventricular internal cavity size was normal in size. There is mild left ventricular hypertrophy. Left ventricular diastolic function could not be evaluated due to atrial fibrillation. Left ventricular diastolic function could not be evaluated. Right Ventricle: The right ventricular size is mildly enlarged. No increase in right ventricular wall thickness. Right ventricular systolic function is normal. Left Atrium: Left atrial size was normal in size. Right Atrium: Right atrial size was normal in size. Pericardium: There is no evidence of pericardial effusion. Mitral Valve: The mitral valve is grossly normal. Trivial mitral valve regurgitation. Tricuspid Valve: The tricuspid valve is grossly normal. Tricuspid valve regurgitation is trivial. Aortic Valve: The aortic valve is tricuspid. Aortic valve regurgitation is not  visualized. No aortic stenosis is present. Pulmonic Valve: The pulmonic valve was normal in structure. Pulmonic valve regurgitation is not visualized. Aorta: The aortic root and ascending aorta are structurally normal, with no evidence of dilitation. Venous: The inferior vena cava is dilated in size with less than 50% respiratory variability, suggesting right atrial pressure of 15 mmHg. IAS/Shunts: No atrial level shunt detected by color flow Doppler.  LEFT VENTRICLE PLAX 2D                        Biplane EF (MOD) LVIDd:         4.60 cm         LV Biplane EF:   Left LVIDs:         3.20 cm  ventricular LV PW:         1.20 cm                          ejection LV IVS:        1.10 cm                          fraction by LVOT diam:     2.00 cm                          2D MOD LV SV:         43                               biplane is LV SV Index:   22                               59.4 %. LVOT Area:     3.14 cm                                Diastology                                LV e' medial:    12.30 cm/s LV Volumes (MOD)               LV E/e' medial:  6.7 LV vol d, MOD    114.0 ml      LV e' lateral:   13.90 cm/s A2C:                           LV E/e' lateral: 5.9 LV vol d, MOD    90.6 ml A4C: LV vol s, MOD    46.6 ml A2C: LV vol s, MOD    37.2 ml A4C: LV SV MOD A2C:   67.4 ml LV SV MOD A4C:   90.6 ml LV SV MOD BP:    62.2 ml RIGHT VENTRICLE             IVC RV Basal diam:  4.40 cm     IVC diam: 2.60 cm RV S prime:     12.40 cm/s TAPSE (M-mode): 1.9 cm LEFT ATRIUM           Index        RIGHT ATRIUM           Index LA diam:      2.70 cm 1.36 cm/m   RA Area:     15.10 cm LA Vol (A2C): 43.8 ml 22.10 ml/m  RA Volume:   41.50 ml  20.94 ml/m LA Vol (A4C): 39.0 ml 19.68 ml/m  AORTIC VALVE LVOT Vmax:   69.80 cm/s LVOT Vmean:  52.400 cm/s LVOT VTI:    0.138 m  AORTA Ao Root diam: 3.50 cm Ao Asc diam:  3.20 cm MITRAL VALVE MV Area (PHT): 2.73 cm    SHUNTS MV Decel Time: 278 msec    Systemic VTI:   0.14 m MV E velocity: 81.80 cm/s  Systemic Diam: 2.00 cm Zoila Shutter MD Electronically signed by Zoila Shutter MD Signature  Date/Time: 10/15/2022/1:02:12 PM    Final    DG Chest Portable 1 View  Result Date: 10/14/2022 CLINICAL DATA:  Shortness of breath. Atrial fibrillation. EXAM: PORTABLE CHEST 1 VIEW COMPARISON:  Radiograph 07/10/2019 FINDINGS: The heart is normal in size.The cardiomediastinal contours are normal. The lungs are clear. Pulmonary vasculature is normal. No consolidation, pleural effusion, or pneumothorax. No acute osseous abnormalities are seen. IMPRESSION: No acute chest findings. Electronically Signed   By: Narda Rutherford M.D.   On: 10/14/2022 23:20     Subjective: No acute issues or events overnight   Discharge Exam: Vitals:   10/16/22 1211 10/16/22 1456  BP: 106/74 119/78  Pulse:    Resp:    Temp: 98.1 F (36.7 C)   SpO2: 100%    Vitals:   10/15/22 2341 10/16/22 0332 10/16/22 1211 10/16/22 1456  BP: 110/67 99/79 106/74 119/78  Pulse: 93 (!) 103    Resp: 16 18    Temp: 98.4 F (36.9 C) 98.4 F (36.9 C) 98.1 F (36.7 C)   TempSrc: Oral Oral Oral   SpO2: 97% 95% 100%   Weight:      Height:        General: Pt is alert, awake, not in acute distress Cardiovascular: Irregularly irregular, S1/S2 +, no rubs, no gallops Respiratory: CTA bilaterally, no wheezing, no rhonchi Abdominal: Soft, NT, ND, bowel sounds + Extremities: no edema, no cyanosis    The results of significant diagnostics from this hospitalization (including imaging, microbiology, ancillary and laboratory) are listed below for reference.     Microbiology: No results found for this or any previous visit (from the past 240 hour(s)).   Labs: BNP (last 3 results) No results for input(s): "BNP" in the last 8760 hours.  Basic Metabolic Panel: Recent Labs  Lab 10/14/22 2340 10/15/22 1045 10/16/22 0449  NA 139 138 137  K 4.0 4.2 4.5  CL 107 108 108  CO2 GLUCOSE 143* 93 97   BUN CREATININE 1.36* 0.96 1.21  CALCIUM 7.8* 8.1* 7.8*  MG 2.1  --   --    Liver Function Tests: Recent Labs  Lab 10/15/22 1045 10/16/22 0449  AST 11* 13*  ALT 9 11  ALKPHOS 39 38  BILITOT 0.6 0.5  PROT 4.9* 4.8*  ALBUMIN 2.7* 2.7*   CBC: Recent Labs  Lab 10/14/22 2340 10/15/22 1045 10/16/22 0449  WBC 3.8* 5.2 5.0  HGB 14.0 12.6* 12.5*  HCT 41.9 37.8* 38.2*  MCV 92.5 92.0 94.6  PLT 240 207 235   Thyroid function studies Recent Labs    10/14/22 2340  TSH 1.274   Urinalysis    Component Value Date/Time   COLORURINE YELLOW 06/30/2019 2230   APPEARANCEUR CLOUDY (A) 06/30/2019 2230   LABSPEC 1.011 06/30/2019 2230   PHURINE 7.0 06/30/2019 2230   GLUCOSEU >=500 (A) 06/30/2019 2230   HGBUR NEGATIVE 06/30/2019 2230   BILIRUBINUR Negative 11/01/2019 1451   KETONESUR NEGATIVE 06/30/2019 2230   PROTEINUR Negative 11/01/2019 1451   PROTEINUR 100 (A) 06/30/2019 2230   UROBILINOGEN 0.2 11/01/2019 1451   NITRITE Negative 11/01/2019 1451   NITRITE NEGATIVE 06/30/2019 2230   LEUKOCYTESUR Negative 11/01/2019 1451   LEUKOCYTESUR NEGATIVE 06/30/2019 2230   Sepsis Labs Recent Labs  Lab 10/14/22 2340 10/15/22 1045 10/16/22 0449  WBC 3.8* 5.2 5.0   Microbiology No results found for this or any previous visit (from the past 240 hour(s)).  Time coordinating discharge: Over 30 minutes  SIGNED:  Azucena Fallen, DO [facility] 10/16/2022, 5:10 PM Pager   If 7PM-7AM, please contact night-coverage [email or web address]

## 2022-10-16 NOTE — Progress Notes (Signed)
  Transition of Care [facility]) Screening Note   Patient Details  Name: Pedro Gomez Date of Birth: 24-Oct-1980   Transition of Care [facility]) CM/SW Contact:    Lanier Clam, RN Phone Number: 10/16/2022, 10:08 AM    Transition of Care Department [facility]) has reviewed patient and no TOC needs have been identified at this time. We will continue to monitor patient advancement through interdisciplinary progression rounds. If new patient transition needs arise, please place a TOC consult.

## 2022-10-16 NOTE — Progress Notes (Signed)
Progress Note  Pedro Gomez:[id number] DOB: Oct 14, 1980 DOA: 10/14/2022   PCP: Patient, No Pcp Per   Admit date: 10/14/2022 Discharge date: 10/16/2022   Admitted From: Home Disposition:  Home   Recommendations for Outpatient Follow-up:  Follow up with PCP in 1-2 weeks Cardiology (afib clinic) as scheduled   Discharge Condition:Stable  CODE STATUS:Full  Diet recommendation: Low salt low fat diet     Brief/Interim Summary: Pedro Gomez is a 42 y.o. male with medical history significant of AKI, paroxysmal atrial fibrillation, polysubstance abuse, cocaine abuse, tobacco abuse, PFO, history of cardiac arrest who presented to the emergency department with complaints of dizziness and irregular heartbeat.  He has been having palpitations since the weekend.  He used cocaine about a week ago.  He drinks caffeinated carbonated drinks daily and he smokes cigarettes.     Patient admitted as above with acute episode of A-fib with RVR, symptomatic likely secondary to polysubstance abuse including cocaine, THC and increased caffeine intake as well. Patient evaluated by cardiology, plan for outpatient cardioversion and follow-up in A-fib clinic. Continue anticoagulation for at least 4 weeks after cardioversion per cardiology.   Diagnoses:  Principal Problem:   Hypotension Active Problems:   Polysubstance abuse   Paroxysmal atrial fibrillation   Current smoker  Paroxysmal atrial fibrillation with RVR -rate moderately controlled on diltiazem but continues to have profound symptoms/tachycardia with exertion, continue Eliquis for 4 weeks per cardiology  Hypotension-resolved  AKI-resolved, likely prerenal Polysubstance abuse-counseled on cessation  Current smoker-counseled on cessation     Consultations: Cardiology   Procedures/Studies:  Imaging Results  ECHOCARDIOGRAM COMPLETE   Result Date: 10/15/2022    ECHOCARDIOGRAM REPORT   Patient Name:   Pedro Gomez Date of Exam:  10/15/2022 Medical Rec #:  [id number]        Height:       74.0 in Accession #:    [id number]       Weight:       161.0 lb Date of Birth:  04-Jul-1980        BSA:          1.982 m Patient Age:    42 years         BP:           92/75 mmHg Patient Gender: M                HR:           69 bpm. Exam Location:  Inpatient Procedure: 2D Echo, Cardiac Doppler and Color Doppler Indications:    I48.1 Persistent atrial fibrillation  History:        Patient has prior history of Echocardiogram examinations, most                 recent 07/02/2019.  Sonographer:    Mike Gip Referring Phys: [id number] DAVID MANUEL ORTIZ IMPRESSIONS  1. Left ventricular ejection fraction, by estimation, is 55 to 60%. Left ventricular ejection fraction by 2D MOD biplane is 59.4 %. The left ventricle has normal function. The left ventricle has no regional wall motion abnormalities. There is mild left ventricular hypertrophy. Left ventricular diastolic function could not be evaluated.  2. Right ventricular systolic function is normal. The right ventricular size is mildly enlarged.  3. The mitral valve is grossly normal. Trivial mitral valve regurgitation.  4. The aortic valve is tricuspid. Aortic valve regurgitation is not visualized. No aortic stenosis is present.  5. The inferior vena cava is dilated  in size with <50% respiratory variability, suggesting right atrial pressure of 15 mmHg. Comparison(s): Changes from prior study are noted. 07/02/2019: LVEF 50-55%. FINDINGS  Left Ventricle: Left ventricular ejection fraction, by estimation, is 55 to 60%. Left ventricular ejection fraction by 2D MOD biplane is 59.4 %. The left ventricle has normal function. The left ventricle has no regional wall motion abnormalities. The left ventricular internal cavity size was normal in size. There is mild left ventricular hypertrophy. Left ventricular diastolic function could not be evaluated due to atrial fibrillation. Left ventricular diastolic function could not be  evaluated. Right Ventricle: The right ventricular size is mildly enlarged. No increase in right ventricular wall thickness. Right ventricular systolic function is normal. Left Atrium: Left atrial size was normal in size. Right Atrium: Right atrial size was normal in size. Pericardium: There is no evidence of pericardial effusion. Mitral Valve: The mitral valve is grossly normal. Trivial mitral valve regurgitation. Tricuspid Valve: The tricuspid valve is grossly normal. Tricuspid valve regurgitation is trivial. Aortic Valve: The aortic valve is tricuspid. Aortic valve regurgitation is not visualized. No aortic stenosis is present. Pulmonic Valve: The pulmonic valve was normal in structure. Pulmonic valve regurgitation is not visualized. Aorta: The aortic root and ascending aorta are structurally normal, with no evidence of dilitation. Venous: The inferior vena cava is dilated in size with less than 50% respiratory variability, suggesting right atrial pressure of 15 mmHg. IAS/Shunts: No atrial level shunt detected by color flow Doppler.  LEFT VENTRICLE PLAX 2D                        Biplane EF (MOD) LVIDd:         4.60 cm         LV Biplane EF:   Left LVIDs:         3.20 cm                          ventricular LV PW:         1.20 cm                          ejection LV IVS:        1.10 cm                          fraction by LVOT diam:     2.00 cm                          2D MOD LV SV:         43                               biplane is LV SV Index:   22                               59.4 %. LVOT Area:     3.14 cm                                Diastology  LV e' medial:    12.30 cm/s LV Volumes (MOD)               LV E/e' medial:  6.7 LV vol d, MOD    114.0 ml      LV e' lateral:   13.90 cm/s A2C:                           LV E/e' lateral: 5.9 LV vol d, MOD    90.6 ml A4C: LV vol s, MOD    46.6 ml A2C: LV vol s, MOD    37.2 ml A4C: LV SV MOD A2C:   67.4 ml LV SV MOD A4C:   90.6 ml LV SV  MOD BP:    62.2 ml RIGHT VENTRICLE             IVC RV Basal diam:  4.40 cm     IVC diam: 2.60 cm RV S prime:     12.40 cm/s TAPSE (M-mode): 1.9 cm LEFT ATRIUM           Index        RIGHT ATRIUM           Index LA diam:      2.70 cm 1.36 cm/m   RA Area:     15.10 cm LA Vol (A2C): 43.8 ml 22.10 ml/m  RA Volume:   41.50 ml  20.94 ml/m LA Vol (A4C): 39.0 ml 19.68 ml/m  AORTIC VALVE LVOT Vmax:   69.80 cm/s LVOT Vmean:  52.400 cm/s LVOT VTI:    0.138 m  AORTA Ao Root diam: 3.50 cm Ao Asc diam:  3.20 cm MITRAL VALVE MV Area (PHT): 2.73 cm    SHUNTS MV Decel Time: 278 msec    Systemic VTI:  0.14 m MV E velocity: 81.80 cm/s  Systemic Diam: 2.00 cm Zoila Shutter MD Electronically signed by Zoila Shutter MD Signature Date/Time: 10/15/2022/1:02:12 PM    Final     DG Chest Portable 1 View   Result Date: 10/14/2022 CLINICAL DATA:  Shortness of breath. Atrial fibrillation. EXAM: PORTABLE CHEST 1 VIEW COMPARISON:  Radiograph 07/10/2019 FINDINGS: The heart is normal in size.The cardiomediastinal contours are normal. The lungs are clear. Pulmonary vasculature is normal. No consolidation, pleural effusion, or pneumothorax. No acute osseous abnormalities are seen. IMPRESSION: No acute chest findings. Electronically Signed   By: Narda Rutherford M.D.   On: 10/14/2022 23:20         Subjective: No acute issues or events overnight     Discharge Exam:     Vitals:    10/16/22 1211 10/16/22 1456  BP: 106/74 119/78  Pulse:      Resp:      Temp: 98.1 F (36.7 C)    SpO2: 100%            Vitals:    10/15/22 2341 10/16/22 0332 10/16/22 1211 10/16/22 1456  BP: 110/67 99/79 106/74 119/78  Pulse: 93 (!) 103      Resp: 16 18      Temp: 98.4 F (36.9 C) 98.4 F (36.9 C) 98.1 F (36.7 C)    TempSrc: Oral Oral Oral    SpO2: 97% 95% 100%    Weight:          Height:              General: Pt is alert, awake, not in acute distress Cardiovascular: Irregularly irregular, S1/S2 +, no  rubs, no gallops Respiratory:  CTA bilaterally, no wheezing, no rhonchi Abdominal: Soft, NT, ND, bowel sounds + Extremities: no edema, no cyanosis       The results of significant diagnostics from this hospitalization (including imaging, microbiology, ancillary and laboratory) are listed below for reference.       Microbiology: No results found for this or any previous visit (from the past 240 hour(s)).    Labs: BNP (last 3 results) Recent Labs (within last 365 days)  No results for input(s): "BNP" in the last 8760 hours.     Basic Metabolic Panel: Last Labs       Recent Labs  Lab 10/14/22 2340 10/15/22 1045 10/16/22 0449  NA 139 138 137  K 4.0 4.2 4.5  CL 107 108 108  CO2 25 24 26   GLUCOSE 143* 93 97  BUN 19 18 20   CREATININE 1.36* 0.96 1.21  CALCIUM 7.8* 8.1* 7.8*  MG 2.1  --   --       Liver Function Tests: Last Labs      Recent Labs  Lab 10/15/22 1045 10/16/22 0449  AST 11* 13*  ALT 9 11  ALKPHOS 39 38  BILITOT 0.6 0.5  PROT 4.9* 4.8*  ALBUMIN 2.7* 2.7*      CBC: Last Labs       Recent Labs  Lab 10/14/22 2340 10/15/22 1045 10/16/22 0449  WBC 3.8* 5.2 5.0  HGB 14.0 12.6* 12.5*  HCT 41.9 37.8* 38.2*  MCV 92.5 92.0 94.6  PLT 240 207 235      Thyroid function studies Recent Labs (last 2 labs)     Recent Labs    10/14/22 2340  TSH 1.274      Urinalysis Labs (Brief)          Component Value Date/Time    COLORURINE YELLOW 06/30/2019 2230    APPEARANCEUR CLOUDY (A) 06/30/2019 2230    LABSPEC 1.011 06/30/2019 2230    PHURINE 7.0 06/30/2019 2230    GLUCOSEU >=500 (A) 06/30/2019 2230    HGBUR NEGATIVE 06/30/2019 2230    BILIRUBINUR Negative 11/01/2019 1451    KETONESUR NEGATIVE 06/30/2019 2230    PROTEINUR Negative 11/01/2019 1451    PROTEINUR 100 (A) 06/30/2019 2230    UROBILINOGEN 0.2 11/01/2019 1451    NITRITE Negative 11/01/2019 1451    NITRITE NEGATIVE 06/30/2019 2230    LEUKOCYTESUR Negative 11/01/2019 1451    LEUKOCYTESUR NEGATIVE 06/30/2019 2230       Sepsis Labs Last Labs       Recent Labs  Lab 10/14/22 2340 10/15/22 1045 10/16/22 0449  WBC 3.8* 5.2 5.0      Microbiology No results found for this or any previous visit (from the past 240 hour(s)).   Time coordinating discharge: Over 30 minutes   SIGNED:   Azucena Fallen, DO [facility] 10/16/2022, 5:10 PM Pager    If 7PM-7AM, please contact night-coverage [email or web address]

## 2022-10-16 NOTE — Telephone Encounter (Signed)
Patient Advocate Encounter  Prior Authorization for Eliquis  tablets has been approved.    PA# [id number] Key: [id number] [employer] Electronic PA Form Effective dates: 10/16/2022 through 10/16/2023  Patients co-pay is $592.02 due to a $6,500 deductible.     Roland Earl, CPhT Pharmacy Patient Advocate Specialist [facility] Patient Advocate Team Direct Number: [phone]  Fax: [phone]

## 2022-10-16 NOTE — Progress Notes (Signed)
Ambulated pt in the hall HR 160-172. Per tele HR 100-160"s Afib/Aflutter. Palpating pulse very irreg. 136(hr) at Rest HR 70-80's currently in fib/flutter.... Pt was SOB with increase HR. NT to get EKG and I will update MD with findings of EKG. BP 121/64 HR 160-172 Resp 24

## 2022-10-17 ENCOUNTER — Other Ambulatory Visit (HOSPITAL_COMMUNITY): Payer: Self-pay

## 2022-10-17 DIAGNOSIS — F1910 Other psychoactive substance abuse, uncomplicated: Secondary | ICD-10-CM | POA: Diagnosis not present

## 2022-10-17 DIAGNOSIS — I480 Paroxysmal atrial fibrillation: Secondary | ICD-10-CM | POA: Diagnosis not present

## 2022-10-17 MED ORDER — DILTIAZEM HCL ER COATED BEADS 120 MG PO CP24
ORAL_CAPSULE | ORAL | 0 refills | Status: DC
  Filled 2022-10-17: qty 60, 30d supply, fill #0

## 2022-10-17 MED ORDER — DILTIAZEM HCL ER 120 MG PO CP12: 120.0000 mg | ORAL_CAPSULE | Freq: Two times a day (BID) | ORAL | 0 refills | Status: DC

## 2022-10-17 MED ORDER — APIXABAN 5 MG PO TABS
5.0000 mg | ORAL_TABLET | Freq: Two times a day (BID) | ORAL | 0 refills | Status: DC
  Filled 2022-10-17: qty 60, 30d supply, fill #0

## 2022-10-17 MED ORDER — APIXABAN 5 MG PO TABS: 5.0000 mg | ORAL_TABLET | Freq: Two times a day (BID) | ORAL | 0 refills | Status: DC

## 2022-10-17 MED ORDER — DILTIAZEM HCL ER 120 MG PO CP12
120.0000 mg | ORAL_CAPSULE | Freq: Two times a day (BID) | ORAL | 0 refills | Status: DC
  Filled 2022-10-17: qty 60, 30d supply, fill #0

## 2022-10-17 NOTE — TOC Initial Note (Signed)
Transition of Care [facility]) - Initial/Assessment Note    Patient Details  Name: Pedro Gomez MRN: [id number] Date of Birth: [birth year]  [facility] CM/SW Contact:    Adrian Prows, RN Phone Number: 10/17/2022, 2:11 PM  Clinical Narrative:                 [facility] consult for medication assist, and SA education/counseling; spoke w/ pt in room; he agrees to receiving resources for SA; pt also says he does not have enough money for medications;  pt verified he has insurance; pt's mother came into room and will pay for his medication; no pcp listed; pt says he does not have a pcp; advised pt to contact his insurance for list of providers; resources for SA placed in d/c instructions; pt will make appt of agency of choice; no TOC needs.  Expected Discharge Plan: Home/Self Care Barriers to Discharge: No Barriers Identified   Patient Goals and CMS Choice Patient states their goals for this hospitalization and ongoing recovery are:: home          Expected Discharge Plan and Services   Discharge Planning Services: CM Consult Post Acute Care Choice: NA Living arrangements for the past 2 months: Boarding House Expected Discharge Date: 10/17/22                                    Prior Living Arrangements/Services Living arrangements for the past 2 months: [employer] Lives with:: Self Patient language and need for interpreter reviewed:: Yes Do you feel safe going back to the place where you live?: Yes      Need for Family Participation in Patient Care: Yes (Comment) Care giver support system in place?: Yes (comment) Current home services:  (n/a) Criminal Activity/Legal Involvement Pertinent to Current Situation/Hospitalization: No - Comment as needed  Activities of Daily Living Home Assistive Devices/Equipment: None ADL Screening (condition at time of admission) Patient's cognitive ability adequate to safely complete daily activities?: Yes Is the  patient deaf or have difficulty hearing?: No Does the patient have difficulty seeing, even when wearing glasses/contacts?: No Does the patient have difficulty concentrating, remembering, or making decisions?: No Patient able to express need for assistance with ADLs?: Yes Does the patient have difficulty dressing or bathing?: No Independently performs ADLs?: Yes (appropriate for developmental age) Does the patient have difficulty walking or climbing stairs?: No Weakness of Legs: None Weakness of Arms/Hands: None  Permission Sought/Granted Permission sought to share information with : Case Manager Permission granted to share information with : Yes, Verbal Permission Granted  Share Information with NAME: Burnard Bunting, RN, CM     Permission granted to share info w Relationship: STELLA, ENCARNACION (Mother) [phone]     Emotional Assessment Appearance:: Appears stated age Attitude/Demeanor/Rapport: Gracious Affect (typically observed): Accepting Orientation: : Oriented to Self, Oriented to Place, Oriented to  Time, Oriented to Situation Alcohol / Substance Use: Illicit Drugs Psych Involvement: No (comment)  Admission diagnosis:  Hypotension [I95.9] Hypotension, unspecified hypotension type [I95.9] Atrial fibrillation, unspecified type [I48.91] Patient Active Problem List   Diagnosis Date Noted   Hypotension 10/15/2022   Internal derangement of left knee 12/26/2021   Cardiac arrest 07/01/2019   History of atrial fibrillation    AKI (acute kidney injury)    Encephalopathy acute    Cocaine abuse    Polysubstance abuse    Paroxysmal atrial fibrillation    PFO (patent foramen  ovale)    Current smoker    Abnormal echocardiogram 12/07/2017   Tobacco abuse 12/07/2017   PAF (paroxysmal atrial fibrillation) 12/07/2017   PCP:  Patient, No Pcp Per Pharmacy:   [id number] [place], [place] - [address] [facility] [address] [place]  [address] Phone: [phone] Fax: [phone]  [facility] [address] [place] [address] Phone: [phone] Fax: [phone]     Social Determinants of Health (SDOH) Social History: SDOH Screenings   Food Insecurity: No Food Insecurity (10/15/2022)  Housing: Low Risk  (10/15/2022)  Transportation Needs: No Transportation Needs (10/15/2022)  Utilities: Not At Risk (10/15/2022)  Depression (PHQ2-9): Low Risk  (11/01/2019)  Tobacco Use: High Risk (10/15/2022)   SDOH Interventions:     Readmission Risk Interventions     No data to display

## 2022-10-17 NOTE — Plan of Care (Signed)
  Problem: Safety: Goal: Ability to remain free from injury will improve Outcome: Progressing   Problem: Pain Managment: Goal: General experience of comfort will improve Outcome: Progressing   Problem: Coping: Goal: Level of anxiety will decrease Outcome: Progressing   Problem: Clinical Measurements: Goal: Ability to maintain clinical measurements within normal limits will improve Outcome: Progressing   

## 2022-10-17 NOTE — Progress Notes (Signed)
The patient ambulated 200 feet. Heart rate 116-124, Oxygen saturation 86-92% during activity. Pt denied dizziness, weakness, CP, palpitations, SOB or nausea. Pt reports tolerating activity well. VS reassessed upon return to room.   10/17/22 1027  Vitals  BP 111/76  MAP (mmHg) 87  BP Location Left Arm  BP Method Automatic  Patient Position (if appropriate) Sitting  Pulse Rate (!) 102  Pulse Rate Source Monitor  Resp 20  MEWS COLOR  MEWS Score Color Green  Oxygen Therapy  SpO2 99 %  O2 Device Room Air  MEWS Score  MEWS Temp 0  MEWS Systolic 0  MEWS Pulse 1  MEWS RR 0  MEWS LOC 0  MEWS Score 1

## 2022-10-17 NOTE — Discharge Summary (Signed)
Physician Discharge Summary  Pedro Gomez ZOX:[id number] DOB: 07-26-1980 DOA: 10/14/2022  PCP: Patient, No Pcp Per  Admit date: 10/14/2022 Discharge date: 10/17/2022  Admitted From: Home Disposition:  Home   Recommendations for Outpatient Follow-up:  Follow up with PCP in 1-2 weeks Cardiology (afib clinic) as scheduled   Discharge Condition:Stable  CODE STATUS:Full  Diet recommendation: Low salt low fat diet     Brief/Interim Summary: Pedro Gomez is a 42 y.o. male with medical history significant of AKI, paroxysmal atrial fibrillation, polysubstance abuse, cocaine abuse, tobacco abuse, PFO, history of cardiac arrest who presented to the emergency department with complaints of dizziness and irregular heartbeat.  He has been having palpitations since the weekend.  He used cocaine about a week ago.  He drinks caffeinated carbonated drinks daily and he smokes cigarettes.     Patient admitted as above with acute episode of A-fib with RVR, symptomatic likely secondary to polysubstance abuse including cocaine, THC and increased caffeine intake as well. Patient evaluated by cardiology, plan for outpatient cardioversion and follow-up in A-fib clinic. Continue anticoagulation for at least 4 weeks after cardioversion per cardiology.  Patient symptoms have improved drastically, no further episodes of palpitations dyspnea shortness of breath with exertion.  Otherwise stable and agreeable for discharge home  Discharge Diagnoses:  Principal Problem:   Hypotension Active Problems:   Polysubstance abuse   Paroxysmal atrial fibrillation   Current smoker  Paroxysmal atrial fibrillation with RVR -rate controlled on diltiazem, follow-up with cardiology as scheduled for cardioversion as discussed  Hypotension-resolved  AKI-resolved, likely prerenal Polysubstance abuse-counseled on cessation  Current smoker-counseled on cessation     Consultations: Cardiology   Discharge  Instructions  Discharge Instructions     Amb Referral to AFIB Clinic   Complete by: As directed       Allergies as of 10/17/2022       Reactions   Lactose Intolerance (gi) Diarrhea        Medication List     STOP taking these medications    ACID REDUCER PO       TAKE these medications    apixaban 5 MG Tabs tablet Commonly known as: ELIQUIS Take 1 tablet (5 mg total) by mouth 2 (two) times daily.   diltiazem 120 MG 12 hr capsule Commonly known as: CARDIZEM SR Take 1 capsule (120 mg total) by mouth every 12 (twelve) hours.        Follow-up Information     Schedule an appointment as soon as possible for a visit  with [facility] at [facility].   Specialty: Cardiology Contact information: [address] [id number] mc [place] [address] [phone]               Allergies  Allergen Reactions   Lactose Intolerance (Gi) Diarrhea     Procedures/Studies: ECHOCARDIOGRAM COMPLETE  Result Date: 10/15/2022    ECHOCARDIOGRAM REPORT   Patient Name:   Pedro Gomez Date of Exam: 10/15/2022 Medical Rec #:  [id number]        Height:       74.0 in Accession #:    [id number]       Weight:       161.0 lb Date of Birth:  1981/06/03        BSA:          1.982 m Patient Age:    42 years         BP:  92/75 mmHg Patient Gender: M                HR:           69 bpm. Exam Location:  Inpatient Procedure: 2D Echo, Cardiac Doppler and Color Doppler Indications:    I48.1 Persistent atrial fibrillation  History:        Patient has prior history of Echocardiogram examinations, most                 recent 07/02/2019.  Sonographer:    Mike Gip Referring Phys: [id number] DAVID MANUEL ORTIZ IMPRESSIONS  1. Left ventricular ejection fraction, by estimation, is 55 to 60%. Left ventricular ejection fraction by 2D MOD biplane is 59.4 %. The left ventricle has normal function. The left ventricle has no regional wall motion  abnormalities. There is mild left ventricular hypertrophy. Left ventricular diastolic function could not be evaluated.  2. Right ventricular systolic function is normal. The right ventricular size is mildly enlarged.  3. The mitral valve is grossly normal. Trivial mitral valve regurgitation.  4. The aortic valve is tricuspid. Aortic valve regurgitation is not visualized. No aortic stenosis is present.  5. The inferior vena cava is dilated in size with <50% respiratory variability, suggesting right atrial pressure of 15 mmHg. Comparison(s): Changes from prior study are noted. 07/02/2019: LVEF 50-55%. FINDINGS  Left Ventricle: Left ventricular ejection fraction, by estimation, is 55 to 60%. Left ventricular ejection fraction by 2D MOD biplane is 59.4 %. The left ventricle has normal function. The left ventricle has no regional wall motion abnormalities. The left ventricular internal cavity size was normal in size. There is mild left ventricular hypertrophy. Left ventricular diastolic function could not be evaluated due to atrial fibrillation. Left ventricular diastolic function could not be evaluated. Right Ventricle: The right ventricular size is mildly enlarged. No increase in right ventricular wall thickness. Right ventricular systolic function is normal. Left Atrium: Left atrial size was normal in size. Right Atrium: Right atrial size was normal in size. Pericardium: There is no evidence of pericardial effusion. Mitral Valve: The mitral valve is grossly normal. Trivial mitral valve regurgitation. Tricuspid Valve: The tricuspid valve is grossly normal. Tricuspid valve regurgitation is trivial. Aortic Valve: The aortic valve is tricuspid. Aortic valve regurgitation is not visualized. No aortic stenosis is present. Pulmonic Valve: The pulmonic valve was normal in structure. Pulmonic valve regurgitation is not visualized. Aorta: The aortic root and ascending aorta are structurally normal, with no evidence of dilitation.  Venous: The inferior vena cava is dilated in size with less than 50% respiratory variability, suggesting right atrial pressure of 15 mmHg. IAS/Shunts: No atrial level shunt detected by color flow Doppler.  LEFT VENTRICLE PLAX 2D                        Biplane EF (MOD) LVIDd:         4.60 cm         LV Biplane EF:   Left LVIDs:         3.20 cm                          ventricular LV PW:         1.20 cm                          ejection LV IVS:  1.10 cm                          fraction by LVOT diam:     2.00 cm                          2D MOD LV SV:         43                               biplane is LV SV Index:   22                               59.4 %. LVOT Area:     3.14 cm                                Diastology                                LV e' medial:    12.30 cm/s LV Volumes (MOD)               LV E/e' medial:  6.7 LV vol d, MOD    114.0 ml      LV e' lateral:   13.90 cm/s A2C:                           LV E/e' lateral: 5.9 LV vol d, MOD    90.6 ml A4C: LV vol s, MOD    46.6 ml A2C: LV vol s, MOD    37.2 ml A4C: LV SV MOD A2C:   67.4 ml LV SV MOD A4C:   90.6 ml LV SV MOD BP:    62.2 ml RIGHT VENTRICLE             IVC RV Basal diam:  4.40 cm     IVC diam: 2.60 cm RV S prime:     12.40 cm/s TAPSE (M-mode): 1.9 cm LEFT ATRIUM           Index        RIGHT ATRIUM           Index LA diam:      2.70 cm 1.36 cm/m   RA Area:     15.10 cm LA Vol (A2C): 43.8 ml 22.10 ml/m  RA Volume:   41.50 ml  20.94 ml/m LA Vol (A4C): 39.0 ml 19.68 ml/m  AORTIC VALVE LVOT Vmax:   69.80 cm/s LVOT Vmean:  52.400 cm/s LVOT VTI:    0.138 m  AORTA Ao Root diam: 3.50 cm Ao Asc diam:  3.20 cm MITRAL VALVE MV Area (PHT): 2.73 cm    SHUNTS MV Decel Time: 278 msec    Systemic VTI:  0.14 m MV E velocity: 81.80 cm/s  Systemic Diam: 2.00 cm Zoila Shutter MD Electronically signed by Zoila Shutter MD Signature Date/Time: 10/15/2022/1:02:12 PM    Final    DG Chest Portable 1 View  Result Date: 10/14/2022 CLINICAL DATA:  Shortness  of breath. Atrial fibrillation. EXAM: PORTABLE CHEST 1 VIEW COMPARISON:  Radiograph 07/10/2019 FINDINGS: The heart is normal in size.The cardiomediastinal contours are normal. The  lungs are clear. Pulmonary vasculature is normal. No consolidation, pleural effusion, or pneumothorax. No acute osseous abnormalities are seen. IMPRESSION: No acute chest findings. Electronically Signed   By: Narda Rutherford M.D.   On: 10/14/2022 23:20     Subjective: No acute issues/events overnight   Discharge Exam: Vitals:   10/16/22 2100 10/17/22 0551  BP: 110/71 102/80  Pulse: 81 87  Resp:  19  Temp: 98.2 F (36.8 C) 98.1 F (36.7 C)  SpO2:  96%   Vitals:   10/16/22 1211 10/16/22 1456 10/16/22 2100 10/17/22 0551  BP: 106/74 119/78 110/71 102/80  Pulse:   81 87  Resp:    19  Temp: 98.1 F (36.7 C)  98.2 F (36.8 C) 98.1 F (36.7 C)  TempSrc: Oral  Oral Oral  SpO2: 100%   96%  Weight:      Height:        General: Pt is alert, awake, not in acute distress Cardiovascular: RRR, S1/S2 +, no rubs, no gallops Respiratory: CTA bilaterally, no wheezing, no rhonchi Abdominal: Soft, NT, ND, bowel sounds + Extremities: no edema, no cyanosis    The results of significant diagnostics from this hospitalization (including imaging, microbiology, ancillary and laboratory) are listed below for reference.     Microbiology: No results found for this or any previous visit (from the past 240 hour(s)).   Labs: BNP (last 3 results) No results for input(s): "BNP" in the last 8760 hours. Basic Metabolic Panel: Recent Labs  Lab 10/14/22 2340 10/15/22 1045 10/16/22 0449  NA 139 138 137  K 4.0 4.2 4.5  CL 107 108 108  CO2 25 24 26   GLUCOSE 143* 93 97  BUN 19 18 20   CREATININE 1.36* 0.96 1.21  CALCIUM 7.8* 8.1* 7.8*  MG 2.1  --   --    Liver Function Tests: Recent Labs  Lab 10/15/22 1045 10/16/22 0449  AST 11* 13*  ALT 9 11  ALKPHOS 39 38  BILITOT 0.6 0.5  PROT 4.9* 4.8*  ALBUMIN 2.7* 2.7*    No results for input(s): "LIPASE", "AMYLASE" in the last 168 hours. No results for input(s): "AMMONIA" in the last 168 hours. CBC: Recent Labs  Lab 10/14/22 2340 10/15/22 1045 10/16/22 0449  WBC 3.8* 5.2 5.0  HGB 14.0 12.6* 12.5*  HCT 41.9 37.8* 38.2*  MCV 92.5 92.0 94.6  PLT 240 207 235   Cardiac Enzymes: No results for input(s): "CKTOTAL", "CKMB", "CKMBINDEX", "TROPONINI" in the last 168 hours. BNP: Invalid input(s): "POCBNP" CBG: No results for input(s): "GLUCAP" in the last 168 hours. D-Dimer No results for input(s): "DDIMER" in the last 72 hours. Hgb A1c No results for input(s): "HGBA1C" in the last 72 hours. Lipid Profile No results for input(s): "CHOL", "HDL", "LDLCALC", "TRIG", "CHOLHDL", "LDLDIRECT" in the last 72 hours. Thyroid function studies Recent Labs    10/14/22 2340  TSH 1.274   Anemia work up No results for input(s): "VITAMINB12", "FOLATE", "FERRITIN", "TIBC", "IRON", "RETICCTPCT" in the last 72 hours. Urinalysis    Component Value Date/Time   COLORURINE YELLOW 06/30/2019 2230   APPEARANCEUR CLOUDY (A) 06/30/2019 2230   LABSPEC 1.011 06/30/2019 2230   PHURINE 7.0 06/30/2019 2230   GLUCOSEU >=500 (A) 06/30/2019 2230   HGBUR NEGATIVE 06/30/2019 2230   BILIRUBINUR Negative 11/01/2019 1451   KETONESUR NEGATIVE 06/30/2019 2230   PROTEINUR Negative 11/01/2019 1451   PROTEINUR 100 (A) 06/30/2019 2230   UROBILINOGEN 0.2 11/01/2019 1451   NITRITE Negative 11/01/2019 1451   NITRITE  NEGATIVE 06/30/2019 2230   LEUKOCYTESUR Negative 11/01/2019 1451   LEUKOCYTESUR NEGATIVE 06/30/2019 2230   Sepsis Labs Recent Labs  Lab 10/14/22 2340 10/15/22 1045 10/16/22 0449  WBC 3.8* 5.2 5.0   Microbiology No results found for this or any previous visit (from the past 240 hour(s)).   Time coordinating discharge: Over 30 minutes  SIGNED:   Azucena Fallen, DO [facility] 10/17/2022, 7:54 AM Pager   If 7PM-7AM, please contact  night-coverage [email or web address]

## 2022-10-17 NOTE — Progress Notes (Addendum)
The patient expressed concerns regarding medication costs. He received a coupon for Apixaban (Eliquis)prescription. Per [facility] copay $10.  [facility] [address] [facility] and they do not have medications in [facility]. [facility] and medications [facility]em and Apixaban are available at [facility] location in [place].  Out of pocket cost for diltiazem $33.33. Pt states he is unable to afford total out of pocket costs for both medications.  Transition of care order placed for recommendations.  Hospitalist contacted and request to send medications to [facility] outpatient pharmacy.

## 2022-10-17 NOTE — Progress Notes (Signed)
The patient is stable, no change from am assessment. He is alert, oriented x4 and ambulatory without assistance. Patient denied concerns, questions regarding dc instructions.

## 2022-10-20 ENCOUNTER — Other Ambulatory Visit: Payer: Self-pay

## 2022-10-20 ENCOUNTER — Emergency Department (HOSPITAL_COMMUNITY)
Admission: EM | Admit: 2022-10-20 | Discharge: 2022-10-21 | Discharge status: Against Medical Advice | Payer: Commercial Managed Care - HMO | Attending: Emergency Medicine | Admitting: Emergency Medicine

## 2022-10-20 DIAGNOSIS — Z5321 Procedure and treatment not carried out due to patient leaving prior to being seen by health care provider: Secondary | ICD-10-CM | POA: Insufficient documentation

## 2022-10-20 DIAGNOSIS — R112 Nausea with vomiting, unspecified: Secondary | ICD-10-CM | POA: Insufficient documentation

## 2022-10-20 DIAGNOSIS — R1084 Generalized abdominal pain: Secondary | ICD-10-CM | POA: Diagnosis present

## 2022-10-20 LAB — URINALYSIS, ROUTINE W REFLEX MICROSCOPIC
Bilirubin Urine: NEGATIVE
Glucose, UA: 150 mg/dL — AB
Hgb urine dipstick: NEGATIVE
Ketones, ur: 5 mg/dL — AB
Leukocytes,Ua: NEGATIVE
Nitrite: NEGATIVE
Protein, ur: NEGATIVE mg/dL
Specific Gravity, Urine: 1.019 (ref 1.005–1.030)
pH: 5 (ref 5.0–8.0)

## 2022-10-20 LAB — CBC WITH DIFFERENTIAL/PLATELET
Abs Immature Granulocytes: 0.03 10*3/uL (ref 0.00–0.07)
Basophils Absolute: 0 10*3/uL (ref 0.0–0.1)
Basophils Relative: 0 %
Eosinophils Absolute: 0.2 10*3/uL (ref 0.0–0.5)
Eosinophils Relative: 2 %
HCT: 41.4 % (ref 39.0–52.0)
Hemoglobin: 14.1 g/dL (ref 13.0–17.0)
Immature Granulocytes: 0 %
Lymphocytes Relative: 15 %
Lymphs Abs: 1.4 10*3/uL (ref 0.7–4.0)
MCH: 31.3 pg (ref 26.0–34.0)
MCHC: 34.1 g/dL (ref 30.0–36.0)
MCV: 92 fL (ref 80.0–100.0)
Monocytes Absolute: 0.6 10*3/uL (ref 0.1–1.0)
Monocytes Relative: 7 %
Neutro Abs: 6.8 10*3/uL (ref 1.7–7.7)
Neutrophils Relative %: 76 %
Platelets: 291 10*3/uL (ref 150–400)
RBC: 4.5 MIL/uL (ref 4.22–5.81)
RDW: 12 % (ref 11.5–15.5)
WBC: 9 10*3/uL (ref 4.0–10.5)
nRBC: 0 % (ref 0.0–0.2)

## 2022-10-20 LAB — COMPREHENSIVE METABOLIC PANEL
ALT: 15 U/L (ref 0–44)
AST: 18 U/L (ref 15–41)
Albumin: 3.6 g/dL (ref 3.5–5.0)
Alkaline Phosphatase: 47 U/L (ref 38–126)
Anion gap: 11 (ref 5–15)
BUN: 13 mg/dL (ref 6–20)
CO2: 25 mmol/L (ref 22–32)
Calcium: 8.6 mg/dL — ABNORMAL LOW (ref 8.9–10.3)
Chloride: 101 mmol/L (ref 98–111)
Creatinine, Ser: 1.11 mg/dL (ref 0.61–1.24)
GFR, Estimated: >60 mL/min (ref >60)
Glucose, Bld: 183 mg/dL — ABNORMAL HIGH (ref 70–99)
Potassium: 3.7 mmol/L (ref 3.5–5.1)
Sodium: 137 mmol/L (ref 135–145)
Total Bilirubin: 0.9 mg/dL (ref 0.3–1.2)
Total Protein: 6 g/dL — ABNORMAL LOW (ref 6.5–8.1)

## 2022-10-20 LAB — LIPASE, BLOOD: Lipase: 24 U/L (ref 11–51)

## 2022-10-20 MED ORDER — ONDANSETRON 4 MG PO TBDP
8.0000 mg | ORAL_TABLET | Freq: Once | ORAL | Status: AC
  Administered 2022-10-20: 8 mg via ORAL
  Filled 2022-10-20: qty 2

## 2022-10-20 NOTE — ED Provider Triage Note (Signed)
Emergency Medicine Provider Triage Evaluation Note  Pedro Gomez , a 42 y.o. male  was evaluated in triage.  Pt complains of generalized abdominal pain with nausea and vomiting onset this morning, also diaphoresis.  Denies blood in emesis or stools, no changes in bowel or bladder habits.  Recently admitted for A-fib, polysubstance abuse. On dilt and eliquis  Mom with patient, reports color to be normal, not pale, hx GERD, not on his regular antacid due to his new meds (dilt eliquis).  Review of Systems  Positive:  Negative:   Physical Exam  There were no vitals taken for this visit. Gen:   Awake, no distress   Resp:  Normal effort  MSK:   Moves extremities without difficulty  Other:  Appears generally weak  Medical Decision Making  Medically screening exam initiated at 3:41 PM.  Appropriate orders placed.  LENVILLE HIBBERD was informed that the remainder of the evaluation will be completed by another provider, this initial triage assessment does not replace that evaluation, and the importance of remaining in the ED until their evaluation is complete.     Jeannie Fend, PA-C 10/20/22 1542

## 2022-10-20 NOTE — ED Triage Notes (Signed)
Patient arrives for eval of generalized abdominal pain and n/v since this morning. Was diagnosed with acid reflux but is unable to take that medication d/t interactions with Cardizem and Eliquis.

## 2022-10-21 NOTE — ED Provider Notes (Signed)
Appears from nursing notes that patient left after triage. I did not see or manage care for this patient.    Evalene Vath, Barbara Cower, MD 10/21/22 [phone]

## 2022-10-21 NOTE — ED Notes (Signed)
Pt called multiple times no answer 

## 2022-10-29 ENCOUNTER — Ambulatory Visit (HOSPITAL_COMMUNITY)
Admission: RE | Admit: 2022-10-29 | Discharge: 2022-10-29 | Disposition: A | Discharge status: Home / Self Care | Payer: Commercial Managed Care - HMO | Source: Ambulatory Visit | Attending: Internal Medicine | Admitting: Internal Medicine

## 2022-10-29 VITALS — BP 80/60 | HR 84 | Ht 74.0 in | Wt 155.6 lb

## 2022-10-29 DIAGNOSIS — Z79899 Other long term (current) drug therapy: Secondary | ICD-10-CM | POA: Insufficient documentation

## 2022-10-29 DIAGNOSIS — F17210 Nicotine dependence, cigarettes, uncomplicated: Secondary | ICD-10-CM | POA: Insufficient documentation

## 2022-10-29 DIAGNOSIS — Z7901 Long term (current) use of anticoagulants: Secondary | ICD-10-CM | POA: Insufficient documentation

## 2022-10-29 DIAGNOSIS — I480 Paroxysmal atrial fibrillation: Secondary | ICD-10-CM | POA: Diagnosis not present

## 2022-10-29 MED ORDER — DILTIAZEM HCL ER 120 MG PO CP12: 120.0000 mg | ORAL_CAPSULE | Freq: Every day | ORAL | 1 refills | Status: DC

## 2022-10-29 MED ORDER — APIXABAN 5 MG PO TABS: 5.0000 mg | ORAL_TABLET | Freq: Two times a day (BID) | ORAL | 1 refills | Status: DC

## 2022-10-29 NOTE — Patient Instructions (Addendum)
Decrease Cardizem (Diltiazem) to 120mg  once a day

## 2022-10-29 NOTE — Progress Notes (Signed)
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 Remote history of Afib back in 2019 and underwent successful DCCV at that time. TEE done 12/13/17 showed PFO. History of hospitalization in January 2021 for acute respiratory failure and hypoxemia complicated by cardiac arrest and aspiration pneumonia; reportedly doing cocaine at that time. The patient was hospitalized 4/17-20/2024 due to Afib with RVR presenting with symptoms of heart palpitations, dizziness, and shortness of breath. He was hypotensive despite rehydration and had elevated creatinine at admission (improved upon discharge). He was discharged on Eliquis 5 mg BID for unknown duration of Afib and diltiazem 120 mg BID. Patient is on Eliquis 5 mg for a CHADS2VASC score of 0.  On evaluation today, he is in SR. He felt the spontaneous conversion to SR sometime last week. He has been compliant with his medications. He is here with his mother today. No chest pain, shortness of breath, swelling, or dizziness. He admits that about a week before hospital admission he felt brief fluttering but it resolved. This was around the time he used cocaine. He states on his birthday (day of admission) he had a THC gummy, a joint, and a beer. He notes after the joint and beer he felt his heart fluttering fast. He currently denies any chest pain or shortness of breath and feels well. He denies any dizziness. He does not have a PCP.   He is compliant with anticoagulation and has not missed any doses. He has no bleeding concerns.  Today, he denies symptoms of palpitations, chest pain, shortness of breath, orthopnea,  PND, lower extremity edema, dizziness, presyncope, syncope, snoring, daytime somnolence, bleeding, or neurologic sequela. The patient is tolerating medications without difficulties and is otherwise without complaint today.   Atrial Fibrillation Risk Factors:  he does not have symptoms or diagnosis of sleep apnea. he does not have a history of rheumatic fever. he does have a history of alcohol use. The patient does not have a history of early familial atrial fibrillation or other arrhythmias.  he has a BMI of Body mass index is 19.98 kg/m.Marland Kitchen Filed Weights   10/29/22 1324  Weight: 70.6 kg    Family History  Problem Relation Age of Onset   Hypertension Mother    Hypertension Father      Atrial Fibrillation Management history:  Previous antiarrhythmic drugs: None Previous cardioversions: 2019 Previous ablations: None Anticoagulation history: Eliquis 5 mg BID   Past Medical History:  Diagnosis Date   Atrial fibrillation (HCC)    Polysubstance abuse (HCC)    Tobacco abuse    Past Surgical History:  Procedure Laterality Date   TEE WITHOUT CARDIOVERSION N/A 12/13/2017   Procedure: TRANSESOPHAGEAL ECHOCARDIOGRAM (TEE);  Surgeon: Chrystie Nose, MD;  Location: [facility] ENDOSCOPY;  Service: Cardiovascular;  Laterality: N/A;    Current Outpatient Medications  Medication Sig Dispense Refill   apixaban (ELIQUIS) 5 MG TABS tablet Take 1 tablet (5 mg total) by mouth 2 (two) times daily. 60 tablet 0   apixaban (ELIQUIS) 5 MG TABS tablet Take 1 tablet (5 mg total) by mouth 2 (two) times daily. 60 tablet 0   diltiazem (CARDIZEM SR) 120 MG 12 hr capsule Take 1 capsule (120  mg total) by mouth every 12 (twelve) hours. 60 capsule 0   No current facility-administered medications for this encounter.    No Active Allergies  Social History   Socioeconomic History   Marital status: Single    Spouse name: Not on file   Number of children: Not on file   Years of education: Not on file    Highest education level: Not on file  Occupational History   Not on file  Tobacco Use   Smoking status: Every Day    Types: Cigarettes   Smokeless tobacco: Never  Vaping Use   Vaping Use: Never used  Substance and Sexual Activity   Alcohol use: Yes   Drug use: Yes    Types: Marijuana   Sexual activity: Yes  Other Topics Concern   Not on file  Social History Narrative   ** Merged History Encounter **       Social Determinants of Health   Financial Resource Strain: Not on file  Food Insecurity: No Food Insecurity (10/15/2022)   Hunger Vital Sign    Worried About Running Out of Food in the Last Year: Never true    Ran Out of Food in the Last Year: Never true  Transportation Needs: No Transportation Needs (10/15/2022)   PRAPARE - Administrator, Civil Service (Medical): No    Lack of Transportation (Non-Medical): No  Physical Activity: Not on file  Stress: Not on file  Social Connections: Not on file  Intimate Partner Violence: Not At Risk (10/15/2022)   Humiliation, Afraid, Rape, and Kick questionnaire    Fear of Current or Ex-Partner: No    Emotionally Abused: No    Physically Abused: No    Sexually Abused: No     ROS- All systems are reviewed and negative except as per the HPI above.  Physical Exam: Vitals:   10/29/22 1324  Weight: 70.6 kg  Height: 6\' 2"  (1.88 m)    GEN- The patient is a well appearing male, alert and oriented x 3 today.   Head- normocephalic, atraumatic Eyes-  Sclera clear, conjunctiva pink Ears- hearing intact Oropharynx- clear Neck- supple  Lungs- Clear to ausculation bilaterally, normal work of breathing Heart- Regular rate and rhythm, no murmurs, rubs or gallops  GI- soft, NT, ND, + BS Extremities- no clubbing, cyanosis, or edema MS- no significant deformity or atrophy Skin- no rash or lesion Psych- euthymic mood, full affect Neuro- strength and sensation are intact  Wt Readings from Last 3 Encounters:  10/29/22 70.6 kg   10/14/22 73 kg  11/01/19 76.6 kg    EKG today demonstrates  Vent. rate 84 BPM PR interval 88 ms QRS duration 90 ms QT/QTcB 334/394 ms P-R-T axes * 86 44 Sinus rhythm with short PR Minimal voltage criteria for LVH, may be normal variant ( Sokolow-Lyon ) Borderline ECG When compared with ECG of 20-Oct-2022 16:06, PREVIOUS ECG IS PRESENT  Echo 10/15/22 demonstrated:  1. Left ventricular ejection fraction, by estimation, is 55 to 60%. Left  ventricular ejection fraction by 2D MOD biplane is 59.4 %. The left  ventricle has normal function. The left ventricle has no regional wall  motion abnormalities. There is mild left  ventricular hypertrophy. Left ventricular diastolic function could not be  evaluated.   2. Right ventricular systolic function is normal. The right ventricular  size is mildly enlarged.   3. The mitral valve is grossly normal. Trivial mitral valve  regurgitation.   4. The aortic valve is tricuspid.  Aortic valve regurgitation is not  visualized. No aortic stenosis is present.   5. The inferior vena cava is dilated in size with <50% respiratory  variability, suggesting right atrial pressure of 15 mmHg.  Epic records are reviewed at length today.  CHA2DS2-VASc Score = 0  The patient's score is based upon: CHF History: 0 HTN History: 0 Diabetes History: 0 Stroke History: 0 Vascular Disease History: 0 Age Score: 0 Gender Score: 0       ASSESSMENT AND PLAN: Paroxysmal Atrial Fibrillation (ICD10:  I48.0) The patient's CHA2DS2-VASc score is 0, indicating a 0.2% annual risk of stroke.    Education provided about Afib with visual diagram. We briefly spoke about treatment options but at this time will continue with conservative observation.   Since he converted spontaneously to sinus rhythm last week, we will have continue Eliquis 5 mg BID for 4 weeks.  He will transition to diltiazem 120 mg once daily. F/u 1 month and at that time likely discontinue both  medications. Recommended monitoring device for Afib going forward.   I discussed with patient only in room cessation of cocaine and THC gummies as the combination of illicit substances plus or minus alcohol could have possibly triggered his most recent episode of Afib. He voiced understanding.      Follow up in 1 month Afib clinic.   Lake Bells, PA-C Afib Clinic [facility] [address] [place], [place] [address] [phone] 10/29/2022 1:29 PM

## 2022-10-30 ENCOUNTER — Other Ambulatory Visit (HOSPITAL_COMMUNITY): Payer: Self-pay

## 2022-11-27 ENCOUNTER — Other Ambulatory Visit: Payer: Self-pay

## 2022-12-03 ENCOUNTER — Other Ambulatory Visit (HOSPITAL_COMMUNITY): Payer: Self-pay

## 2022-12-08 ENCOUNTER — Encounter (HOSPITAL_COMMUNITY): Payer: Self-pay | Admitting: Internal Medicine

## 2022-12-08 ENCOUNTER — Ambulatory Visit (HOSPITAL_COMMUNITY)
Admission: RE | Admit: 2022-12-08 | Discharge: 2022-12-08 | Disposition: A | Discharge status: Home / Self Care | Payer: Commercial Managed Care - HMO | Source: Ambulatory Visit | Attending: Internal Medicine | Admitting: Internal Medicine

## 2022-12-08 VITALS — BP 126/84 | HR 89 | Ht 74.0 in | Wt 161.0 lb

## 2022-12-08 DIAGNOSIS — I480 Paroxysmal atrial fibrillation: Secondary | ICD-10-CM | POA: Insufficient documentation

## 2022-12-08 DIAGNOSIS — Z7901 Long term (current) use of anticoagulants: Secondary | ICD-10-CM | POA: Insufficient documentation

## 2022-12-08 MED ORDER — DILTIAZEM HCL ER 120 MG PO CP12: 120.0000 mg | ORAL_CAPSULE | Freq: Every day | ORAL | 1 refills | Status: AC

## 2022-12-08 NOTE — Patient Instructions (Signed)
Stop eliquis and cardizem

## 2022-12-08 NOTE — Progress Notes (Signed)
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 Remote history of Afib back in 2019 and underwent successful DCCV at that time. TEE done 12/13/17 showed PFO. History of hospitalization in January 2021 for acute respiratory failure and hypoxemia complicated by cardiac arrest and aspiration pneumonia; reportedly doing cocaine at that time. The patient was hospitalized 4/17-20/2024 due to Afib with RVR presenting with symptoms of heart palpitations, dizziness, and shortness of breath. He was hypotensive despite rehydration and had elevated creatinine at admission (improved upon discharge). He was discharged on Eliquis 5 mg BID for unknown duration of Afib and diltiazem 120 mg BID. Patient is on Eliquis 5 mg for a CHADS2VASC score of 0.  On evaluation today, he is in SR. He felt the spontaneous conversion to SR sometime last week. He has been compliant with his medications. He is here with his mother today. No chest pain, shortness of breath, swelling, or dizziness. He admits that about a week before hospital admission he felt brief fluttering but it resolved. This was around the time he used cocaine. He states on his birthday (day of admission) he had a THC gummy, a joint, and a beer. He notes after the joint and beer he felt his heart fluttering fast. He currently denies any chest pain or shortness of breath and feels well. He denies any dizziness. He does not have a PCP.   He is compliant with anticoagulation and has not missed any doses. He has no bleeding concerns.  On follow up 12/08/22, he is in NSR. Since last office visit, he has felt well. No  episodes of Afib since last office visit.    Today, he denies symptoms of palpitations, chest pain, shortness of breath, orthopnea, PND, lower extremity edema, dizziness, presyncope, syncope, snoring, daytime somnolence, bleeding, or neurologic sequela. The patient is tolerating medications without difficulties and is otherwise without complaint today.   Atrial Fibrillation Risk Factors:  he does not have symptoms or diagnosis of sleep apnea. he does not have a history of rheumatic fever. he does have a history of alcohol use. The patient does not have a history of early familial atrial fibrillation or other arrhythmias.  he has a BMI of Body mass index is 20.67 kg/m.Marland Kitchen Filed Weights   12/08/22 1357  Weight: 73 kg     Family History  Problem Relation Age of Onset   Hypertension Mother    Hypertension Father     Atrial Fibrillation Management history:  Previous antiarrhythmic drugs: None Previous cardioversions: 2019 Previous ablations: None Anticoagulation history: Eliquis 5 mg BID   Past Medical History:  Diagnosis Date   Atrial fibrillation (HCC)    Polysubstance abuse (HCC)    Tobacco abuse    Past Surgical History:  Procedure Laterality Date   TEE WITHOUT CARDIOVERSION N/A 12/13/2017   Procedure: TRANSESOPHAGEAL ECHOCARDIOGRAM (TEE);  Surgeon: Chrystie Nose, MD;  Location: [facility] ENDOSCOPY;  Service: Cardiovascular;  Laterality: N/A;    Current Outpatient Medications  Medication Sig Dispense Refill   diltiazem (CARDIZEM SR) 120 MG 12 hr capsule Take 1 capsule (120 mg total) by mouth daily. 30 capsule 1   No current facility-administered medications for this encounter.  No Active Allergies  Social History   Socioeconomic History   Marital status: Single    Spouse name: Not on file   Number of children: Not on file   Years of education: Not on file   Highest education level: Not on file  Occupational History   Not on file  Tobacco Use   Smoking  status: Every Day    Types: Cigarettes   Smokeless tobacco: Never  Vaping Use   Vaping Use: Never used  Substance and Sexual Activity   Alcohol use: Yes    Alcohol/week: 2.0 standard drinks of alcohol    Types: 2 Standard drinks or equivalent per week   Drug use: Yes    Types: Marijuana    Comment: per patient smokes 3x a week   Sexual activity: Yes  Other Topics Concern   Not on file  Social History Narrative   ** Merged History Encounter **       Social Determinants of Health   Financial Resource Strain: Not on file  Food Insecurity: No Food Insecurity (10/15/2022)   Hunger Vital Sign    Worried About Running Out of Food in the Last Year: Never true    Ran Out of Food in the Last Year: Never true  Transportation Needs: No Transportation Needs (10/15/2022)   PRAPARE - Administrator, Civil Service (Medical): No    Lack of Transportation (Non-Medical): No  Physical Activity: Not on file  Stress: Not on file  Social Connections: Not on file  Intimate Partner Violence: Not At Risk (10/15/2022)   Humiliation, Afraid, Rape, and Kick questionnaire    Fear of Current or Ex-Partner: No    Emotionally Abused: No    Physically Abused: No    Sexually Abused: No     ROS- All systems are reviewed and negative except as per the HPI above.  Physical Exam: Vitals:   12/08/22 1357  BP: 126/84  Pulse: 89  Weight: 73 kg  Height: 6\' 2"  (1.88 m)    GEN- The patient is well appearing, alert and oriented x 3 today.   Head- normocephalic, atraumatic Eyes-  Sclera clear, conjunctiva pink Ears- hearing intact Lungs- Clear to ausculation bilaterally, normal work of breathing Heart- Regular rate and rhythm, no murmurs, rubs or gallops, PMI not laterally displaced Extremities- no clubbing, cyanosis, or edema MS- no significant deformity or atrophy Skin- no rash or lesion Psych- euthymic mood, full affect Neuro- strength and sensation are intact   Wt Readings from Last 3  Encounters:  12/08/22 73 kg  10/29/22 70.6 kg  10/14/22 73 kg    EKG today demonstrates  Vent. rate 89 BPM PR interval 122 ms QRS duration 88 ms QT/QTcB 350/425 ms P-R-T axes 81 71 20 Normal sinus rhythm Biatrial enlargement Abnormal ECG When compared with ECG of 29-Oct-2022 13:34, PREVIOUS ECG IS PRESENT  Echo 10/15/22 demonstrated:  1. Left ventricular ejection fraction, by estimation, is 55 to 60%. Left  ventricular ejection fraction by 2D MOD biplane is 59.4 %. The left  ventricle has normal function. The left ventricle has no regional wall  motion abnormalities. There is mild left  ventricular hypertrophy. Left ventricular diastolic function could not be  evaluated.   2. Right ventricular systolic function is normal. The right ventricular  size is mildly enlarged.   3. The mitral valve is grossly normal. Trivial mitral valve  regurgitation.   4. The aortic valve is tricuspid. Aortic valve regurgitation is not  visualized. No  aortic stenosis is present.   5. The inferior vena cava is dilated in size with <50% respiratory  variability, suggesting right atrial pressure of 15 mmHg.  Epic records are reviewed at length today.  CHA2DS2-VASc Score = 0  The patient's score is based upon: CHF History: 0 HTN History: 0 Diabetes History: 0 Stroke History: 0 Vascular Disease History: 0 Age Score: 0 Gender Score: 0      ASSESSMENT AND PLAN: Paroxysmal Atrial Fibrillation (ICD10:  I48.0) The patient's CHA2DS2-VASc score is 0, indicating a 0.2% annual risk of stroke.    He is in SR today.  After discussion, will proceed with the following plan:  - Stop Eliquis - Stop diltiazem. I will send a prescription so he has on hand in case. - F/u 6 months. - Recommended rhythm monitoring device.    Follow up in 6 months.    Lake Bells, PA-C Afib Clinic [facility] [address] [place], [place] [address] [phone] 12/08/2022 2:23 PM

## 2022-12-17 ENCOUNTER — Other Ambulatory Visit (HOSPITAL_COMMUNITY): Payer: Self-pay

## 2022-12-29 ENCOUNTER — Other Ambulatory Visit (HOSPITAL_COMMUNITY): Payer: Self-pay

## 2022-12-30 ENCOUNTER — Other Ambulatory Visit (HOSPITAL_COMMUNITY): Payer: Self-pay

## 2023-01-01 ENCOUNTER — Other Ambulatory Visit (HOSPITAL_COMMUNITY): Payer: Self-pay

## 2023-01-04 ENCOUNTER — Other Ambulatory Visit (HOSPITAL_COMMUNITY): Payer: Self-pay

## 2023-06-09 ENCOUNTER — Ambulatory Visit (HOSPITAL_COMMUNITY): Payer: Commercial Managed Care - HMO | Admitting: Internal Medicine

## 2023-06-09 NOTE — Progress Notes (Incomplete)
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 Remote history of Afib back in 2019 and underwent successful DCCV at that time. TEE done 12/13/17 showed PFO. History of hospitalization in January 2021 for acute respiratory failure and hypoxemia complicated by cardiac arrest and aspiration pneumonia; reportedly doing cocaine at that time. The patient was hospitalized 4/17-20/2024 due to Afib with RVR presenting with symptoms of heart palpitations, dizziness, and shortness of breath. He was hypotensive despite rehydration and had elevated creatinine at admission (improved upon discharge). He was discharged on Eliquis 5 mg BID for unknown duration of Afib and diltiazem 120 mg BID. Patient is on Eliquis 5 mg for a CHADS2VASC score of 0.  On evaluation today, he is in SR. He felt the spontaneous conversion to SR sometime last week. He has been compliant with his medications. He is here with his mother today. No chest pain, shortness of breath, swelling, or dizziness. He admits that about a week before hospital admission he felt brief fluttering but it resolved. This was around the time he used cocaine. He states on his birthday (day of admission) he had a THC gummy, a joint, and a beer. He notes after the joint and beer he felt his heart fluttering fast. He currently denies any chest pain or shortness of breath and feels well. He denies any dizziness. He does not have a PCP.   He is compliant with anticoagulation and has not missed any doses. He has no bleeding concerns.  On follow up 12/08/22, he is in NSR. Since last office visit, he has felt well. No  episodes of Afib since last office visit.    On follow up 06/09/23, he is currently in ***. He has had *** episodes of Afib since last office visit. He has previously stopped rate control and anticoagulation due to Chads of zero and low burden.   Today, he denies symptoms of palpitations, chest pain, shortness of breath, orthopnea, PND, lower extremity edema, dizziness, presyncope, syncope, snoring, daytime somnolence, bleeding, or neurologic sequela. The patient is tolerating medications without difficulties and is otherwise without complaint today.   Atrial Fibrillation Risk Factors:  he does not have symptoms or diagnosis of sleep apnea. he does not have a history of rheumatic fever. he does have a history of alcohol use. The patient does not have a history of early familial atrial fibrillation or other arrhythmias.  he has a BMI of There is no height or weight on file to calculate BMI.. There were no vitals filed for this visit.    Family History  Problem Relation Age of Onset   Hypertension Mother    Hypertension Father     Atrial Fibrillation Management history:  Previous antiarrhythmic drugs: None Previous cardioversions: 2019 Previous ablations: None Anticoagulation history: Eliquis 5 mg BID   Past Medical History:  Diagnosis Date   Atrial fibrillation (HCC)    Polysubstance abuse (HCC)    Tobacco abuse    Past Surgical History:  Procedure Laterality Date   TEE WITHOUT CARDIOVERSION N/A 12/13/2017   Procedure: TRANSESOPHAGEAL ECHOCARDIOGRAM (TEE);  Surgeon: Chrystie Nose, MD;  Location: [facility] ENDOSCOPY;  Service: Cardiovascular;  Laterality: N/A;    Current  Outpatient Medications  Medication Sig Dispense Refill   diltiazem (CARDIZEM SR) 120 MG 12 hr capsule Take 1 capsule (120 mg total) by mouth daily. 30 capsule 1   No current facility-administered medications for this visit.    No Active Allergies  ROS- All systems are reviewed and negative except as per  the HPI above.  Physical Exam: There were no vitals filed for this visit.  GEN- The patient is well appearing, alert and oriented x 3 today.   Neck - no JVD or carotid bruit noted Lungs- Clear to ausculation bilaterally, normal work of breathing Heart- ***Regular rate and rhythm, no murmurs, rubs or gallops, PMI not laterally displaced Extremities- no clubbing, cyanosis, or edema Skin - no rash or ecchymosis noted   Wt Readings from Last 3 Encounters:  12/08/22 73 kg  10/29/22 70.6 kg  10/14/22 73 kg    EKG today demonstrates  ***  Echo 10/15/22 demonstrated:  1. Left ventricular ejection fraction, by estimation, is 55 to 60%. Left  ventricular ejection fraction by 2D MOD biplane is 59.4 %. The left  ventricle has normal function. The left ventricle has no regional wall  motion abnormalities. There is mild left  ventricular hypertrophy. Left ventricular diastolic function could not be  evaluated.   2. Right ventricular systolic function is normal. The right ventricular  size is mildly enlarged.   3. The mitral valve is grossly normal. Trivial mitral valve  regurgitation.   4. The aortic valve is tricuspid. Aortic valve regurgitation is not  visualized. No aortic stenosis is present.   5. The inferior vena cava is dilated in size with <50% respiratory  variability, suggesting right atrial pressure of 15 mmHg.  Epic records are reviewed at length today.  CHA2DS2-VASc Score = 0  The patient's score is based upon: CHF History: 0 HTN History: 0 Diabetes History: 0 Stroke History: 0 Vascular Disease History: 0 Age Score: 0 Gender Score: 0      ASSESSMENT AND PLAN: Paroxysmal Atrial Fibrillation (ICD10:  I48.0) The patient's CHA2DS2-VASc score is 0, indicating a 0.2% annual risk of stroke.    He is currently in ***.  After discussion, will proceed with the following plan:  - Stop Eliquis - Stop diltiazem. I will send a prescription so he has on hand in case. - F/u  6 months. - Recommended rhythm monitoring device.    Follow up in 6 months.    Lake Bells, PA-C Afib Clinic [facility] [address] [place], [place] [address] [phone] 06/09/2023 9:04 AM
